# Patient Record
Sex: Female | Born: 1935 | Race: White | Hispanic: No | Smoking: Never smoker
Health system: Southern US, Community
[De-identification: ages and names within clinical notes are randomized; demographics above are authoritative.]

## PROBLEM LIST (undated history)

## (undated) DIAGNOSIS — M810 Age-related osteoporosis without current pathological fracture: Secondary | ICD-10-CM

## (undated) DIAGNOSIS — E119 Type 2 diabetes mellitus without complications: Secondary | ICD-10-CM

## (undated) DIAGNOSIS — I7100 Dissection of unspecified site of aorta: Secondary | ICD-10-CM

## (undated) DIAGNOSIS — J449 Chronic obstructive pulmonary disease, unspecified: Secondary | ICD-10-CM

## (undated) DIAGNOSIS — I10 Essential (primary) hypertension: Secondary | ICD-10-CM

## (undated) DIAGNOSIS — E785 Hyperlipidemia, unspecified: Secondary | ICD-10-CM

## (undated) DIAGNOSIS — N3020 Other chronic cystitis without hematuria: Secondary | ICD-10-CM

## (undated) DIAGNOSIS — I700 Atherosclerosis of aorta: Secondary | ICD-10-CM

## (undated) HISTORY — DX: Essential (primary) hypertension: I10

## (undated) HISTORY — DX: Type 2 diabetes mellitus without complications: E11.9

## (undated) HISTORY — DX: Dissection of unspecified site of aorta: I71.00

## (undated) HISTORY — DX: Chronic obstructive pulmonary disease, unspecified: J44.9

## (undated) HISTORY — PX: OTHER SURGICAL HISTORY: SHX169

## (undated) HISTORY — DX: Hyperlipidemia, unspecified: E78.5

## (undated) HISTORY — DX: Atherosclerosis of aorta: I70.0

## (undated) HISTORY — DX: Age-related osteoporosis without current pathological fracture: M81.0

## (undated) HISTORY — DX: Other chronic cystitis without hematuria: N30.20

---

## 1998-03-16 ENCOUNTER — Other Ambulatory Visit: Admission: RE | Admit: 1998-03-16 | Discharge: 1998-03-16 | Payer: Self-pay | Admitting: Obstetrics and Gynecology

## 2000-09-15 ENCOUNTER — Ambulatory Visit (HOSPITAL_COMMUNITY): Admission: RE | Admit: 2000-09-15 | Discharge: 2000-09-15 | Payer: Self-pay | Admitting: Family Medicine

## 2001-05-21 ENCOUNTER — Other Ambulatory Visit: Admission: RE | Admit: 2001-05-21 | Discharge: 2001-05-21 | Payer: Self-pay | Admitting: Obstetrics and Gynecology

## 2001-10-11 ENCOUNTER — Ambulatory Visit (HOSPITAL_COMMUNITY): Admission: RE | Admit: 2001-10-11 | Discharge: 2001-10-11 | Payer: Self-pay | Admitting: Gastroenterology

## 2003-08-25 ENCOUNTER — Encounter: Admission: RE | Admit: 2003-08-25 | Discharge: 2003-08-25 | Payer: Self-pay | Admitting: Family Medicine

## 2003-10-28 ENCOUNTER — Encounter: Admission: RE | Admit: 2003-10-28 | Discharge: 2003-10-28 | Payer: Self-pay | Admitting: Family Medicine

## 2004-06-15 ENCOUNTER — Ambulatory Visit (HOSPITAL_COMMUNITY): Admission: RE | Admit: 2004-06-15 | Discharge: 2004-06-15 | Payer: Self-pay | Admitting: Chiropractic Medicine

## 2005-10-04 ENCOUNTER — Encounter: Admission: RE | Admit: 2005-10-04 | Discharge: 2005-10-04 | Payer: Self-pay | Admitting: Gastroenterology

## 2006-07-28 ENCOUNTER — Encounter: Admission: RE | Admit: 2006-07-28 | Discharge: 2006-07-28 | Payer: Self-pay | Admitting: Family Medicine

## 2006-08-08 ENCOUNTER — Encounter: Admission: RE | Admit: 2006-08-08 | Discharge: 2006-08-08 | Payer: Self-pay | Admitting: Family Medicine

## 2006-08-15 ENCOUNTER — Encounter: Admission: RE | Admit: 2006-08-15 | Discharge: 2006-08-15 | Payer: Self-pay | Admitting: Family Medicine

## 2006-09-01 ENCOUNTER — Ambulatory Visit: Payer: Self-pay | Admitting: Cardiothoracic Surgery

## 2006-09-11 ENCOUNTER — Ambulatory Visit (HOSPITAL_COMMUNITY): Admission: RE | Admit: 2006-09-11 | Discharge: 2006-09-11 | Payer: Self-pay | Admitting: Cardiothoracic Surgery

## 2006-09-18 ENCOUNTER — Ambulatory Visit: Payer: Self-pay | Admitting: Cardiothoracic Surgery

## 2006-10-05 ENCOUNTER — Encounter: Admission: RE | Admit: 2006-10-05 | Discharge: 2006-10-05 | Payer: Self-pay | Admitting: Cardiovascular Disease

## 2006-10-06 ENCOUNTER — Inpatient Hospital Stay (HOSPITAL_COMMUNITY): Admission: EM | Admit: 2006-10-06 | Discharge: 2006-10-16 | Payer: Self-pay | Admitting: Emergency Medicine

## 2006-10-06 ENCOUNTER — Ambulatory Visit: Payer: Self-pay | Admitting: Cardiothoracic Surgery

## 2006-10-09 ENCOUNTER — Encounter (INDEPENDENT_AMBULATORY_CARE_PROVIDER_SITE_OTHER): Payer: Self-pay | Admitting: Cardiovascular Disease

## 2006-10-10 ENCOUNTER — Encounter (INDEPENDENT_AMBULATORY_CARE_PROVIDER_SITE_OTHER): Payer: Self-pay | Admitting: Specialist

## 2006-10-20 ENCOUNTER — Ambulatory Visit: Payer: Self-pay | Admitting: Cardiothoracic Surgery

## 2006-11-03 ENCOUNTER — Ambulatory Visit: Payer: Self-pay | Admitting: Vascular Surgery

## 2006-11-21 ENCOUNTER — Encounter: Admission: RE | Admit: 2006-11-21 | Discharge: 2006-11-21 | Payer: Self-pay | Admitting: Cardiothoracic Surgery

## 2006-11-24 ENCOUNTER — Ambulatory Visit: Payer: Self-pay | Admitting: Cardiothoracic Surgery

## 2006-12-07 ENCOUNTER — Inpatient Hospital Stay (HOSPITAL_COMMUNITY): Admission: EM | Admit: 2006-12-07 | Discharge: 2006-12-08 | Payer: Self-pay | Admitting: Emergency Medicine

## 2006-12-12 ENCOUNTER — Emergency Department (HOSPITAL_COMMUNITY): Admission: EM | Admit: 2006-12-12 | Discharge: 2006-12-13 | Payer: Self-pay | Admitting: *Deleted

## 2007-04-06 ENCOUNTER — Ambulatory Visit: Payer: Self-pay | Admitting: Cardiothoracic Surgery

## 2007-04-06 ENCOUNTER — Encounter: Admission: RE | Admit: 2007-04-06 | Discharge: 2007-04-06 | Payer: Self-pay | Admitting: Cardiothoracic Surgery

## 2007-07-18 ENCOUNTER — Ambulatory Visit: Payer: Self-pay | Admitting: Vascular Surgery

## 2007-07-25 ENCOUNTER — Encounter: Admission: RE | Admit: 2007-07-25 | Discharge: 2007-07-25 | Payer: Self-pay | Admitting: Vascular Surgery

## 2007-07-25 ENCOUNTER — Ambulatory Visit: Payer: Self-pay | Admitting: Vascular Surgery

## 2007-10-12 ENCOUNTER — Encounter: Admission: RE | Admit: 2007-10-12 | Discharge: 2007-10-12 | Payer: Self-pay | Admitting: Cardiothoracic Surgery

## 2007-10-19 ENCOUNTER — Ambulatory Visit: Payer: Self-pay | Admitting: Cardiothoracic Surgery

## 2008-03-27 ENCOUNTER — Encounter (INDEPENDENT_AMBULATORY_CARE_PROVIDER_SITE_OTHER): Payer: Self-pay | Admitting: Orthopedic Surgery

## 2008-03-27 ENCOUNTER — Ambulatory Visit (HOSPITAL_BASED_OUTPATIENT_CLINIC_OR_DEPARTMENT_OTHER): Admission: RE | Admit: 2008-03-27 | Discharge: 2008-03-27 | Payer: Self-pay | Admitting: Orthopedic Surgery

## 2008-04-14 ENCOUNTER — Ambulatory Visit: Payer: Self-pay | Admitting: *Deleted

## 2008-04-14 ENCOUNTER — Ambulatory Visit: Admission: RE | Admit: 2008-04-14 | Discharge: 2008-04-14 | Payer: Self-pay | Admitting: Orthopedic Surgery

## 2008-04-14 ENCOUNTER — Encounter (INDEPENDENT_AMBULATORY_CARE_PROVIDER_SITE_OTHER): Payer: Self-pay | Admitting: Orthopedic Surgery

## 2008-11-07 ENCOUNTER — Encounter: Admission: RE | Admit: 2008-11-07 | Discharge: 2008-11-07 | Payer: Self-pay | Admitting: Cardiothoracic Surgery

## 2008-11-10 ENCOUNTER — Ambulatory Visit: Payer: Self-pay | Admitting: Cardiothoracic Surgery

## 2009-04-18 ENCOUNTER — Inpatient Hospital Stay (HOSPITAL_COMMUNITY): Admission: EM | Admit: 2009-04-18 | Discharge: 2009-04-20 | Payer: Self-pay | Admitting: Cardiovascular Disease

## 2009-04-18 ENCOUNTER — Ambulatory Visit: Payer: Self-pay | Admitting: Thoracic Surgery (Cardiothoracic Vascular Surgery)

## 2009-04-18 ENCOUNTER — Encounter: Payer: Self-pay | Admitting: Emergency Medicine

## 2009-05-15 ENCOUNTER — Ambulatory Visit: Payer: Self-pay | Admitting: Cardiothoracic Surgery

## 2009-05-15 ENCOUNTER — Encounter: Admission: RE | Admit: 2009-05-15 | Discharge: 2009-05-15 | Payer: Self-pay | Admitting: Cardiovascular Disease

## 2009-10-28 ENCOUNTER — Encounter
Admission: RE | Admit: 2009-10-28 | Discharge: 2009-10-28 | Payer: Self-pay | Source: Home / Self Care | Admitting: Cardiothoracic Surgery

## 2009-10-29 ENCOUNTER — Ambulatory Visit: Payer: Self-pay | Admitting: Cardiothoracic Surgery

## 2010-06-17 ENCOUNTER — Emergency Department (HOSPITAL_COMMUNITY)
Admission: EM | Admit: 2010-06-17 | Discharge: 2010-06-17 | Payer: Self-pay | Source: Home / Self Care | Admitting: Emergency Medicine

## 2010-07-17 ENCOUNTER — Encounter: Payer: Self-pay | Admitting: Chiropractic Medicine

## 2010-07-18 ENCOUNTER — Encounter: Payer: Self-pay | Admitting: Cardiothoracic Surgery

## 2010-09-30 LAB — CBC
HCT: 38.1 % (ref 36.0–46.0)
HCT: 38.8 % (ref 36.0–46.0)
Hemoglobin: 13.2 g/dL (ref 12.0–15.0)
Hemoglobin: 13.6 g/dL (ref 12.0–15.0)
Hemoglobin: 14.9 g/dL (ref 12.0–15.0)
MCHC: 34 g/dL (ref 30.0–36.0)
MCHC: 34.7 g/dL (ref 30.0–36.0)
Platelets: 158 10*3/uL (ref 150–400)
Platelets: 196 10*3/uL (ref 150–400)
RDW: 13.1 % (ref 11.5–15.5)
RDW: 13.4 % (ref 11.5–15.5)
WBC: 8.5 10*3/uL (ref 4.0–10.5)

## 2010-09-30 LAB — HEMOGLOBIN A1C
Hgb A1c MFr Bld: 6.1 % (ref 4.6–6.1)
Mean Plasma Glucose: 128 mg/dL

## 2010-09-30 LAB — DIFFERENTIAL
Basophils Absolute: 0 10*3/uL (ref 0.0–0.1)
Eosinophils Absolute: 0.4 10*3/uL (ref 0.0–0.7)
Eosinophils Relative: 3 % (ref 0–5)
Lymphocytes Relative: 29 % (ref 12–46)
Monocytes Absolute: 1.1 10*3/uL — ABNORMAL HIGH (ref 0.1–1.0)

## 2010-09-30 LAB — BASIC METABOLIC PANEL
BUN: 19 mg/dL (ref 6–23)
CO2: 25 mEq/L (ref 19–32)
Calcium: 9.2 mg/dL (ref 8.4–10.5)
Creatinine, Ser: 0.71 mg/dL (ref 0.4–1.2)
GFR calc non Af Amer: >60 mL/min (ref >60)
Glucose, Bld: 119 mg/dL — ABNORMAL HIGH (ref 70–99)
Sodium: 133 mEq/L — ABNORMAL LOW (ref 135–145)

## 2010-09-30 LAB — HEPARIN LEVEL (UNFRACTIONATED): Heparin Unfractionated: 0.5 IU/mL (ref 0.30–0.70)

## 2010-09-30 LAB — CK TOTAL AND CKMB (NOT AT ARMC)
CK, MB: 1.7 ng/mL (ref 0.3–4.0)
Total CK: 69 U/L (ref 7–177)

## 2010-09-30 LAB — COMPREHENSIVE METABOLIC PANEL
ALT: 18 U/L (ref 0–35)
Albumin: 3.2 g/dL — ABNORMAL LOW (ref 3.5–5.2)
Alkaline Phosphatase: 44 U/L (ref 39–117)
BUN: 14 mg/dL (ref 6–23)
Chloride: 101 mEq/L (ref 96–112)
Glucose, Bld: 121 mg/dL — ABNORMAL HIGH (ref 70–99)
Potassium: 4 mEq/L (ref 3.5–5.1)
Sodium: 137 mEq/L (ref 135–145)
Total Bilirubin: 0.8 mg/dL (ref 0.3–1.2)
Total Protein: 6.6 g/dL (ref 6.0–8.3)

## 2010-09-30 LAB — BRAIN NATRIURETIC PEPTIDE: Pro B Natriuretic peptide (BNP): 104 pg/mL — ABNORMAL HIGH (ref 0.0–100.0)

## 2010-09-30 LAB — URINALYSIS, ROUTINE W REFLEX MICROSCOPIC
Nitrite: NEGATIVE
Specific Gravity, Urine: 1.012 (ref 1.005–1.030)
Urobilinogen, UA: 0.2 mg/dL (ref 0.0–1.0)
pH: 7 (ref 5.0–8.0)

## 2010-09-30 LAB — URINE MICROSCOPIC-ADD ON

## 2010-09-30 LAB — CARDIAC PANEL(CRET KIN+CKTOT+MB+TROPI)
CK, MB: 1 ng/mL (ref 0.3–4.0)
CK, MB: 1.4 ng/mL (ref 0.3–4.0)
Relative Index: INVALID (ref 0.0–2.5)
Total CK: 45 U/L (ref 7–177)
Troponin I: 0.01 ng/mL (ref 0.00–0.06)
Troponin I: 0.02 ng/mL (ref 0.00–0.06)

## 2010-09-30 LAB — LIPID PANEL
HDL: 51 mg/dL (ref >39)
Total CHOL/HDL Ratio: 3.3 RATIO
VLDL: 31 mg/dL (ref 0–40)

## 2010-09-30 LAB — SEDIMENTATION RATE: Sed Rate: 26 mm/hr — ABNORMAL HIGH (ref 0–22)

## 2010-09-30 LAB — PROTIME-INR: Prothrombin Time: 12 seconds (ref 11.6–15.2)

## 2010-09-30 LAB — C-REACTIVE PROTEIN: CRP: 3.3 mg/dL — ABNORMAL HIGH (ref <0.6)

## 2010-09-30 LAB — TROPONIN I: Troponin I: 0.01 ng/mL (ref 0.00–0.06)

## 2010-10-01 ENCOUNTER — Other Ambulatory Visit: Payer: Self-pay | Admitting: Cardiothoracic Surgery

## 2010-10-01 DIAGNOSIS — I712 Thoracic aortic aneurysm, without rupture: Secondary | ICD-10-CM

## 2010-11-03 ENCOUNTER — Ambulatory Visit
Admission: RE | Admit: 2010-11-03 | Discharge: 2010-11-03 | Disposition: A | Discharge status: Home / Self Care | Payer: Medicare Other | Source: Ambulatory Visit | Attending: Cardiothoracic Surgery | Admitting: Cardiothoracic Surgery

## 2010-11-03 ENCOUNTER — Ambulatory Visit (INDEPENDENT_AMBULATORY_CARE_PROVIDER_SITE_OTHER): Payer: Medicare Other | Admitting: Cardiothoracic Surgery

## 2010-11-03 DIAGNOSIS — I712 Thoracic aortic aneurysm, without rupture: Secondary | ICD-10-CM

## 2010-11-03 MED ORDER — IOHEXOL 300 MG/ML  SOLN
100.0000 mL | Freq: Once | INTRAMUSCULAR | Status: AC | PRN
  Administered 2010-11-03: 100 mL via INTRAVENOUS

## 2010-11-04 NOTE — Assessment & Plan Note (Signed)
OFFICE VISIT  [name], [name] DOB:  1923-11-22                                        Nov 03, 2010 CHART #:  [id number]  CURRENT PROBLEMS: 1. Status post resection of a 6-cm ascending thoracic aneurysm with     Hemashield graft and hemiarch reconstruction with preservation of     aortic valve, April 2008. 2. 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 Kellie Simmering. 3. Type [name] intramural hematoma of the descending thoracic aorta,     October 2010, now resolved. 4. Hypertension.  PRESENT ILLNESS:  The patient is a 77 year old very nice lady who returns for follow up of CT angiogram of her thoracic aorta after undergoing the above thoracic aortic repair for a large ascending aneurysm.  She is doing fairly well at home, but has arthritis and recently injured her left shoulder rotator cuff up.  Blood pressure has been under fairly good control.  She is stopped the prednisone under the direction of Dr. Kellie Simmering for her giant cell arteritis and has lost of significant weight.  She has some issues with ankle swelling and otherwise takes Lipitor, 81 mg of aspirin, fish oil, Lasix 20 mg, metoprolol 50 mg a day, and WelChol.  PHYSICAL EXAMINATION:  Vital Signs:  Her blood pressure is 140/70, pulse 72 and regular, respirations 18 and regular, saturation 97%.  Lungs: Breath sounds are clear and equal.  Cardiac:  Rhythm is regular without murmur.  The sternal incision is well healed and she has no significant pedal edema.  The aortic valve sounds to be competent without murmur.  A CT angiogram shows her thoracic aortic appeared to be intact.  No evidence of pseudoaneurysm or new aneurysmal disease of the ascending arch or descending thoracic aorta.  PLAN:  The patient is now 4 years after surgery, doing well.  Further surveillance CT scans I feel are necessary, would only entail more radiation.  Blood pressure control and p.r.n. followup here  are recommended.  Kerin Perna, M.D. Electronically Signed  PV/MEDQ  D:  11/03/2010  T:  11/04/2010  Job:  [id number]  cc:   Aundra Dubin, M.D. Sandi Carne, MD

## 2010-11-09 NOTE — Discharge Summary (Signed)
[name], [name]                ACCOUNT NO.:  [id number]   MEDICAL RECORD NO.:  [id number]          PATIENT TYPE:  INP   LOCATION:  1426                         FACILITY:  [facility]   PHYSICIAN:  [name], MDDATE OF BIRTH:  [birth year]   DATE OF ADMISSION:  12/06/2006  DATE OF DISCHARGE:  12/08/2006                               DISCHARGE SUMMARY   DISCHARGE DIAGNOSES:  1. Urinary tract infection.  2. Dehydration.  3. Hypokalemia.  4. Hyponatremia.  5. Hypertension.  6. Mild sinus bradycardia, resolved.  7. History of atrial fibrillation.  8. Giant cell aortitis with history of resection of an ascending      aortic aneurysm with graft and hemiarch reconstruction.  9. Recently diagnosed diabetes, not on medication.  10.Hyperlipidemia.   DISCHARGE MEDICATIONS:  1. Levaquin 500 mg orally daily for five more days.  2. The patient may continue her outpatient medications; please see      admitting history and physical for complete details.   DISCHARGE CONDITION:  Condition is stable.   CONSULTATIONS:  None.   PROCEDURE:  None.   DIET:  Low-cholesterol with adequate oral intake.   FOLLOW UP:  Follow up is with Dr. Donia Guiles in twp weeks for  routine hospital follow up.   LABORATORY DATA:  Pertinent labs:  CBC on admission was unremarkable,  but the following day her white blood cell count was 12,000.  Sodium  130, potassium 3.3, chloride 95, bicarbonate 20, glucose 168, BUN 22,  and creatinine 0.87.  Albumin 2.7.  Otherwise unremarkable comprehensive  metabolic panel.  UA showed cloudy urine, specific gravity 1.015 with 40  ketones, small blood, 30 protein, nitrite positive, trace leukocyte  esterase, 7-10 white cells, 3-6 red cells, and many bacteria.  Hemoccult  of the stool was negative.  Blood cultures negative to date.  Urine  culture negative to date.   HISTORY AND HOSPITAL COURSE:  The patient was a pleasant 77 year old  white female who presented to  the emergency room with generalized  malaise, subjective fevers,  and rigors.  She had not been eating well.  She had been nauseated, but not vomiting.  She lives alone.  She denied  any dysuria or frequency.  She was on prednisone for her giant cell  aortitis.   Initial temperature was 102.7.  Heart rate on admission was 57 and her  blood pressure initially was 170/72, but dropped into the 90s systolic  the day after admission and her metoprolol was held.  She was to have a  urinary tract infection and was started on Rocephin.  Cultures to date  have been negative.  She, at the time of discharge, was feeling much  better.  Her blood pressure and heart rate normalized; and, she  requested to go home.  She is tolerating a regular diet.      Mikayla L. Lendell [name], MD  Electronically Signed     CLS/MEDQ  D:  12/08/2006  T:  12/09/2006  Job:  [id number]

## 2010-11-09 NOTE — Assessment & Plan Note (Signed)
OFFICE VISIT   Mikayla [name], Mikayla [name]  DOB:  July 26, 1923                                        Oct 29, 2009  CHART #:  [id number]   CURRENT PROBLEMS:  1. Status post resection of a large ascending thoracic aortic aneurysm      with a Hemashield graft hemiarch reconstruction and preservation of      the aortic valve, April 2008.  2. Type [name] intramural hematoma of the descending thoracic aorta,      October 2010, now resolved.  3. Giant cell aortitis of the thoracic aorta by pathology of resected      specimen, on intermittent prednisone followed by Dr. Kellie Simmering.  4. Hypertension.   PRESENT ILLNESS:  The patient is an 77 year old Caucasian hypertensive  female, returns for 67-month followup.  She was hospitalized last October  for a intramural hematoma of the descending thoracic aorta, which was  probably the result of a penetrating ulcer in a short segment  dissection.  Fortunately, this has resolved on serial CT scans, the last  of which was performed yesterday.  Her ascending aortic graft is intact  and the size of her ascending aorta arch and descending thoracic aorta  are normal.  She is followed carefully by Dr. Kellie Simmering for her giant cell  arteritis and recently (3 weeks ago) discontinued low-dose prednisone.  She has been able to lose some weight since that time.  She is having  difficulty with the left knee arthritis and received a steroid injection  by Dr. Renae Fickle and is able to walk short distances.  Her blood pressure  remains under fairly good control, although since her last visit, her  metoprolol dose has been increased from 25 to 50 mg p.o. [name].i.d. and her  Lipitor has been increased from 10 to 20 mg [name].i.d.  She remains on  Lasix, aspirin 81 mg, fish oil, and WelChol.   PHYSICAL EXAMINATION:  Blood pressure 145/80, pulse 67, respirations 18,  and saturation 95% on room air.  She is a very strong-appearing elderly  female, who has slight cushingoid  features.  Her neck is without JVD and  palpable carotid pulses are present.  Her sternal incision is well  healed and she has a normal-sounding aortic valve closure without  murmur.  Peripheral pulses are 2+ in her upper and lower extremities.  She has no pulsatile mass in her abdomen.   LABORATORY DATA:  Her CT scan of the chest shows resolution of the  previously described descending thoracic aortic intramural hematoma.  The ascending aortic graft is intact.  She has some atheromatous changes  of the abdominal aorta, but no aneurysm.  The patient has small  gallstones also noted on the abdominal portion in her CT scan.   IMPRESSION AND PLAN:  The patient's thoracic aortic disease is stable on  her beta-blocker, statin, and aspirin.  I have encouraged her to enter  into a low-impact exercise program with a reclining bicycle or water,  exercise to help with her weight loss, and maintain her conditioning.  I  will plan on seeing her back in 1 year with a CT scan of the thoracic  aorta.  Hopefully, she will be able to maintain her vascular health  without prednisone and the associated weight gain, which has been  problematic.   Mikayla Nations  Maudie [name], M.D.  Electronically Signed   PV/MEDQ  D:  10/29/2009  T:  10/30/2009  Job:  [id number]   cc:   Mikayla Dubin, MD  Mikayla Carne, MD

## 2010-11-09 NOTE — Assessment & Plan Note (Signed)
OFFICE VISIT   [name], [name]  DOB:  [birth year]                                        May 15, 2009  CHART #:  [id number]   CURRENT PROBLEMS:  1. Status post resection of a large ascending arch aneurysm with a      Hemashield graft in April 2008.  2. 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 Kellie Simmering.  3. Type [name] intramural hematoma of the descending thoracic aorta,      October 2010.  4. Hypertension.   PRESENT ILLNESS:  The patient returns for followup of her thoracic  aortic disease.  Her ascending aneurysm was replaced with a Hemashield  graft using circulatory arrest in 2010.  She has been on prednisone and  followed carefully by Dr. Kellie Simmering.  She has been on beta-blocker  chronically.  In October of this year, she presented with acute  epigastric pain and severe hypertension.  Her sed rate was 24.  She had  a new intramural hematoma of the descending thoracic aorta with an  apparent penetrating ulcer.  Fortunately, her symptoms resolved and she  had no complications from the penetrating ulcer and this did not develop  into a full dissection.  She has been home now for a few weeks without  severe hypertension and she has been limiting her activity levels.  She  was placed back on prednisone by Dr. Kellie Simmering.  She still has some  epigastric discomfort after meals but this has improved especially with  a eating more frequent smaller meal.  She denies any exertional chest  pain or epigastric pain during activities such as walking or going  upstairs.  Her coronaries were without significant disease when she was  cathed by Dr. Allyson Sabal in 2008.  She is maintaining sinus rhythm, and she  continues on her medications including aspirin 81 mg, fish oil, WelChol,  metoprolol 50 mg daily, and Lasix 40 mg daily.   PHYSICAL EXAMINATION:  Blood pressure 146/70, pulse 65, respirations 18,  saturation 96% on room air.   She is alert and comfortable.  Breath  sounds are clear.  Cardiac rhythm is regular without murmur.  Previous  echoes have shown no evidence of significant aortic valvular disease.  She has good distal pulses and no neurologic deficit.  She has nontender  abdomen without palpable pulsatile mass.   LABORATORY DATA:  CT angiogram is repeated today.  The study has not  been officially interpreted, but the penetrating ulcer seems to have  resolved and the periaortic hematoma is also slightly improved.  There  is no evidence of true dissection or false lumen.  There is no  significant pleural effusion.   IMPRESSION AND PLAN:  The patient has a descending thoracic aortic  hematoma from a small penetrating ulcer.  This has responded well to  medical therapy.  I told her she could start doing some increased  activity but not lifting anything more than 20 pounds.  She is a very  active woman.  She will remain on her current medications and her  prednisone will be directed by Dr. Kellie Simmering.  I plan on seeing her back  in approximately 6 months with a CT scan to assess her thoracic aortic  disease.   [facility] [employer] [facility],  M.D.  Electronically Signed   PV/MEDQ  D:  05/15/2009  T:  05/16/2009  Job:  [id number]   cc:   Aundra Dubin, M.D.  Donia Guiles, M.D.

## 2010-11-09 NOTE — H&P (Signed)
[name], [name]                ACCOUNT NO.:  [id number]   MEDICAL RECORD NO.:  [id number]          PATIENT TYPE:  INP   LOCATION:  1426                         FACILITY:  [facility]   PHYSICIAN:  Kela Millin, M.D.DATE OF BIRTH:  02-Sep-1923   DATE OF ADMISSION:  12/06/2006  DATE OF DISCHARGE:  12/07/2006                              HISTORY & PHYSICAL   PRIMARY CARE PHYSICIAN:  Donia Guiles, M.D.   CHIEF COMPLAINT:  Fevers and generalized malaise.   HISTORY OF PRESENT ILLNESS:  The patient is an 77 year old white female  with past medical history significant for giant cell aortitis with an  ascending aortic aneurysm who is status post resection with graft and  hemiarch reconstruction per Kerin Perna, M.D. in April,  postoperative atrial fibrillation (to normal sinus rhythm on  amiodarone), left lower lobe pulmonary nodule with mild activity on PET  scan, hypertension, hyperlipidemia, and recently diagnosed diabetes  mellitus per family who presents with the above complaints.  It is noted  that the patient lives independently and had been in her usual state of  health doing well after the above mentioned surgery until about 2-3 days  ago when she developed generalized malaise and nausea.  On the p.m. of  presentation she was having chills/fevers when family saw her and so EMS  was called.  The patient denies abdominal pain, vomiting, diarrhea,  dysuria, melena, and no hematochezia.  She also denies chest pain,  shortness of breath, and no leg swelling.   She was seen in the ER and her temperature initially was 102.7 and her  urinalysis was consistent with a urinary tract infection.  She is  admitted to the [facility] service for further evaluation and  management.   PAST MEDICAL HISTORY:  As stated above.   MEDICATIONS:  1. Metoprolol 50 mg p.o. daily.  2. Lipitor 10 mg p.o. daily.  3. Welchol.  4. Acyclovir four times a day.  5. Prednisone 10 mg daily.  6. Amiodarone 200 mg daily.  7. Multivitamin.  8. Fish Oil.  9. Calcium.  10.Metformin.   The patient/family do not have the medication dosages at the time.  Those indicated above are from the [place] records.   ALLERGIES:   INTOLERANCES:  ACTONEL.   SOCIAL HISTORY:  Denies tobacco, also denies alcohol.   FAMILY HISTORY:  Noncontributory.   REVIEW OF SYSTEMS:  As per HPI.  Other review of systems negative.   PHYSICAL EXAMINATION:  GENERAL:  The patient is an elderly white female.  She is sleepy, arouses to voice briefly.  In no apparent distress.  VITAL SIGNS:  Temperature 96.8, initially 102.7, blood pressure 105/65,  initially 170/72, and pulse 57, initially 89, O2 saturation 97%.  HEENT:  PERRL, EOMI, dry mucous membranes, no oral exudates.  NECK:  Supple, no adenopathy, and no thyromegaly, and no JVD.  LUNGS:  Clear to auscultation bilaterally.  No crackles or wheezes.  CARDIOVASCULAR:  Regular rate and rhythm, normal S1 and S2.  ABDOMEN:  Soft.  Bowel sounds present, nontender, and nondistended.  No  organomegaly and no  masses palpable.  No CVA tenderness.  EXTREMITIES:  No cyanosis and no edema.   LABORATORY DATA:  Urinalysis is cloudy in appearance.  Urine nitrite is  positive, leukocyte esterase is trace.  Urine WBCs 7 to 10.  Sodium 130,  potassium 3.3, chloride 95, CO2 23, glucose 168, BUN 22, creatinine  0.87, total protein 6.2, albumin 2.7, AST 25.  White blood cell count  6.3 with a hemoglobin of 13.5, hematocrit 40, platelet count 158,  neutrophil count 91%.  Occult blood is negative.   ASSESSMENT:  1. Urinary tract infection - as discussed above, we will obtain blood      and urine cultures, start empiric antibiotics and follow.  2. Diabetes mellitus - per the patient's/family report diagnosed      recently.  We will monitor Accu-Cheks, cover with sliding scale      insulin, follow, and resume Metformin when tolerating p.o. well.  3. Volume  depletion/hyponatremia.  Hydrate and recheck.  4. Hypokalemia.  Replete potassium.  5. Hypertension.  Follow and resume outpatient medications.  6. History of postoperative atrial fibrillation.  In sinus rhythm, on      amiodarone.  We will continue.  7. Giant cell aortitis with ascending aortic aneurysm, status post      surgery.  We will continue prednisone.  The patient is followed by      rheumatologist.  We will also continue acyclovir.  8. Hyperlipidemia.  Continue outpatient medications.      Kela Millin, M.D.  Electronically Signed     ACV/MEDQ  D:  12/07/2006  T:  12/08/2006  Job:  [id number]   cc:   Donia Guiles, M.D.  Fax: [phone]   Kerin Perna, M.D.  [address]  [place]  [place] [address]

## 2010-11-09 NOTE — Assessment & Plan Note (Signed)
OFFICE VISIT   Mikayla, JASPERS [name]  DOB:  07-09-1923                                        Nov 24, 2006  CHART #:  [id number]   CURRENT PROBLEMS:  1. Status post resection of a 6 cm ascending aortic aneurysm with a 28      mm Hemashield graft with hemi-arch reconstruction on October 10, 2006.  2. Giant cell aortitis of the fusiform aneurysm on pathology, now on      prednisone.  3. Perioperative atrial fibrillation, now converted to sinus rhythm.  4. Left lower lobe pulmonary nodule, 1.8 cm, with mild activity on PET      scan (1.7 SUV).  5. A 5.5 cm fusiform abdominal aortic aneurysm.   PRESENT ILLNESS:  [name] returns for her first postoperative office  visit after undergoing resection and grafting of her ascending aorta and  hemi-arch reconstruction under circulatory arrest in mid April.  Because  of the pathology showing a giant cell aortitis, she has been placed on  prednisone and is currently on 25 mg daily.  She has developed some  cushingoid problems and some glucose intolerance and some fluid  retention.  Her aortic valve by echo preoperatively showed no AI or AS.  She has done well postoperatively and is anxious to increase her  activity level to include yard work, regular housework, and driving.   She remains on her post hospital discharge medications, including  aspirin 81 mg, Toprol-XL 25 mg, Lipitor, Welchol, prednisone, Amiodarone  (one 200 mg dose daily), and pain medication.  She denies any difficulty  with sternal incision or groin incision.   PHYSICAL EXAMINATION:  Vital signs:  Blood pressure 150/70, pulse 60,  respirations 18, saturation is 95%.  General:  She is alert and pleasant  and looks great.  Lungs:  Breath sounds are clear and equal.  Cardiac:  Rhythm is regular.  There is no cardiac murmur.  The sternum is stable  and well healed.  Extremities:  Peripheral pulses are present in all  extremities.  The left groin  incision is healed.  She has minimal ankle  edema.   LABORATORY DATA:  A PA and lateral chest x-ray shows a stable  mediastinum with well-aligned sternal wires, no pleural effusion.   IMPRESSION AND PLAN:  The patient has done well following resection and  grafting of the ascending aortic aneurysm.  She does have a small, left  lower lobe pulmonary nodule, which is probably benign but will be  followed carefully.  I told her that she could resume driving and normal  type activities but to avoid heavy lifting for another two months.  She  will remain on her current medications, and her prednisone will be  adjusted by Dr. Arvilla Market.  I will plan on seeing her back in six months  with a computerized tomography scan to evaluate the pulmonary nodule and  to evaluate her aortic repair.   Kerin Perna, M.D.  Electronically Signed   PV/MEDQ  D:  11/24/2006  T:  11/24/2006  Job:  [id number]   cc:   Nanetta Batty, M.D.  Donia Guiles, M.D.

## 2010-11-09 NOTE — Procedures (Signed)
VASCULAR LAB EXAM   INDICATION:  Rule out popliteal aneurysm.   HISTORY:  Diabetes:  No.  Cardiac:  Thoracic aneurysm repair on 10/10/06 by Dr. Donata Clay.  Hypertension:  Yes.   EXAM:  Duplex, both popliteal arteries.   IMPRESSION:  1. Duplex imaging of both popliteal arteries reveal diameters within      normal limits bilaterally.  Both popliteal and posterior tibial      artery signals are within normal limits.  2. Right popliteal artery measured 0.82 cm anteroposterior X 0.96 cm      transverse.  3. Left popliteal artery measured approximately 0.55 cm      anteroposterior X 0.69 cm transverse.   ___________________________________________  Janetta Hora. Darrick Penna, MD   DP/MEDQ  D:  07/26/2007  T:  07/26/2007  Job:  [id number]

## 2010-11-09 NOTE — Op Note (Signed)
[name], [name]                ACCOUNT NO.:  [id number]   MEDICAL RECORD NO.:  [id number]          PATIENT TYPE:  AMB   LOCATION:  [facility]                         FACILITY:  [facility]   PHYSICIAN:  Deidre Ala, M.D.    DATE OF BIRTH:  05-Sep-1923   DATE OF PROCEDURE:  03/27/2008  DATE OF DISCHARGE:                               OPERATIVE REPORT   PREOPERATIVE DIAGNOSES:  1. Degenerative joint disease left knee with medial and lateral      meniscus tears.  2. Large Baker's cyst posterior medial.   POSTOPERATIVE DIAGNOSES:  1. Significant degenerative medial and lateral meniscus tears.  2. Tricompartmental OA grade III-IV.  3. Medial and lateral plicas.  4. Large Baker's cyst posterior medial.   PROCEDURE:  1. Left knee operative arthroscopy with partial medial and lateral      meniscectomies.  2. Abrasion ablation chondroplasties tricompartmental.  3. Lateral retinacular release.  4. Plica excision.  5. Separate incision, reprep, redrape excision large Baker cyst left      posterior medial knee.   SURGEON:  1. Charlesetta Shanks, M.D.   ASSISTANT:  Phineas Semen, P.A.   ANESTHESIA:  General endotracheal.   CULTURES:  None.   DRAINS:  None.   SPECIMEN:  Baker's cyst.   ESTIMATED BLOOD LOSS:  50 mL.  Replacement without.   TOURNIQUET TIME:  1 hour 10 minutes.   PATHOLOGIC FINDINGS AND HISTORY:  [name] is an 77 year old patient  of mine who underwent right knee arthroscopy and knee replacement  ultimately in the past.  The left knee is causing her discomfort but  most of the pain is posteriorly.  MRI scan showed a significant  posterior Baker's cyst, large size and was very tense and painful.  She  had tricompartmental DJD but not completely bone on bone.  I felt it  contraindicated to do the Baker's cyst and a total knee at the same time  so it was felt that she should be scoped from the front, debrided and  then turned over and the Baker's cyst excised.  At surgery  we found  tricompartmental DJD grade III-IV.  We found degenerative medial and  lateral meniscus tears of significance and we found large plicas and a  tight lateral retinaculum.  All of these were addressed then we flipped  the patient over, reprepped and draped and took out a tense Baker's cyst  measuring about of 7 x 4 x4 cm.  It decompressed.  We sent it for  pathologic evaluation but it was a classic Baker's cyst on the posterior  medial knee to the joint line.   PROCEDURE IN DETAIL:  With adequate anesthesia obtained using  endotracheal technique, 1 gram Ancef given IV prophylaxis and another  one at the second portion of the procedure, the patient was placed in  the supine position.  The left lower extremity was prepped from the  malleoli to the leg holder in the standard fashion.  After standard  prepping and draping Esmarch examination was used.  The tourniquet was  let up to 350 mmHg.  Superior  and lateral inflow portal were made.  The  knee was insufflated with normal saline with the arthroscopic pump.  Medial and lateral scope portals were then made and the joint was  thoroughly inspected.  We then shaved the plica back to the sidewall and  lysed the medial band.  I then debrided the medial meniscus with basket  and shaver and smoothed with the ablator on 1.  Shaving and ablation was  used on the medial femoral condyle and medial compartment to smooth the  joint line.  I then smoothed the trochlea and posterior patella.  Portals reversed and similar shavings carried out on a stellate complex  lateral meniscus tear to the rim and abrasion ablation chondroplasty  carried out on the lateral joint surface.  I then shaved out the lateral  plica, observed tilt and track and did an arthroscopic lateral  retinacular release from the vastus lateralis to the joint line.  I then  smoothed the posterior patella.  I then irrigated the knee through the  scope.  Marcaine 0.5% with morphine  was injected about the joint.  The  portals were closed with 4-0 nylon.   The patient then had a dressing placed temporarily, was turned prone  back to the stretcher then prone on chest rolls, reprepped and draped  from the ankle up into the tourniquet.  The tourniquet was left up.  An  incision was then made in the posterior popliteal space to the joint  line with a slight distal extension.  Incision was deepened sharply in  line and hemostasis obtained using the Bovie electrocoagulator.  Dissection was carried down to the Baker's cyst bluntly.  It was  dissected out of the medial hamstrings and excised at its base.  Irrigation was carried out.  The tourniquet was let down.  There were no  significant bleeding points.  The wound was then closed in layers with 2-  0 and 3-0 Vicryl on the subcu and skin staples.  Bulky sterile  compressive dressing was applied with 0.5% Marcaine in and about the  wound.  The patient having tolerated the procedure well was awakened,  taken to the recovery room in satisfactory condition to be discharged  per outpatient routine, crutches weightbearing as tolerated.  Told to  call the office for recheck on Saturday.  Given Percocet for pain.  Laboratory data within normal limits.           ______________________________  V. Charlesetta Shanks, M.D.     VEP/MEDQ  D:  03/27/2008  T:  03/28/2008  Job:  [id number]   cc:   Nanetta Batty, M.D.  Fax: [phone]   Donia Guiles, M.D.  Fax: [phone]

## 2010-11-09 NOTE — Assessment & Plan Note (Signed)
OFFICE VISIT   DERRICK, ORRIS  DOB:  09/12/1923                                       07/18/2007  ZOXWR#:[id number]   The patient is an 77 year old female referred by Dr. Arvilla Market for  evaluation of abdominal aortic aneurysm.  She previously had an  ascending aortic aneurysm fixed by Dr. Donata Clay in April 2008.  She has  a history of giant cell arteritis.  She recently had some symptoms of  abdominal pain, which she felt was a stretching or explosive sensation  in her abdomen.  There was mention of an abdominal aortic aneurysm on a  previous PET scan done for a lung nodule.  She is referred for further  evaluation of abdominal aortic aneurysm.  She has no family history of  aneurysm.  She has a history of hypertension and elevated cholesterol.  She current denies any abdominal or back pain.   PAST SURGICAL HISTORY:  As mentioned above.  She has also had a right  knee replacement and a hysterectomy.   PAST MEDICAL HISTORY:  Otherwise unremarkable.   MEDICATIONS:  Prednisone 5 mg 1 per day.  Furosemide 20 mg 1 per day.  Metoprolol 50 mg once a day.  Lipitor 10 mg once a day.  Welchol 625 mg  twice a day.  Fish oil once a day.  Vitamin once a day.  Calcium 500  b.i.d.  Aspirin 81 mg on Monday, Wednesday, Friday.  Protonix 40 mg once  a day.   She has no known drug allergies.   FAMILY HISTORY:  Remarkable for her mother who had diabetes and renal  failure.  Her father had coronary artery disease at a young age.  Her  sisters and brothers all have heart disease.   SOCIAL HISTORY:  She is widowed and has 3 children.  She is a nonsmoker.  Non-consumer of alcohol.  She is retired from [employer] where she worked  for 27 years.   REVIEW OF SYSTEMS:  She is 5 feet 4 inches, 169 pounds.  CARDIAC:  She denies history of chest pain or shortness of breath.  PULMONARY:  No history of asthma, wheezing, or COPD.  GASTROINTESTINAL:  No history of GI bleeding.  GENITOURINARY:  No renal dysfunction.  CARDIOVASCULAR:  No TIA or stroke.  NEURO:  No syncopal episodes.  ORTHO:  Mild joint and arthritis pain.  PSYCHIATRIC:  She is alert and oriented.  No depression or anxiety.  HENT:  Denies recent changes in hearing or eyesight.  HEMATOLOGICAL:  No history of bleeding or clotting disorders.   PHYSICAL EXAM:  Blood pressure is 155/71 in the left arm, heart rate is  73 and regular.  HEENT is unremarkable.  She has 2+ carotid pulses  without bruit.  Chest is clear to auscultation.  Cardiac exam is regular  rate and rhythm without murmur.  She has a well-healed median sternotomy  scar.  Abdomen:  Slightly obese, soft, and nontender, nondistended.  No  masses.  Normal bowel sounds.  She has 1+ femoral pulses bilaterally.  She has 2 to 3+ popliteal pulses bilaterally and these are quite full.  She has 2+ dorsalis pedis and posterior tibial pulses bilaterally.  She  has 2+ radial pulses bilaterally.  Neurologic, she has symmetric upper  extremity and lower extremity motor strength.   I reviewed her  radiologic studies in the [facility] system over the last 2  years.  I was able to find several studies of the thoracic aorta,  including several studies of her ascending aortic aneurysm.  I was able  to find a CT scan of the abdomen done 2 years ago, which showed no  evidence of abdominal aortic aneurysm.  She had a PET scan approximately  1 year ago, which mentions an abdominal aortic aneurysm.  However, I am  unable to see any aneurysm.  There are no images available of the  abdomen on this PET scan.   In light of her previous history of ascending aortic aneurysm and  mention of abdominal aortic aneurysm on a previous radiologic report  with no images available for review, I believe her best option is to  obtain a CT angiogram of the abdomen and pelvis to make sure that she  has no aneurysms within the abdomen.  Additionally, when she returns for  her followup  visit next week, we will do popliteal ultrasounds on her to  make sure that she has no evidence of popliteal aneurysms, since both  popliteal pulses were quite full on exam today.   Janetta Hora. Fields, MD  Electronically Signed   CEF/MEDQ  D:  07/19/2007  T:  07/19/2007  Job:  [id number]   cc:   Donia Guiles, M.D.

## 2010-11-09 NOTE — Assessment & Plan Note (Signed)
OFFICE VISIT   [name], PANT B  DOB:  10-29-1923                                        October 19, 2007  CHART #:  [id number]   CURRENT PROBLEMS:  1. Status post resection of an ascending/aortic arch aneurysm with a      Dacron graft, April 2008.  2. 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  3. Left lower lobe pulmonary nodule on prior CT scans, now resolved.  4. A 5.5-cm fusiform abdominal aortic aneurysm, followed by Dr.      Darrick Penna.   PRESENT ILLNESS:  The patient returns for a 1 year post-op followup with  a CT angiogram of thoracic aorta, after undergoing resection and  grafting of a 6-cm ascending aneurysm involving the aortic arch.  She  had a hemiarch reconstruction with a single Hemashield graft.  The  aortic valve was competent and she had no significant coronary disease.  She has been doing well, except for some weight gain associated with her  chronic prednisone therapy for her aortitis.  She remains on Lipitor,  metoprolol, aspirin, and Lasix.  She is still quite active in her garden  and traveling.   She denies any chest pain, shortness of breath, or incisional problems.   PHYSICAL EXAMINATION:  Vital signs:  Blood pressure 102/63, pulse 74,  respirations 18, saturation 96% on room air.  General:  She is alert and  oriented.  Lungs:  Breath sounds are clear.  Chest:  The sternum is well  healed.  She has no cardiac murmur or gallop.  Extremities:  Peripheral  pulses are intact and there is no edema.   LABORATORY DATA:  Her CT angiogram is reviewed.  The ascending aorta and  hemiarch reconstruction are normal.  There is a small side branch of the  graft which was used for a cannulation access that has been tied off  well above the sinotubular junction and this is part of the graft and is  a normal finding.  There is no pulmonary nodule present.  The thoracic  aorta otherwise shows some calcification but  is not aneurysmal.   PLAN:  The patient will be followed with an annual CT angiogram of the  thoracic aorta.  She is following her abdominal aneurysm apparently with  Dr. Fabienne Bruns.  Her aortitis and prednisone therapy are followed by  Dr. Shon Hough and Dr. Arvilla Market.   Kerin Perna, M.D.  Electronically Signed   PV/MEDQ  D:  10/19/2007  T:  10/19/2007  Job:  [id number]   cc:   Janetta Hora. Darrick Penna, MD  Donia Guiles, M.D.  Yaakov Guthrie. Shon Hough, M.D.

## 2010-11-09 NOTE — Op Note (Signed)
[name], [name]                ACCOUNT NO.:  [id number]   MEDICAL RECORD NO.:  [id number]          PATIENT TYPE:  EMS   LOCATION:  ED                           FACILITY:  [facility]   PHYSICIAN:  Dionne Ano. Gramig III, M.D.DATE OF BIRTH:  1924-03-08   DATE OF PROCEDURE:  12/12/2006  DATE OF DISCHARGE:  12/13/2006                               OPERATIVE REPORT   I had the pleasure to see [name] in the [facility] Emergency Room following the kind referral from Dr. Donnetta Hutching  in regards to upper extremity predicament.  This patient is an 77-year-  old female who recently underwent aortic surgery by Dr. Donata Clay.  She  was out hedge clipping today and sustained a open fracture with  displacement and nail bed disarray and soft tissue disruption (near  amputation) left ring finger.  She was given a tetanus and Ancef in the  emergency room and I was asked to see her.  I reviewed her past medical  and surgical history.  Medicines are reviewed.  Allergies are reviewed.  She has had recent aortic surgery.  She is here today with her daughter.   PHYSICAL EXAMINATION:  GENERAL APPEARANCE:  She is alert and oriented in  no acute distress.  VITAL SIGNS:  Stable.  EXTREMITIES:  She has normal on the shoulder, elbow and wrist exam.  The  left ring finger is notable for an open fracture dislocation about the  distal phalanx with significant disarray of the soft tissues, nail plate  and nail bed injury.   The PIP and MCP joints are normal.  There is no lymphangitis vascular  dysfunction.   I have reviewed her x-rays which show a comminuted complex distal  phalanx fracture.   IMPRESSION:  Comminuted complex open distal phalanx fracture left ring  finger.   PLAN:  I have consented her for I&D and repair structures as necessary.   PREOPERATIVE DIAGNOSIS:  Open comminuted complex distal phalanx fracture  with soft tissue disruption, nailbed and nail plate  injury.   POSTOPERATIVE DIAGNOSIS:  Open comminuted complex distal phalanx  fracture with soft tissue disruption, nailbed and nail plate injury.   PROCEDURES:  1. Nail plate removal left ring finger.  2. Nail bed repair, complex in nature, left ring finger.  3. I&D/excisional debridement open fracture left ring finger.  4. ORIF left ring finger distal phalanx.  5. Complex soft tissue repair, left ring finger secondary to near      amputation.   SURGEON:  Dionne Ano. Amanda Pea, M.D.   ASSISTANT:  None.   COMPLICATIONS:  None.   ANESTHESIA:  Peripheral nerve block.   TOURNIQUET TIME:  Less than an hour.   INDICATIONS FOR PROCEDURE:  This patient is a pleasant 77 year old  female who presents with the above-mentioned diagnosis after a hedge  clipping accident.  She understands the risks and benefits of surgery  and desires to proceed.   OPERATION:  The patient seen by myself and was given a intermetacarpal  block.  She was prepped and draped in the  usual  sterile fashion with  Betadine scrub and paint x2 followed by 3 liters of irrigant.  Following  this, sterile field was secured, tourniquet was insufflated and I&D of  skin, subcutaneous tissue, bone, nailbed and nail plate tissue was  accomplished.  This was an excisional debridement with removal of  nonviable tissue.  Following this, copious amounts of saline was placed  in the wound.   Once this done, nail plate removal was accomplished without difficulty.   Following this, I then reassembled the distal phalanx.  This was an open  reduction of the distal phalanx with approximation technique.  This  reduced nicely.   Following this, I then repaired the nailbed with 7-0 chromic suture  without difficulty.  This was a stellate laceration was repaired under 4  Loupe magnification.  It involved the sterile matrix.   Following this, I then performed a complex medial and lateral fold  repair with 4-0 chromic and 7-0 chromic  suture.  The patient tolerated  this well.   I then placed Adaptic under the eponychial fold.  Once this was  performed, I then performed sterile dressing application.  The patient  tolerated this well and there were no complicating features.  Once this  was performed, we then placed a finger splint, sterile dressing and  discussed her post procedure care.   I have recommend Augmentin 875 mg one p.o. b.i.d. x14 days as well as  Vicodin one to two q.4-6h. p.r.n. pain p.o. dispense #40 with refill as  necessary.  I have discussed her Peri-Colace use to prevent constipation  and vitamin C for wound healing.  I have asked her to notify us if any  problems.  Otherwise, I will see her in the office in seven days for  wound check.  We will make a DIP immobilization splint for the daytime  and a finger splint to include the hand at night.  We want to keep her  still for 4-6 weeks about the distal phalanx and then begin more  aggressive range of motion.  I have discussed the do's and don't's, etc.  and all questions have been encouraged and answered.  We will keep a  very close eye on her wound of course.           ______________________________  Dionne Ano. Everlene Other, M.D.     Nash Mantis  D:  12/13/2006  T:  12/13/2006  Job:  [id number]

## 2010-11-09 NOTE — Assessment & Plan Note (Signed)
OFFICE VISIT   [name], [name]  DOB:  [birth year]                                       07/25/2007  JYNWG#:[id number]   The patient returns for followup today after her CT of the abdomen and  pelvis.  I am pleased to report this showed no abdominal aortic  aneurysm. I spoke with Dr. Allyson Sabal from radiology.  Apparently, the  abdominal aortic aneurysm listed on her PET scan was a typographical  error.  She has had no further abdominal or back pain.  She had  bilateral popliteal ultrasound in our office today, which also showed no  evidence of popliteal aneurysm.   PHYSICAL EXAM:  Abdomen is soft, nontender, nondistended.  Blood  pressure is 145/72, pulse 67 and regular.  I am pleased to report that  this patient has no evidence of abdominal aortic or popliteal aneurysm.  She does not need further followup from our standpoint.  She will follow  up with Dr. Arvilla Market regarding her gastrointestinal symptoms, as to  whether or not her recent episodes may have been reflux-related.  A  carbon copy of the CT scan report also to Dr. Arvilla Market.   Janetta Hora. Fields, MD  Electronically Signed   CEF/MEDQ  D:  07/25/2007  T:  07/26/2007  Job:  [id number]   cc:   Donia Guiles, M.D.

## 2010-11-09 NOTE — Assessment & Plan Note (Signed)
OFFICE VISIT   [name], Mikayla B  DOB:  27-Jul-1923                                        Nov 10, 2008  CHART #:  [id number]   CURRENT PROBLEMS:  1. Status post resection of an ascending - arch aneurysm with a      Hemashield Dacron graft in April 2008.  2. 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  3. Left lower lobe pulmonary nodule on prior CT scan, resolved.  4. Hypertension.   The patient is now an 77 year old Caucasian female, who returns for her  2-year annual CT angiogram of the thoracic aorta.  She has had no  cardiac symptoms and is mainly bothered by arthritis in her right knee  and feels she will need a total knee replacement.  Prior to her  ascending aortic and arch aneurysm reconstruction, she had normal  coronaries and no significant valve disease.  She was followed by Dr.  Kellie Simmering in Rheumatology with prednisone, but now this has been  discontinued and her sed rates have been unremarkable.  The path on the  aortic wall did show aortitis.   Her current medications include Lipitor, aspirin, fish oil, metoprolol  50 mg, and Lasix.   PHYSICAL EXAMINATION:  VITAL SIGNS:  Blood pressure 145/80, pulse 70,  respirations 18, and saturation 96%.  GENERAL:  She is a delightful 77 year old female in no distress.  LUNGS:  Breath sounds are clear.  CHEST:  The sternum is stable and well healed.  CARDIAC:  She has no cardiac murmur, rub, or gallop.  EXTREMITIES:  No significant edema and pulses are intact.   LABORATORY DATA:  CT angiogram of thoracic aorta shows intact aortic  repair without pseudoaneurysm.  A small prominence of the ascending  aorta represents a side branch of the graft which was used for  cannulation and it is not a pseudoaneurysm.   Of note, there has been mentioned in her x-ray records of abdominal  aortic aneurysm; however, this was specifically ruled out with a CT scan  of  the abdomen and pelvis in 2008.   RECOMMENDATIONS:  The patient returns to the care of a cardiologist Dr.  Allyson Sabal and her primary care physician, Dr. Arvilla Market.  I doubt she will  need any further routine CT angiograms, now 2 years postop and doing  well.  I will see her back in 6 months with a chest x-ray to review her  blood pressure and symptoms.   Kerin Perna, M.D.  Electronically Signed   PV/MEDQ  D:  11/10/2008  T:  11/11/2008  Job:  [id number]   cc:   Nanetta Batty, M.D.  [facility]

## 2010-11-12 NOTE — H&P (Signed)
NAMEFLORIDA, NOLTON                ACCOUNT NO.:  [id number]   MEDICAL RECORD NO.:  [id number]          PATIENT TYPE:  INP   LOCATION:  1823                         FACILITY:  [facility]   PHYSICIAN:  Nanetta Batty, M.D.   DATE OF BIRTH:  1923-07-22   DATE OF ADMISSION:  10/06/2006  DATE OF DISCHARGE:                              HISTORY & PHYSICAL   CHIEF COMPLAINT:  Chest pain.   HISTORY OF PRESENT ILLNESS:  Ms. [name] is a pleasant 77 year old female  who was referred to Dr. Allyson Sabal by Dr. Arvilla Market.  She had apparently been  having some chest pain.  She had a CT scan as an outpatient on August 15, 2006.  This showed a mill centimeter ascending aortic aneurysm.  She  also had a 14 mm left lower lobe nodule.  She was referred to Dr. Donata Clay for further evaluation.  Dr. Donata Clay referred her to Dr. Allyson Sabal  for catheterization prior to aneurysm repair.  The patient was seen by  Dr. Allyson Sabal in the office yesterday, October 05, 2006.  She was set up for  an outpatient catheterization early next week.  Today she called the  office with complaints of chest pain.  She was sent to the emergency  room for further evaluation.  The patient describes a localized sharp  chest pain associated with some shortness of breath.  Onset was this  morning while at rest.  She also admits to a lot of epigastric  discomfort that sounds like gastroesophageal reflux.  She denies any  diaphoresis or jaw pain or arm pain.  She seen in the emergency room now  for further evaluation.   PAST MEDICAL HISTORY:  Remarkable for dyslipidemia, which is treated.  SHE HAS AN INTOLERANCE TO HIGH-DOSE STATINS.  She has had a previous  hysterectomy and right total knee replacement.  She has no other  significant medical problems.   CURRENT MEDICATIONS:  1. Lipitor 10 mg a day.  2. Welchol 625 three tablets b.i.d.  3. Metoprolol 50 mg a day.  4. Fish oil 2 capsules b.i.d.   ALLERGIES:  SHE HAS NO KNOWN DRUG ALLERGIES, BUT  DOES HAVE A INTOLERANCE  TO LIPITOR, SHE IS ALSO UNABLE TO TAKE ACTONEL.   SOCIAL HISTORY:  She is widowed with two children, six grandchildren,  six great-grandchildren.  She never smoked.   FAMILY HISTORY:  Unremarkable.   REVIEW OF SYSTEMS:  Essentially unremarkable except for noted above.  She does have a history of remote kidney stones, but these have not  bothered her in some time.  She has also had a history of renal cyst.  She has had some epigastric discomfort that is relieved with sitting up  and Tums, this has been going on for about a month..  She denies any  melena or history of GI bleeding.   PHYSICAL EXAMINATION:  Blood pressure 139/78, pulse 68, temperature  97.8, respirations 12.  GENERAL:  She is a well-developed, well-nourished female in no acute  distress.  HEENT:  Normocephalic.  Extraocular movements are intact.  Sclerae are  nonicteric.  Conjunctivae are within normal limits.  NECK:  Without bruit, without JVD.  CHEST:  Clear to auscultation and percussion.  CARDIAC EXAM:  Reveals regular rate and rhythm without obvious murmur,  rub or gallop.  There is no AI on exam.  ABDOMEN:  Nontender.  No hepatosplenomegaly.  She has a midline surgical  scar below the umbilicus.  EXTREMITIES:  With trace lower extremity edema with intact pulses and no  femoral bruits.  Upper extremity pulses are equal bilaterally.  NEURO:  Exam is grossly intact.  She is awake, alert and oriented,  cooperative.  Moves all extremities without obvious deficit.  SKIN:  Warm and dry.   EKG in the emergency room reveals sinus rhythm without acute changes.   IMPRESSION:  1. Chest pain, rule out coronary disease, rule out expansion of her      known ascending aortic aneurysm.  2. Dyslipidemia.  3. History of left lower lobe nodule, Dr. Morton Peters I believe was      going to remove this at the time of her aneurysm repair.   PLAN:  The patient be admitted to telemetry.  She will have a CT  scan in  the emergency room.  If this shows the ascending aneurysm to be  unchanged she will go ahead have a diagnostic catheterization.      Abelino Derrick, P.A.      Nanetta Batty, M.D.  Electronically Signed    LKK/MEDQ  D:  10/06/2006  T:  10/06/2006  Job:  [id number]

## 2010-11-12 NOTE — H&P (Signed)
[name], [name]                ACCOUNT NO.:  [id number]   MEDICAL RECORD NO.:  [id number]          PATIENT TYPE:  INP   LOCATION:  2306                         FACILITY:  [facility]   PHYSICIAN:  Judie Petit, M.D. DATE OF BIRTH:  1923/10/31   DATE OF ADMISSION:  10/06/2006  DATE OF DISCHARGE:                              HISTORY & PHYSICAL   TRANSESOPHAGEAL ECHOCARDIOGRAM REPORT:   HISTORY OF PRESENT ILLNESS:  Mrs. Spengler is an 77 year old female who  presents today for repair of ascending aortic aneurysm.  Transesophageal  echocardiogram probe will be utilized for assessment of aneurysm as well  as left ventricular function.   PROCEDURE:  On the morning of surgery, the patient was brought to the  post anesthesia care unit, where pulmonary artery and radial artery  lines were placed without difficulty under local anesthesia.  The  patient was subsequently taken to the operating room where general  anesthesia was carried out.  The transesophageal echocardiogram probe  was heavily lubricated and placed in a sleeve and placed on the  oropharynx without difficulty.  Prebypass report:  Left ventricle.   Left ventricular function was within normal limits.  Papillary muscles  were well outlined. There were no masses noted within the left  ventricular chamber.   Aorta:  The aorta was trileaflet in nature. There was no significant  aortic insufficiency on Doppler examination.  There was significant  ascending aortic aneurysm measuring 5.9 cm.   Mitral valve:  Mitral valve appeared to function appropriately.  There  was minimal mitral regurgitation on following Doppler examination.   Left atrium:  Left atrium appeared to be normal size and the atrial  septum was intact.  Left atrial appendage appeared normal.  There were  no masses noted within the appendage.   Right ventricle:  Overall right ventricular chamber within normal  limits.  Tricuspid valve appeared to function  appropriately.  There was  no significant tricuspid regurgitation on Doppler examination.   Post bypass examination:  After repair of the aortic aneurysm, the valve  still continues to function appropriately.  There does not appear to be  any significant aortic regurgitant flow.  Left ventricle appeared to  function appropriately.   Left ventricle:  Left ventricle appeared to function appropriately.  Left ventricle papillary muscles were well outlined.  Again, there were  no masses noted.  Initially there was decreased volume noted.  However  with replacement of volume, the contractile pattern improved as well.   There did not appear to be any other significant changes from prebypass  examination.   At the end of the procedure the transesophageal echocardiogram probe was  removed without difficulty.  The sleeve was intact.  The patient was  subsequently taken to the surgical intensive care unit in stable  condition.      Judie Petit, M.D.     CE/MEDQ  D:  10/10/2006  T:  10/11/2006  Job:  [id number]

## 2010-11-12 NOTE — Consult Note (Signed)
NAMEJEREMIAH, TARPLEY                ACCOUNT NO.:  [id number]   MEDICAL RECORD NO.:  [id number]          PATIENT TYPE:  INP   LOCATION:  4736                         FACILITY:  [facility]   PHYSICIAN:  Kerin Perna, M.D.  DATE OF BIRTH:  Apr 14, 1924   DATE OF CONSULTATION:  10/09/2006  DATE OF DISCHARGE:                                 CONSULTATION   REASON FOR CONSULTATION:  Fusiform abdominal aortic aneurysm, 5.5 cm.   CHIEF COMPLAINT:  Chest pain.   HISTORY OF PRESENT ILLNESS:  Mrs. Lucci is an 77 year old female who  presented to the office in March for evaluation of a newly discovered  ascending thoracic aortic aneurysm measuring 5.5 cm in diameter.  She  was scheduled to undergo preoperative cardiac catheterization by Dr.  Allyson Sabal to rule out coronary artery disease as she had calcifications of  the coronaries on her CT scan.  Before the procedure was performed, she  presented to the emergency department with chest pain.  A CT scan showed  no change in the ascending fusiform aneurysm and no dissection.  Cardiac  enzymes are negative.  She underwent urgent cardiac catheterization  which showed no significant coronary artery disease.  Her chest pain  improved with blood pressure control and bed rest.  A 2-D echo showed  fairly well preserved left ventricular function with EF of 55% and no  significant abnormality of the aortic valve.  There is no pericardial  effusion.  The patient is now hospitalized and prepared to proceed with  resection and grafting of her ascending fusiform aneurysm and possible  wedge resection of a left lower lobe pulmonary nodule seen on the CT  scan which measures 1.8 cm and has a low activity level by PET scan of  1.7 SUV.   PAST MEDICAL HISTORY:  1. Hypertension.  2. Hyperlipidemia.  3. Status post right total knee replacement.  4. 5.5 cm fusiform abdominal aortic aneurysm.  5. 1.8 cm left lower lobe nodule with positive activity on PET scan.  6.  History of herpes zoster after major surgical procedures over the      sacrum and buttock area.   HOME MEDICATIONS:  1. Lipitor 10 mg daily.  2. Welchol 625 mg b.i.d.  3. Metoprolol 50 mg a day.  4. Fish oil 2 capsules b.i.d.   SOCIAL HISTORY:  She is widowed with two children and six grandchildren.  She does not smoke or use alcohol.   FAMILY HISTORY:  Negative for thoracic or abdominal aneurysms.   REVIEW OF SYSTEMS:  Positive for kidney stones and renal cysts. Negative  for DVT, claudication, or TIA.  Previous head scan and MRA showed no  significant carotid artery stenoses.  She remains quite active and  handles her own affairs without difficulty.  She has no history of  diabetes, no history of deep venous thrombosis.  No bleeding disorder or  prior blood transfusion.   PHYSICAL EXAMINATION:  VITAL SIGNS:  She is 5 feet 2 inches, weighs 157  pounds.  Blood pressure 120/60, pulse 78, saturation 95% on room air.  LUNGS:  Breath sounds are clear and equal.  CHEST:  Without deformity.  CARDIAC:  Regular rhythm.  Without S3 gallop or murmur.  Dentition is  good.  Peripheral pulses are 2+ in all extremities.  NEUROLOGIC:  Alert and intact.   LABORATORY DATA:  We reviewed her CT scan, cardiac catheterization, 2-D  echo, and her lab work.   She has an atraumatic ascending aneurysm and will be scheduled for  elective repair on the morning of April 15.  I discussed the procedure  in detail including the location of the incision and the possible use of  hypothermic circulatory arrest if the arch is involved.  She understands  the alternatives to surgery and the associated risks of stroke, MI,  bleeding, blood transfusion requirement, multi organ failure, and death.      Kerin Perna, M.D.  Electronically Signed     PV/MEDQ  D:  10/09/2006  T:  10/09/2006  Job:  [id number]

## 2010-11-12 NOTE — Assessment & Plan Note (Signed)
OFFICE VISIT   [name], [name]  DOB:  04/17/1924                                        April 07, 2007  CHART #:  [id number]   PROBLEM:  1. Status post resection of a 6 cm ascending aortic aneurysm with a      Hemashield Dacron graft and hemi-arch reconstruction April 2008.  2. 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  3. Perioperative atrial fibrillation, resolved.  4. Left lower lobe pulmonary nodule on preoperative CT scan, now      resolved, probably inflammatory origin.  5. A 5.5 cm fusiform abdominal aortic aneurysm.   HISTORY OF PRESENT ILLNESS:  The patient returns for a 19-month followup.  She had a CT scan to assess the thoracic aorta, as well as the left  lower lobe pulmonary nodule, which was seen preoperatively and was felt  to probably be inflammatory on the basis of her nonsmoking history and a  low activity level of the nodule on PET scan.  She is feeling well now  that the prednisone is down to 5 mg and is active in her garden and her  farm.  She denies chest pain or shortness of breath.  She remains on  Lipitor, metoprolol, aspirin, and Lasix.   PHYSICAL EXAM:  Blood pressure 120/70, pulse 68 and regular,  respirations 18, saturation 95%.  She is alert and pleasant, looks great.  Breath sounds are clear and  equal.  Cardiac exam reveals regular rhythm without murmur or gallop.  The sternal incision is well healed.  Peripheral pulses are intact in  all extremities and she has no significant edema.  ABDOMEN:  Benign.   LABORATORY DATA:  A CT scan today shows the thoracic aorta to be without  aneurysm and the graft in the ascending aorta is without abnormality.  The nodule in the left lower lobe is now resolved.   Her recent sed rate was 8, hematocrit 45, white count 7000, BUN 21,  creatinine 1.0.   IMPRESSION AND PLAN:  The patient is doing extremely  well.  I plan on  seeing her back in 6 months with a CT angiogram to assess the thoracic  aorta.  She does have an abdominal aortic aneurysm by prior CT scan and  having this followed by a vascular surgeon would be in order.  Otherwise, she will continue her current medications.   Kerin Perna, M.D.  Electronically Signed   PV/MEDQ  D:  04/07/2007  T:  04/07/2007  Job:  [id number]   cc:   Donia Guiles, M.D.  Yaakov Guthrie. Shon Hough, M.D.

## 2010-11-12 NOTE — Cardiovascular Report (Signed)
NAMEARLEIGH, DICOLA NO.:  [id number]   MEDICAL RECORD NO.:  [id number]          PATIENT TYPE:  INP   LOCATION:  4731                         FACILITY:  [facility]   PHYSICIAN:  Nanetta Batty, M.D.   DATE OF BIRTH:  Jan 04, 1924   DATE OF PROCEDURE:  10/06/2006  DATE OF DISCHARGE:                            CARDIAC CATHETERIZATION   INDICATION:  Ms. Seeney and 77 year old widowed white female, mother of  2, grandmother of 6 grandchildren, who is referred by Dr. Kathlee Nations  Trigt for cardiac catheterization and prior to surgical treatment of a  recently documented 5.8-cm ascending thoracic aortic aneurysm.  She had  normal LV function by 2-D echocardiography.  I catheterized her in 1994  and she was found to have normal arteries at that time.  Other problems  include hypertension and hyperlipidemia.  She presented to the ER this  morning with atypical chest pain/chest heaviness.  A chest CT revealed  stable thoracic aortic aneurysm and her labs were unremarkable.  She  presents now for diagnostic coronary angiography to define her coronary  anatomy.   DESCRIPTION OF PROCEDURE:  The patient was brought to the second floor  [facility] in the postabsorptive state.  She was  premedicated with p.o. Valium.  Her right groin was prepped and shaved  in the usual sterile fashion.  1% Xylocaine was used for local  anesthesia.  A 6-French sheath was inserted into the right femoral  artery using standard Seldinger technique.  The 6-French right and left  Judkins diagnostic catheters along with a 6-French pigtail catheter, a 6-  [place] no-torque catheter and a 6-French JL-5 diagnostic catheter were  used for selective coronary angiography and left ventriculography,  respectively.  Visipaque dye was used for the entirety of the case.  Retrograde aortic, left ventricular and pullback pressures were  recorded.   HEMODYNAMIC RESULTS:  1. Aortic systolic pressure  130, diastolic pressure of 56.  2. Left ventricular systolic pressure 128 and diastolic pressure 11.   SELECTIVE CORONARY ANGIOGRAPHY:  1. Left main normal.  2. LAD normal.  3. Left circumflex normal.  4. Right coronary is dominant with minor irregularities, at most 30%      distal stenosis between the genu and the crux.   LEFT VENTRICULOGRAPHY:  RAO left ventriculogram was performed using 25  mL of Visipaque dye at 12 mL per second.  The overall LVEF was estimated  at greater than 60% without focal wall motion abnormalities.  A large  ascending thoracic aorta aneurysm was easily documented.   IMPRESSION:  Ms. Wittke has noncritical coronary artery disease with  normal left ventricular function.  She will undergo thoracic ascending  aortic aneurysm resection and grafting by Dr. Kathlee Nations Trigt, who has  been notified of the patient's admission and catheterization findings.  Sheath was removed and pressure was held on the groin to achieve  hemostasis.  The patient left the lab in stable condition.      Nanetta Batty, M.D.  Electronically Signed     JB/MEDQ  D:  10/06/2006  T:  10/07/2006  Job:  [id number]   cc:   [facility] 2nd Floor Cardiac Cath Laboratory  [address], [place], [place] [address] [facility]  Donia Guiles, M.D.  Kerin Perna, M.D.

## 2010-11-12 NOTE — Op Note (Signed)
NAMEMARIELA, [name]                ACCOUNT NO.:  [id number]   MEDICAL RECORD NO.:  [id number]          PATIENT TYPE:  INP   LOCATION:  2306                         FACILITY:  [facility]   PHYSICIAN:  Kerin Perna, M.D.  DATE OF BIRTH:  05-27-1924   DATE OF PROCEDURE:  10/10/2006  DATE OF DISCHARGE:                               OPERATIVE REPORT   OPERATION:  Resection and grafting of ascending aortic fusiform  aneurysm, 5.8 cm, with replacement of the ascending aorta and hemiarch  replacement using a 28 mm Hemashield Dacron graft (serial number  [id number]).   PREOPERATIVE DIAGNOSIS:  5.8 cm fusiform ascending aortic aneurysm  extending into the aortic arch.   POSTOPERATIVE DIAGNOSIS:  5.8 cm fusiform ascending aortic aneurysm  extending into the aortic arch.   SURGEON:  Kerin Perna, M.D.   ASSISTANT:  Coral Ceo, P.A.-C.   ANESTHESIA:  General.   INDICATIONS:  The patient is an 77 year old female who has been having  chest pain and was found to have an abnormal x-ray.  A CT scan to rule  out pulmonary emboli demonstrated an ascending aneurysm fusiform of the  ascending aorta measuring 5.8 cm in diameter.  There is no evidence of  dissection.  She subsequently underwent a cardiac cath which showed no  significant coronary disease and a 2D echo showing normal function of  the aortic valve.  She presented to the hospital recently with chest  pain and was scheduled for surgery for replacement of the ascending  aorta and hemiarch replacement.  Her preoperative CT scan also  demonstrated a 1.8 cm nodule on the left lower lung posteriorly which  by, PET scan, had a small metabolic activity of 1.7 SUV.   Prior to the operation, I reviewed the patient's CT scan, cath, and  echo, and discussed the operative procedure to repair her aneurysm.  I  discussed the incision, the use of general anesthesia and  cardiopulmonary bypass, the possibility of needing hypothermic  circulatory  arrest if the aneurysm extended into the arch.  I told her  we would try to resect the pulmonary nodule if possible; however,  through a sternal incision a very posterior nodule may not be accessible  and she understood that fact.  After reviewing the issues as well as the  associated risks of MI, stroke, bleeding, blood transfusion requirement,  infection, and death, she agreed to proceed under what I felt was an  informed consent.   OPERATIVE FINDINGS:  The TEE showed normal functioning aortic valve and  good LV function.  The pulmonary nodule was too far posteriorly to  access, palpate, or visualize.  The ascending aorta and hemiarch were  replaced with a 28 mm Hemashield Dacron graft under hypothermic  circulatory arrest and retrograde cerebral perfusion.  The patient  received a unit of platelets at the end of the operation for a platelet  count on pump of 80,000.  She received 1 unit of packed cells during the  operation.  The right femoral artery was cannulated to provide arterial  inflow from cardiopulmonary bypass.   PROCEDURE:  The patient was brought to operating room and placed supine  on the operating table where general anesthesia was induced.  The chest,  abdomen and legs were prepped with Betadine and draped as a sterile  field.  A sternal incision was made and the pericardium was opened.  There was bloody pericardial effusion.  The ascending aorta was very  dilated and extended into the arch.  For that reason, the right femoral  artery was exposed, encircled with vessel loops, and used for  cannulation.  Heparin was administered and a 20-French femoral cannula  was placed and secured to the right common femoral artery and connected  to the arterial inflow line from the bypass pump.  A pursestring was  placed in the right atrium and the patient was cannulated and placed on  bypass.  A LV vent was placed via a pursestring in the right superior  pulmonary vein.   Cardioplegia catheters were placed into the coronary  sinus through a pursestring in the right atrium and a retrograde  cerebral catheter was placed in the SVC with a Prolene pursestring and a  tourniquet and a vessel loop was placed proximal to the retrograde  cerebral catheter in the SVC.  The patient was then cooled to 18 degrees  and when she reached 18 degrees, she was given a dose of Pentothal and  steroids from the anesthesiologist.  Circulatory arrest was then  accomplished and the blood volume drained back to the bypass circuit.   The aorta was transected at the proximal arch down to the sinotubular  junction.  It measured almost 6 cm in diameter.  There was no evidence  of dissection but it was quite thin up by the innominate artery at the  arch.  It was elected to use a 28 mm Dacron graft with a sidearm to  reconstruct the ascending aorta and under surface of the arch  (hemiarch).  The distal anastomosis was then performed while the patient  was given retrograde cerebral perfusion.  First, a running 4-0 Prolene  was used after the graft was beveled to the appropriate size and contour  for the hemiarch replacement.  After the bottom aspect of the  anastomosis was constructed with a running Prolene, interrupted  pledgeted 4-0 Prolene sutures were placed on the under surface of the  arch to reinforce the posterior running suture line.  Next, the running  suture line was continued anteriorly and completed.  Next, interrupted 4-  0 pledgeted Prolenes were then placed around the anterior surface of the  distal anastomosis.  When the distal anastomosis was completed,  perfusion was re-established with the patient in the deep Trendelenburg,  head down position, and as air and blood came up out of the graft, it  was clamped after an appropriate length had been determined for the  proximal anastomosis.  Full cardiopulmonary bypass was then reinitiated gradually rewarming the  patient  back to normothermia while the proximal anastomosis was being  performed.  The Hemashield graft was then divided at an appropriate  length and beveled angle for the proximal anastomosis.  The aortic valve  had been inspected and was competent and without anatomic abnormality.  Cardioplegia was delivered into the coronary ostia through a handheld  cardioplegia delivery system.  The proximal anastomosis was again sewn  with an initial running 4-0 Prolene on the posterior aspect.  This was  reinforced with interrupted pledgeted 4-0 Prolene on each side of the  aorta on the posterior  suture line.  Then, the running suture line was  carried over anteriorly and this was again reinforced with several  interrupted 4-0 pledgeted sutures after the running suture line was  completed.  The sidearm was then used to vent air after a dose of  retrograde warm blood cardioplegia and the usual filling maneuvers on  bypass were performed to evacuate air from the coronaries and the left  side of the heart.  The crossclamp on the graft beneath the distal  suture line was then removed and coronary blood flow was re-established  and air was vented from the sidearm of the graft which was then clamped.   The heart resumed a spontaneous rhythm.  The suture lines were inspected  and found be hemostatic.  The patient, at this point, was 26-27 degrees.  The cardioplegia catheter was were removed.  A vent was placed in the  aortic arch distal to the distal suture line to remove any air or  particulate matter and the LV vent was removed.  Temporary pacing wires  were applied and the patient continued to warm to 37 degrees.  Hemostasis appeared adequate.  When the patient reached 37 degrees, the  lungs were re-expanded and the ventilator was resumed.  The patient was  then weaned from bypass on low dose dopamine in a sinus rhythm.  The  cannula was removed and the femoral artery was repaired with a running 5-  0  Prolene.  Protamine was administered and there was no adverse  reaction.  The patient was given platelets to assist with coagulation.  The mediastinum was irrigated with warm antibiotic irrigation.  The  superior pericardial fat was closed over the aortic graft.  Two  mediastinal and a right pleural chest tube were placed and brought out  through separate incisions.  The sternum was closed with interrupted  steel wire.  The pectoralis fascia and the subcutaneous layer were  closed using running Vicryl.  The skin was closed with a subcuticular.  The right groin incision was closed in layers using Vicryl and sterile  dressings were applied.  Total bypass time was 176  minutes with circulatory arrest time of 50 minutes.  The patient  returned to the ICU in stable condition.  The transesophageal echo at  the end of the operation showed the aortic valve to maintain good  function and LV function was also normal.     Kerin Perna, M.D.  Electronically Signed     PV/MEDQ  D:  10/10/2006  T:  10/10/2006  Job:  [id number]   cc:   Nanetta Batty, M.D.

## 2010-11-12 NOTE — Discharge Summary (Signed)
[name], [name]                ACCOUNT NO.:  [id number]   MEDICAL RECORD NO.:  [id number]          PATIENT TYPE:  INP   LOCATION:  2037                         FACILITY:  [facility]   PHYSICIAN:  Kerin Perna, M.D.  DATE OF BIRTH:  [birth year]   DATE OF ADMISSION:  10/06/2006  DATE OF DISCHARGE:  10/16/2006                               DISCHARGE SUMMARY   PRIMARY DIAGNOSIS:  1. 5.8 cm fusiform ascending aortic aneurysm extending into the aortic      arch.   DISCHARGE DIAGNOSES:  1. Postoperative atrial fibrillation.  2. Postoperative volume overload.  3. Postoperative acute blood loss anemia.  4. Giant cell aortitis.   SECONDARY DIAGNOSES:  1. Hypertension.  2. Hyperlipidemia.  3. Status post right total knee replacement.  4. 1.8 cm left lower lobe nodule positive activity on PET scan.  5. History of herpes zoster after major surgical procedures over the      cecum and buttocks area.   IN HOSPITAL OPERATIONS AND PROCEDURES:  1. Transesophageal echocardiogram.  2. Resection and grafting the ascending aortic fusiform aneurysm 5.8      cm with replacement of the ascending aorta and hemiarthroplasty      using a 20-mm Hemashield Dacron graft.   HISTORY OF PRESENT ILLNESS:  The patient is 77 year old female who has  been having chest pain and was found to have abnormal x-ray.  CT scan  was done to rule out pulmonary emboli which demonstrated ascending  aneurysm fusiform of ascending aorta measuring 5.8 cm in diameter.  There is no evidence of dissection.  The patient subsequently underwent  cardiac catheterization which showed no significant coronary artery  disease.  CT echocardiogram done showed normal function of aortic  valves.  Dr. Donata Clay was consulted.  Dr. Donata Clay discussed with the  patient undergoing surgery.  He discussed risks and benefits with the  patient.  The patient acknowledged understanding and agreed to proceed.  Surgery was scheduled for October 10, 2006.  For details of the patient's  past medical history and physical exam please see dictated H&P.   HOSPITAL COURSE:  The patient was taken to the operating room October 10, 2006 where she underwent resection and grafting of the ascending aortic  fusiform aneurysm measuring 5.8 cm with replacement of ascending aorta  and hemi-arch replacement using a 28-mm Hemashield Dacron graft.  The  patient tolerated this procedure was transferred to the intensive care  unit in stable condition.  Postoperatively the patient was  hemodynamically stable.  She was extubated evening of surgery.  Following extubation the patient was noted to be alert and oriented x4.  Postop day #1 the patient noted to be stable.  Hematocrit 28%.  Pulmonary status was stable.  Chest x-ray remained stable.  Vital signs  followed closely.  The patient was able to be weaned off dopamine.  The  patient remained in the intensive care unit until postop day #3 when she  was transferred up to 2000.  Prior to transfer the patient did have bout  of atrial fibrillation.  The patient was  started on p.o. amiodarone at  that time.  The patient did convert back to normal sinus rhythm on her  own.  She did remain in normal sinus rhythm during the remainder of her  postoperative course.  She was continued on amiodarone and beta blocker.  Postoperatively the patient did develop acute blood loss anemia.  Hemoglobin, hematocrit dropping to 8.2 and 23.9.  She was started on  p.o. iron.  This was followed closely and started to improve by postop  day #5 measuring 8.5 and 24.6%.  The patient's fusiform aneurysm was  sent for pathology.  It came back positive for giant cell aortitis.  The  patient was started on prednisone at that time.  The patient's vital  signs were followed closely.  They remained stable.  The patient was  able to be weaned off oxygen saturating greater than 90%.  The patient  was noted to be afebrile.  Blood sugars were  followed postoperatively.  The patient did have slightly elevated blood sugars.  Hemoglobin A1c was  6.1.  The patient was started on low-dose Lasix.  We will continue to  monitor the patient's blood sugars and the patient may need to be  discharged on p.o. diabetic medications.  The patient did have volume  overload postoperatively and was started on low dose diuretics.  She was  below her baseline weight prior to discharge home.  The patient's  pulmonary status remained stable.  She remained in normal sinus rhythm  prior to discharge home.  Incisions were clean, dry and intact and  healing well.  Postoperatively the patient was started on acyclovir due  to history of herpes zoster after major surgical procedures over the  cecum and buttocks area.  The patient remained stable from this  standpoint.  The patient was out of bed ambulating well with assistance.  She was tolerating diet well.  No nausea, vomiting noted.   LABORATORY DATA:  Postop day #5 showed a white count 10.4, hemoglobin of  8.5, hematocrit 24.6, platelet count 149.  Sodium 137, potassium 4.0,  chloride 102, bicarb of 26, BUN 19, creatinine 0.65, glucose of 113.   The patient is tentatively ready for discharge home in the a.m. postop  day #6, October 16, 2006.   FOLLOW-UP APPOINTMENTS:  Follow-up appointment will be arranged with Dr.  Donata Clay for 3 weeks.  Our office will contact the patient with this  information.  The patient will need to obtain PA and lateral chest x-ray  1 hour prior to this appointment.  The patient will need to follow up  with Dr. Allyson Sabal in 2 weeks.  She will need contact Dr. Hazle Coca office to  arrange this appointment.  Also noted will need to follow up the  patient's left lower lobe nodule measuring 1.5 cm.  Dr. Donata Clay aware.   ACTIVITY:  Patient instructed no driving until released to do so, no lifting over 10 pounds.  The patient is told to ambulate three to four  times per day, progress  as tolerated and continue breathing exercises.   DIET:  The patient educated on diet to be low-fat, low-salt.   INCISIONAL CARE:  The patient is told to shower washing her incisions  using soap and water.  She is contact the office if she develops any  drainage or opening from any of her incision sites.   DIET:  The patient educated on diet to be low fat, low salt, as well as  carbohydrate modified  medium calorie diet.   DISCHARGE MEDICATIONS:  1. Aspirin 81 mg daily.  2. Toprol XL 25 mg daily.  3. Lipitor 10 mg at night.  4. Welchol 625 mg b.i.d.  5. Acyclovir 200 mg q.i.d. x4 days.  6. Prednisone 20 mg daily.  7. Amiodarone 200 mg b.i.d.  8. Niferex 150 mg daily.  9. Preparation H applied to affected area b.i.d.  10.Vicodin 5/325 one to two tablets q. 4-6 hours p.r.n. pain.      Theda Belfast, [place]      Kerin Perna, M.D.  Electronically Signed    KMD/MEDQ  D:  10/15/2006  T:  10/15/2006  Job:  [id number]   cc:   Nanetta Batty, M.D.

## 2011-03-28 LAB — POCT I-STAT 4, (NA,K, GLUC, HGB,HCT)
Hemoglobin: 15.6 — ABNORMAL HIGH
Potassium: 3.7

## 2011-04-13 LAB — DIFFERENTIAL
Basophils Relative: 0
Eosinophils Absolute: 0
Eosinophils Relative: 0
Lymphs Abs: 1.9
Monocytes Absolute: 0.5
Monocytes Relative: 5
Neutrophils Relative %: 77

## 2011-04-13 LAB — BASIC METABOLIC PANEL
CO2: 29
Chloride: 101
GFR calc Af Amer: 60 — ABNORMAL LOW
Potassium: 4
Sodium: 137

## 2011-04-13 LAB — PROTIME-INR: INR: 1

## 2011-04-13 LAB — CBC
HCT: 41.3
Hemoglobin: 13.8
MCHC: 33.5
MCV: 98.1
RBC: 4.21
WBC: 10.7 — ABNORMAL HIGH

## 2011-04-14 LAB — DIFFERENTIAL
Basophils Absolute: 0
Basophils Absolute: 0
Basophils Relative: 0
Eosinophils Absolute: 0.1
Eosinophils Absolute: 0.1
Eosinophils Relative: 1
Lymphocytes Relative: 5 — ABNORMAL LOW
Monocytes Absolute: 0.2
Neutro Abs: 10.1 — ABNORMAL HIGH
Neutrophils Relative %: 84 — ABNORMAL HIGH

## 2011-04-14 LAB — COMPREHENSIVE METABOLIC PANEL
Albumin: 2.7 — ABNORMAL LOW
BUN: 22
CO2: 23
Calcium: 8.2 — ABNORMAL LOW
Chloride: 95 — ABNORMAL LOW
Creatinine, Ser: 0.87
GFR calc non Af Amer: >60
Total Bilirubin: 1.7 — ABNORMAL HIGH

## 2011-04-14 LAB — CULTURE, BLOOD (ROUTINE X 2): Culture: NO GROWTH

## 2011-04-14 LAB — URINALYSIS, ROUTINE W REFLEX MICROSCOPIC
Nitrite: POSITIVE — AB
Specific Gravity, Urine: 1.015
Urobilinogen, UA: 0.2
pH: 6

## 2011-04-14 LAB — CBC
HCT: 40
Hemoglobin: 11.3 — ABNORMAL LOW
Hemoglobin: 13.5
MCHC: 33.6
MCV: 97.3
MCV: 98
Platelets: 158
Platelets: 160
RBC: 3.44 — ABNORMAL LOW
RDW: 15.2 — ABNORMAL HIGH
RDW: 15.5 — ABNORMAL HIGH
WBC: 10

## 2011-04-14 LAB — BASIC METABOLIC PANEL
BUN: 20
CO2: 25
Calcium: 7.8 — ABNORMAL LOW
Calcium: 8.1 — ABNORMAL LOW
Chloride: 104
Creatinine, Ser: 0.92
GFR calc Af Amer: >60
GFR calc non Af Amer: >60
Sodium: 138

## 2011-04-14 LAB — URINE MICROSCOPIC-ADD ON

## 2011-04-14 LAB — URINE CULTURE

## 2011-07-27 DIAGNOSIS — E782 Mixed hyperlipidemia: Secondary | ICD-10-CM | POA: Diagnosis not present

## 2011-07-27 DIAGNOSIS — E119 Type 2 diabetes mellitus without complications: Secondary | ICD-10-CM | POA: Diagnosis not present

## 2011-07-27 DIAGNOSIS — I10 Essential (primary) hypertension: Secondary | ICD-10-CM | POA: Diagnosis not present

## 2011-07-27 DIAGNOSIS — J449 Chronic obstructive pulmonary disease, unspecified: Secondary | ICD-10-CM | POA: Diagnosis not present

## 2011-10-04 DIAGNOSIS — N6489 Other specified disorders of breast: Secondary | ICD-10-CM | POA: Diagnosis not present

## 2011-10-06 DIAGNOSIS — IMO0002 Reserved for concepts with insufficient information to code with codable children: Secondary | ICD-10-CM | POA: Diagnosis not present

## 2011-10-06 DIAGNOSIS — M999 Biomechanical lesion, unspecified: Secondary | ICD-10-CM | POA: Diagnosis not present

## 2011-11-28 DIAGNOSIS — R300 Dysuria: Secondary | ICD-10-CM | POA: Diagnosis not present

## 2011-11-28 DIAGNOSIS — N3090 Cystitis, unspecified without hematuria: Secondary | ICD-10-CM | POA: Diagnosis not present

## 2011-12-09 DIAGNOSIS — L259 Unspecified contact dermatitis, unspecified cause: Secondary | ICD-10-CM | POA: Diagnosis not present

## 2012-02-08 DIAGNOSIS — E782 Mixed hyperlipidemia: Secondary | ICD-10-CM | POA: Diagnosis not present

## 2012-02-08 DIAGNOSIS — M316 Other giant cell arteritis: Secondary | ICD-10-CM | POA: Diagnosis not present

## 2012-02-08 DIAGNOSIS — M899 Disorder of bone, unspecified: Secondary | ICD-10-CM | POA: Diagnosis not present

## 2012-02-08 DIAGNOSIS — E119 Type 2 diabetes mellitus without complications: Secondary | ICD-10-CM | POA: Diagnosis not present

## 2012-02-08 DIAGNOSIS — I10 Essential (primary) hypertension: Secondary | ICD-10-CM | POA: Diagnosis not present

## 2012-02-08 DIAGNOSIS — Z0000 Encounter for general adult medical examination without abnormal findings: Secondary | ICD-10-CM | POA: Diagnosis not present

## 2012-02-22 DIAGNOSIS — R350 Frequency of micturition: Secondary | ICD-10-CM | POA: Diagnosis not present

## 2012-02-24 DIAGNOSIS — R350 Frequency of micturition: Secondary | ICD-10-CM | POA: Diagnosis not present

## 2012-02-24 DIAGNOSIS — N3020 Other chronic cystitis without hematuria: Secondary | ICD-10-CM | POA: Diagnosis not present

## 2012-03-13 DIAGNOSIS — N3020 Other chronic cystitis without hematuria: Secondary | ICD-10-CM | POA: Diagnosis not present

## 2012-03-13 DIAGNOSIS — N393 Stress incontinence (female) (male): Secondary | ICD-10-CM | POA: Diagnosis not present

## 2012-05-14 DIAGNOSIS — R350 Frequency of micturition: Secondary | ICD-10-CM | POA: Diagnosis not present

## 2012-05-14 DIAGNOSIS — N3020 Other chronic cystitis without hematuria: Secondary | ICD-10-CM | POA: Diagnosis not present

## 2012-08-03 DIAGNOSIS — E782 Mixed hyperlipidemia: Secondary | ICD-10-CM | POA: Diagnosis not present

## 2012-08-03 DIAGNOSIS — I10 Essential (primary) hypertension: Secondary | ICD-10-CM | POA: Diagnosis not present

## 2012-08-03 DIAGNOSIS — E119 Type 2 diabetes mellitus without complications: Secondary | ICD-10-CM | POA: Diagnosis not present

## 2012-10-04 DIAGNOSIS — N6489 Other specified disorders of breast: Secondary | ICD-10-CM | POA: Diagnosis not present

## 2012-11-29 DIAGNOSIS — M5137 Other intervertebral disc degeneration, lumbosacral region: Secondary | ICD-10-CM | POA: Diagnosis not present

## 2012-11-29 DIAGNOSIS — M999 Biomechanical lesion, unspecified: Secondary | ICD-10-CM | POA: Diagnosis not present

## 2013-01-15 DIAGNOSIS — Z23 Encounter for immunization: Secondary | ICD-10-CM | POA: Diagnosis not present

## 2013-01-31 DIAGNOSIS — J449 Chronic obstructive pulmonary disease, unspecified: Secondary | ICD-10-CM | POA: Diagnosis not present

## 2013-01-31 DIAGNOSIS — I10 Essential (primary) hypertension: Secondary | ICD-10-CM | POA: Diagnosis not present

## 2013-01-31 DIAGNOSIS — I739 Peripheral vascular disease, unspecified: Secondary | ICD-10-CM | POA: Diagnosis not present

## 2013-01-31 DIAGNOSIS — E1159 Type 2 diabetes mellitus with other circulatory complications: Secondary | ICD-10-CM | POA: Diagnosis not present

## 2013-01-31 DIAGNOSIS — M899 Disorder of bone, unspecified: Secondary | ICD-10-CM | POA: Diagnosis not present

## 2013-01-31 DIAGNOSIS — E782 Mixed hyperlipidemia: Secondary | ICD-10-CM | POA: Diagnosis not present

## 2013-01-31 DIAGNOSIS — M949 Disorder of cartilage, unspecified: Secondary | ICD-10-CM | POA: Diagnosis not present

## 2013-03-13 DIAGNOSIS — M899 Disorder of bone, unspecified: Secondary | ICD-10-CM | POA: Diagnosis not present

## 2013-05-08 DIAGNOSIS — R141 Gas pain: Secondary | ICD-10-CM | POA: Diagnosis not present

## 2013-05-08 DIAGNOSIS — R1013 Epigastric pain: Secondary | ICD-10-CM | POA: Diagnosis not present

## 2013-05-09 ENCOUNTER — Other Ambulatory Visit: Payer: Self-pay | Admitting: Family Medicine

## 2013-05-09 DIAGNOSIS — R1013 Epigastric pain: Secondary | ICD-10-CM

## 2013-05-09 DIAGNOSIS — R110 Nausea: Secondary | ICD-10-CM

## 2013-05-09 DIAGNOSIS — R141 Gas pain: Secondary | ICD-10-CM

## 2013-05-13 ENCOUNTER — Ambulatory Visit
Admission: RE | Admit: 2013-05-13 | Discharge: 2013-05-13 | Disposition: A | Discharge status: Home / Self Care | Payer: Medicare Other | Source: Ambulatory Visit | Attending: Family Medicine | Admitting: Family Medicine

## 2013-05-13 DIAGNOSIS — K8020 Calculus of gallbladder without cholecystitis without obstruction: Secondary | ICD-10-CM | POA: Diagnosis not present

## 2013-05-13 DIAGNOSIS — N281 Cyst of kidney, acquired: Secondary | ICD-10-CM | POA: Diagnosis not present

## 2013-05-13 DIAGNOSIS — R1013 Epigastric pain: Secondary | ICD-10-CM

## 2013-05-13 DIAGNOSIS — R141 Gas pain: Secondary | ICD-10-CM

## 2013-05-13 DIAGNOSIS — R110 Nausea: Secondary | ICD-10-CM

## 2013-05-15 DIAGNOSIS — N3020 Other chronic cystitis without hematuria: Secondary | ICD-10-CM | POA: Diagnosis not present

## 2013-05-15 DIAGNOSIS — R350 Frequency of micturition: Secondary | ICD-10-CM | POA: Diagnosis not present

## 2013-06-12 DIAGNOSIS — K8020 Calculus of gallbladder without cholecystitis without obstruction: Secondary | ICD-10-CM | POA: Diagnosis not present

## 2013-06-19 DIAGNOSIS — R112 Nausea with vomiting, unspecified: Secondary | ICD-10-CM | POA: Diagnosis not present

## 2013-06-19 DIAGNOSIS — R109 Unspecified abdominal pain: Secondary | ICD-10-CM | POA: Diagnosis not present

## 2013-06-24 DIAGNOSIS — M5137 Other intervertebral disc degeneration, lumbosacral region: Secondary | ICD-10-CM | POA: Diagnosis not present

## 2013-06-24 DIAGNOSIS — M999 Biomechanical lesion, unspecified: Secondary | ICD-10-CM | POA: Diagnosis not present

## 2013-06-25 DIAGNOSIS — M5137 Other intervertebral disc degeneration, lumbosacral region: Secondary | ICD-10-CM | POA: Diagnosis not present

## 2013-06-25 DIAGNOSIS — M999 Biomechanical lesion, unspecified: Secondary | ICD-10-CM | POA: Diagnosis not present

## 2013-06-26 DIAGNOSIS — M5137 Other intervertebral disc degeneration, lumbosacral region: Secondary | ICD-10-CM | POA: Diagnosis not present

## 2013-06-26 DIAGNOSIS — M999 Biomechanical lesion, unspecified: Secondary | ICD-10-CM | POA: Diagnosis not present

## 2013-07-02 DIAGNOSIS — M999 Biomechanical lesion, unspecified: Secondary | ICD-10-CM | POA: Diagnosis not present

## 2013-07-02 DIAGNOSIS — M5137 Other intervertebral disc degeneration, lumbosacral region: Secondary | ICD-10-CM | POA: Diagnosis not present

## 2013-07-08 DIAGNOSIS — M5137 Other intervertebral disc degeneration, lumbosacral region: Secondary | ICD-10-CM | POA: Diagnosis not present

## 2013-07-08 DIAGNOSIS — M999 Biomechanical lesion, unspecified: Secondary | ICD-10-CM | POA: Diagnosis not present

## 2013-07-16 ENCOUNTER — Other Ambulatory Visit: Payer: Self-pay | Admitting: Family Medicine

## 2013-07-16 ENCOUNTER — Ambulatory Visit
Admission: RE | Admit: 2013-07-16 | Discharge: 2013-07-16 | Disposition: A | Discharge status: Home / Self Care | Payer: Medicare Other | Source: Ambulatory Visit | Attending: Family Medicine | Admitting: Family Medicine

## 2013-07-16 DIAGNOSIS — S61409A Unspecified open wound of unspecified hand, initial encounter: Secondary | ICD-10-CM | POA: Diagnosis not present

## 2013-07-16 DIAGNOSIS — M19049 Primary osteoarthritis, unspecified hand: Secondary | ICD-10-CM | POA: Diagnosis not present

## 2013-07-16 DIAGNOSIS — W19XXXA Unspecified fall, initial encounter: Secondary | ICD-10-CM

## 2013-07-16 DIAGNOSIS — S59909A Unspecified injury of unspecified elbow, initial encounter: Secondary | ICD-10-CM | POA: Diagnosis not present

## 2013-07-16 DIAGNOSIS — S59919A Unspecified injury of unspecified forearm, initial encounter: Secondary | ICD-10-CM | POA: Diagnosis not present

## 2013-07-16 DIAGNOSIS — Z23 Encounter for immunization: Secondary | ICD-10-CM | POA: Diagnosis not present

## 2013-07-16 DIAGNOSIS — S6990XA Unspecified injury of unspecified wrist, hand and finger(s), initial encounter: Secondary | ICD-10-CM | POA: Diagnosis not present

## 2013-07-16 DIAGNOSIS — M25539 Pain in unspecified wrist: Secondary | ICD-10-CM | POA: Diagnosis not present

## 2013-07-31 DIAGNOSIS — I517 Cardiomegaly: Secondary | ICD-10-CM | POA: Diagnosis not present

## 2013-07-31 DIAGNOSIS — R110 Nausea: Secondary | ICD-10-CM | POA: Diagnosis not present

## 2013-07-31 DIAGNOSIS — Q619 Cystic kidney disease, unspecified: Secondary | ICD-10-CM | POA: Diagnosis not present

## 2013-07-31 DIAGNOSIS — IMO0002 Reserved for concepts with insufficient information to code with codable children: Secondary | ICD-10-CM | POA: Diagnosis not present

## 2013-07-31 DIAGNOSIS — R112 Nausea with vomiting, unspecified: Secondary | ICD-10-CM | POA: Diagnosis not present

## 2013-07-31 DIAGNOSIS — I059 Rheumatic mitral valve disease, unspecified: Secondary | ICD-10-CM | POA: Diagnosis not present

## 2013-07-31 DIAGNOSIS — K8020 Calculus of gallbladder without cholecystitis without obstruction: Secondary | ICD-10-CM | POA: Diagnosis not present

## 2013-07-31 DIAGNOSIS — I2510 Atherosclerotic heart disease of native coronary artery without angina pectoris: Secondary | ICD-10-CM | POA: Diagnosis not present

## 2013-07-31 DIAGNOSIS — I700 Atherosclerosis of aorta: Secondary | ICD-10-CM | POA: Diagnosis not present

## 2013-07-31 DIAGNOSIS — R109 Unspecified abdominal pain: Secondary | ICD-10-CM | POA: Diagnosis not present

## 2013-07-31 DIAGNOSIS — K5730 Diverticulosis of large intestine without perforation or abscess without bleeding: Secondary | ICD-10-CM | POA: Diagnosis not present

## 2013-07-31 DIAGNOSIS — Z90710 Acquired absence of both cervix and uterus: Secondary | ICD-10-CM | POA: Diagnosis not present

## 2013-07-31 DIAGNOSIS — K449 Diaphragmatic hernia without obstruction or gangrene: Secondary | ICD-10-CM | POA: Diagnosis not present

## 2013-08-01 ENCOUNTER — Encounter: Payer: Self-pay | Admitting: Cardiology

## 2013-08-01 DIAGNOSIS — R002 Palpitations: Secondary | ICD-10-CM | POA: Insufficient documentation

## 2013-08-05 DIAGNOSIS — E782 Mixed hyperlipidemia: Secondary | ICD-10-CM | POA: Diagnosis not present

## 2013-08-05 DIAGNOSIS — M899 Disorder of bone, unspecified: Secondary | ICD-10-CM | POA: Diagnosis not present

## 2013-08-05 DIAGNOSIS — I10 Essential (primary) hypertension: Secondary | ICD-10-CM | POA: Diagnosis not present

## 2013-08-05 DIAGNOSIS — Z23 Encounter for immunization: Secondary | ICD-10-CM | POA: Diagnosis not present

## 2013-08-05 DIAGNOSIS — M949 Disorder of cartilage, unspecified: Secondary | ICD-10-CM | POA: Diagnosis not present

## 2013-08-05 DIAGNOSIS — E1159 Type 2 diabetes mellitus with other circulatory complications: Secondary | ICD-10-CM | POA: Diagnosis not present

## 2013-08-05 DIAGNOSIS — I700 Atherosclerosis of aorta: Secondary | ICD-10-CM | POA: Diagnosis not present

## 2013-08-05 DIAGNOSIS — I739 Peripheral vascular disease, unspecified: Secondary | ICD-10-CM | POA: Diagnosis not present

## 2013-08-05 DIAGNOSIS — J449 Chronic obstructive pulmonary disease, unspecified: Secondary | ICD-10-CM | POA: Diagnosis not present

## 2013-08-28 ENCOUNTER — Encounter (HOSPITAL_COMMUNITY): Payer: Self-pay | Admitting: Emergency Medicine

## 2013-08-28 ENCOUNTER — Emergency Department (HOSPITAL_COMMUNITY): Payer: Medicare Other

## 2013-08-28 ENCOUNTER — Emergency Department (HOSPITAL_COMMUNITY)
Admission: EM | Admit: 2013-08-28 | Discharge: 2013-08-28 | Disposition: A | Discharge status: Home / Self Care | Payer: Medicare Other | Attending: Emergency Medicine | Admitting: Emergency Medicine

## 2013-08-28 DIAGNOSIS — Y929 Unspecified place or not applicable: Secondary | ICD-10-CM | POA: Insufficient documentation

## 2013-08-28 DIAGNOSIS — Z79899 Other long term (current) drug therapy: Secondary | ICD-10-CM | POA: Diagnosis not present

## 2013-08-28 DIAGNOSIS — M810 Age-related osteoporosis without current pathological fracture: Secondary | ICD-10-CM | POA: Diagnosis not present

## 2013-08-28 DIAGNOSIS — I10 Essential (primary) hypertension: Secondary | ICD-10-CM | POA: Diagnosis not present

## 2013-08-28 DIAGNOSIS — Z87448 Personal history of other diseases of urinary system: Secondary | ICD-10-CM | POA: Diagnosis not present

## 2013-08-28 DIAGNOSIS — J449 Chronic obstructive pulmonary disease, unspecified: Secondary | ICD-10-CM | POA: Diagnosis not present

## 2013-08-28 DIAGNOSIS — S59909A Unspecified injury of unspecified elbow, initial encounter: Secondary | ICD-10-CM | POA: Diagnosis not present

## 2013-08-28 DIAGNOSIS — S43016A Anterior dislocation of unspecified humerus, initial encounter: Secondary | ICD-10-CM | POA: Insufficient documentation

## 2013-08-28 DIAGNOSIS — E119 Type 2 diabetes mellitus without complications: Secondary | ICD-10-CM | POA: Insufficient documentation

## 2013-08-28 DIAGNOSIS — Y9301 Activity, walking, marching and hiking: Secondary | ICD-10-CM | POA: Insufficient documentation

## 2013-08-28 DIAGNOSIS — Z7982 Long term (current) use of aspirin: Secondary | ICD-10-CM | POA: Diagnosis not present

## 2013-08-28 DIAGNOSIS — Z4789 Encounter for other orthopedic aftercare: Secondary | ICD-10-CM | POA: Diagnosis not present

## 2013-08-28 DIAGNOSIS — M25519 Pain in unspecified shoulder: Secondary | ICD-10-CM | POA: Diagnosis not present

## 2013-08-28 DIAGNOSIS — T148XXA Other injury of unspecified body region, initial encounter: Secondary | ICD-10-CM | POA: Diagnosis not present

## 2013-08-28 DIAGNOSIS — M25529 Pain in unspecified elbow: Secondary | ICD-10-CM | POA: Diagnosis not present

## 2013-08-28 DIAGNOSIS — W010XXA Fall on same level from slipping, tripping and stumbling without subsequent striking against object, initial encounter: Secondary | ICD-10-CM | POA: Insufficient documentation

## 2013-08-28 DIAGNOSIS — J4489 Other specified chronic obstructive pulmonary disease: Secondary | ICD-10-CM | POA: Insufficient documentation

## 2013-08-28 DIAGNOSIS — S43006A Unspecified dislocation of unspecified shoulder joint, initial encounter: Secondary | ICD-10-CM

## 2013-08-28 MED ORDER — OXYCODONE-ACETAMINOPHEN 5-325 MG PO TABS
1.0000 | ORAL_TABLET | Freq: Once | ORAL | Status: AC
  Administered 2013-08-28: 1 via ORAL
  Filled 2013-08-28: qty 1

## 2013-08-28 MED ORDER — HYDROCODONE-ACETAMINOPHEN 5-325 MG PO TABS: 1.0000 | ORAL_TABLET | Freq: Four times a day (QID) | ORAL | Status: DC | PRN

## 2013-08-28 MED ORDER — PROPOFOL 10 MG/ML IV BOLUS
0.5000 mg/kg | Freq: Once | INTRAVENOUS | Status: AC
  Administered 2013-08-28: 20 mg via INTRAVENOUS
  Filled 2013-08-28: qty 1

## 2013-08-28 MED ORDER — SODIUM CHLORIDE 0.9 % IV BOLUS (SEPSIS)
500.0000 mL | Freq: Once | INTRAVENOUS | Status: AC
  Administered 2013-08-28: 500 mL via INTRAVENOUS

## 2013-08-28 MED ORDER — MORPHINE SULFATE 4 MG/ML IJ SOLN
4.0000 mg | Freq: Once | INTRAMUSCULAR | Status: AC
  Administered 2013-08-28: 4 mg via INTRAVENOUS
  Filled 2013-08-28: qty 1

## 2013-08-28 MED ORDER — PROPOFOL 10 MG/ML IV BOLUS
INTRAVENOUS | Status: AC | PRN
  Administered 2013-08-28: 10 mg via INTRAVENOUS

## 2013-08-28 NOTE — ED Notes (Signed)
Patient did not want the bed change stated she is about to go.

## 2013-08-28 NOTE — Sedation Documentation (Signed)
 20mg  more of Propofol given.

## 2013-08-28 NOTE — ED Provider Notes (Addendum)
CSN: [id number]     Arrival date & time 08/28/13  1027 History   First MD Initiated Contact with Patient 08/28/13 1044     Chief Complaint  Patient presents with  . Fall  . Shoulder Pain     (Consider location/radiation/quality/duration/timing/severity/associated sxs/prior Treatment) Patient is a 80 y.o. female presenting with fall and shoulder pain. The history is provided by the patient.  Fall This is a new (walking on the sidewalk and tripped and landed on the left arm) problem. The current episode started less than 1 hour ago. The problem occurs constantly. The problem has not changed since onset.Associated symptoms comments: Severe pain in the left upper arm.  No neck pain.  No head injury or LoC.  No pain in the lower ext.. The symptoms are aggravated by bending. Nothing relieves the symptoms. She has tried rest for the symptoms. The treatment provided no relief.  Shoulder Pain    Past Medical History  Diagnosis Date  . Hypertension   . Hyperlipidemia   . Osteoporosis     giant cell arteritis, Dr. Kellie Simmering  . Diabetes mellitus without complication   . Aortic dissection     Dr. Morton Peters  . COPD (chronic obstructive pulmonary disease)   . Chronic cystitis     Dr. Sherron Monday  . Aortic atherosclerosis     on [place] 05/13/13   History reviewed. No pertinent past surgical history. No family history on file. History  Substance Use Topics  . Smoking status: Never Smoker   . Smokeless tobacco: Not on file  . Alcohol Use: No   OB History   Grav Para Term Preterm Abortions TAB SAB Ect Mult Living                 Review of Systems  Musculoskeletal: Negative for neck pain.  Neurological: Negative for syncope.  All other systems reviewed and are negative.      Allergies  Actonel; Codeine; Lipitor; Macrobid; and Septra  Home Medications   Current Outpatient Rx  Name  Route  Sig  Dispense  Refill  . aspirin 81 MG tablet   Oral   Take 81 mg by mouth every other day.          . calcium carbonate 200 MG capsule   Oral   Take 2,000 mg by mouth daily.         . Cholecalciferol (VITAMIN D) 2000 UNITS tablet   Oral   Take 2,000 Units by mouth daily.         . furosemide (LASIX) 20 MG tablet   Oral   Take 20 mg by mouth daily.         Marland Kitchen HYDROcodone-acetaminophen (NORCO/VICODIN) 5-325 MG per tablet   Oral   Take 1 tablet by mouth every 6 (six) hours as needed for moderate pain.         . metoprolol succinate (TOPROL-XL) 50 MG 24 hr tablet   Oral   Take 50 mg by mouth daily. Take with or immediately following a meal.         . Multiple Vitamins-Minerals (MULTIVITAMIN PO)   Oral   Take by mouth.         . Omega-3 Fatty Acids (FISH OIL) 1000 MG CAPS   Oral   Take 1 capsule by mouth daily.         Marland Kitchen trimethoprim (TRIMPEX) 100 MG tablet   Oral   Take 100 mg by mouth daily.  BP 176/81  Pulse 77  Temp(Src) 98.2 F (36.8 C) (Oral)  Resp 18  SpO2 95% Physical Exam  Nursing note and vitals reviewed. Constitutional: She is oriented to person, place, and time. She appears well-developed and well-nourished. No distress.  HENT:  Head: Normocephalic and atraumatic.  Mouth/Throat: Oropharynx is clear and moist.  Eyes: Conjunctivae and EOM are normal. Pupils are equal, round, and reactive to light.  Neck: Normal range of motion. Neck supple.  Cardiovascular: Normal rate, regular rhythm and intact distal pulses.   No murmur heard. Pulmonary/Chest: Effort normal and breath sounds normal. No respiratory distress. She has no wheezes. She has no rales.  Abdominal: Soft. She exhibits no distension. There is no tenderness. There is no rebound and no guarding.  Musculoskeletal: Normal range of motion. She exhibits no edema.       Left elbow: Normal.       Left wrist: Normal.       Right hip: Normal.       Left hip: Normal.       Cervical back: Normal.       Thoracic back: Normal.       Lumbar back: Normal.       Left upper arm:  She exhibits tenderness, bony tenderness, swelling and deformity.  2+ radial pulse.  Normal ROM of the wrist and no palpable tenderness of elbow.  No clavicle pain  Neurological: She is alert and oriented to person, place, and time.  Skin: Skin is warm and dry. No rash noted. No erythema.  Psychiatric: She has a normal mood and affect. Her behavior is normal.    ED Course  Reduction of dislocation Date/Time: 08/28/2013 2:40 PM Performed by: Gwyneth Sprout Authorized by: Gwyneth Sprout Consent: Verbal consent obtained. Risks and benefits: risks, benefits and alternatives were discussed Consent given by: patient Patient understanding: patient states understanding of the procedure being performed Patient consent: the patient's understanding of the procedure matches consent given Procedure consent: procedure consent matches procedure scheduled Relevant documents: relevant documents present and verified Site marked: the operative site was marked Imaging studies: imaging studies available Patient identity confirmed: verbally with patient and arm band Preparation: Patient was prepped and draped in the usual sterile fashion. Local anesthesia used: no Patient sedated: yes Sedatives: propofol Analgesia: morphine Patient tolerance: Patient tolerated the procedure well with no immediate complications. Comments: With abduction and countertraction shoulder was reduced with a loud pop and return of anatomic structure   (including critical care time) Labs Review Labs Reviewed - No data to display Imaging Review Dg Elbow Complete Left  08/28/2013   CLINICAL DATA:  Fall.  Humerus and elbow pain.  EXAM: LEFT ELBOW - COMPLETE 3+ VIEW  COMPARISON:  None.  FINDINGS: Positioning is suboptimal, but allowing for this, there is no fracture or dislocation. No arthropathic changes. No joint effusion is evident.  IMPRESSION: Negative.   Electronically Signed   By: Amie Portland M.D.   On: 08/28/2013 12:40    Dg Shoulder Left Port  08/28/2013   CLINICAL DATA:  Shoulder dislocation status post reduction.  EXAM: PORTABLE LEFT SHOULDER - 2+ VIEW  COMPARISON:  08/28/2013 at 12:13 p.m.  FINDINGS: Previously noted anterior subcoracoid shoulder dislocation has been reduced. No definite fracture identified. Enthesopathic change noted along the superior aspect of the humerus, presumably chronic related to underlying rotator cuff pathology.  IMPRESSION: 1. Status post reduction of previously noted anterior subcoracoid shoulder dislocation.   Electronically Signed   By: Trudie Reed  M.D.   On: 08/28/2013 15:31   Dg Humerus Left  08/28/2013   CLINICAL DATA:  Pain post trauma  EXAM: LEFT HUMERUS - 2+ VIEW  COMPARISON:  None.  FINDINGS: Frontal and transthoracic images were obtained. There is evidence of a subcoracoid anterior dislocation. No fracture apparent. No abnormal periosteal reaction.  IMPRESSION: No fracture appreciable. Subcoracoid anterior dislocation at the shoulder.   Electronically Signed   By: Bretta BangWilliam  Woodruff M.D.   On: 08/28/2013 12:39     EKG Interpretation None      MDM   Final diagnoses:  Shoulder dislocation   Patient presenting after a mechanical fall when walking along the sidewalk. She landed on her left upper arm with severe pain in the proximal humerus with mild deformity. She has no tenderness of her elbow or wrist and no other signs of injury. Patient does not take anti-coagulation.  She is neurovascularly intact but is extremely uncomfortable. Patient given oral pain medication and will image the humerus and elbow.  12:58 PM Plain films consistent with shoulder dislocation.  Will give pt propofol for reduction.  Risk and benefits given.    2:41 PM Shoulder reduced.  Pt remains HD intact.  Reduction films show reduced shoulder.  Procedural sedation Performed by: Gwyneth SproutPLUNKETT,Kenji Mapel Consent: Verbal consent obtained. Risks and benefits: risks, benefits and alternatives were  discussed Required items: required blood products, implants, devices, and special equipment available Patient identity confirmed: arm band and provided demographic data Time out: Immediately prior to procedure a "time out" was called to verify the correct patient, procedure, equipment, support staff and site/side marked as required.  Sedation type: moderate (conscious) sedation NPO time confirmed and considedered  Sedatives: PROPOFOL  Physician Time at Bedside: 30min  Vitals: Vital signs were monitored during sedation. Cardiac Monitor, pulse oximeter Patient tolerance: Patient tolerated the procedure well with no immediate complications. Comments: Pt with uneventful recovered. Returned to pre-procedural sedation baseline     Gwyneth SproutWhitney Derico Mitton, MD 08/28/13 1531  Gwyneth SproutWhitney Traeh Milroy, MD 08/28/13 1534  Gwyneth SproutWhitney Tredarius Cobern, MD 08/28/13 1534

## 2013-08-28 NOTE — ED Notes (Signed)
Propofol administered by Dr. Anitra LauthPlunkett.

## 2013-08-28 NOTE — ED Notes (Signed)
Aram Beechamynthia RN EDP Plunkett at the bs

## 2013-08-28 NOTE — ED Notes (Signed)
Bed: WA10 Expected date:  Expected time:  Means of arrival:  Comments: EMS Fall 

## 2013-08-28 NOTE — Discharge Instructions (Signed)

## 2013-08-28 NOTE — ED Notes (Signed)
Per [facility]- Pt resides at home FULL CODE. Larey SeatFell tripped on side walk NO LOC Denies neck and back pain cleared [facility] on scene. Pt only c/o of left shoulder pain. No deformity. Good CMS.

## 2013-08-28 NOTE — ED Notes (Signed)
Patient requested that this tech cut off her shirts. Patient was in too much pain to move arm.

## 2013-08-28 NOTE — ED Notes (Signed)
Pt changed into blue paper scrubs. Pt assisted to standing position. Immobilizer sling in place.

## 2013-08-29 DIAGNOSIS — M999 Biomechanical lesion, unspecified: Secondary | ICD-10-CM | POA: Diagnosis not present

## 2013-08-29 DIAGNOSIS — M5137 Other intervertebral disc degeneration, lumbosacral region: Secondary | ICD-10-CM | POA: Diagnosis not present

## 2013-09-02 DIAGNOSIS — M999 Biomechanical lesion, unspecified: Secondary | ICD-10-CM | POA: Diagnosis not present

## 2013-09-02 DIAGNOSIS — M5137 Other intervertebral disc degeneration, lumbosacral region: Secondary | ICD-10-CM | POA: Diagnosis not present

## 2013-09-05 DIAGNOSIS — M5137 Other intervertebral disc degeneration, lumbosacral region: Secondary | ICD-10-CM | POA: Diagnosis not present

## 2013-09-05 DIAGNOSIS — M999 Biomechanical lesion, unspecified: Secondary | ICD-10-CM | POA: Diagnosis not present

## 2013-09-06 DIAGNOSIS — M999 Biomechanical lesion, unspecified: Secondary | ICD-10-CM | POA: Diagnosis not present

## 2013-09-06 DIAGNOSIS — M5137 Other intervertebral disc degeneration, lumbosacral region: Secondary | ICD-10-CM | POA: Diagnosis not present

## 2013-09-09 DIAGNOSIS — M5137 Other intervertebral disc degeneration, lumbosacral region: Secondary | ICD-10-CM | POA: Diagnosis not present

## 2013-09-09 DIAGNOSIS — M999 Biomechanical lesion, unspecified: Secondary | ICD-10-CM | POA: Diagnosis not present

## 2013-09-10 DIAGNOSIS — M999 Biomechanical lesion, unspecified: Secondary | ICD-10-CM | POA: Diagnosis not present

## 2013-09-10 DIAGNOSIS — M5137 Other intervertebral disc degeneration, lumbosacral region: Secondary | ICD-10-CM | POA: Diagnosis not present

## 2013-09-11 DIAGNOSIS — M5137 Other intervertebral disc degeneration, lumbosacral region: Secondary | ICD-10-CM | POA: Diagnosis not present

## 2013-09-11 DIAGNOSIS — M999 Biomechanical lesion, unspecified: Secondary | ICD-10-CM | POA: Diagnosis not present

## 2013-09-13 DIAGNOSIS — M999 Biomechanical lesion, unspecified: Secondary | ICD-10-CM | POA: Diagnosis not present

## 2013-09-13 DIAGNOSIS — M5137 Other intervertebral disc degeneration, lumbosacral region: Secondary | ICD-10-CM | POA: Diagnosis not present

## 2013-09-16 DIAGNOSIS — M5137 Other intervertebral disc degeneration, lumbosacral region: Secondary | ICD-10-CM | POA: Diagnosis not present

## 2013-09-16 DIAGNOSIS — M999 Biomechanical lesion, unspecified: Secondary | ICD-10-CM | POA: Diagnosis not present

## 2013-09-17 DIAGNOSIS — M5137 Other intervertebral disc degeneration, lumbosacral region: Secondary | ICD-10-CM | POA: Diagnosis not present

## 2013-09-17 DIAGNOSIS — M999 Biomechanical lesion, unspecified: Secondary | ICD-10-CM | POA: Diagnosis not present

## 2013-09-18 DIAGNOSIS — M5137 Other intervertebral disc degeneration, lumbosacral region: Secondary | ICD-10-CM | POA: Diagnosis not present

## 2013-09-18 DIAGNOSIS — M999 Biomechanical lesion, unspecified: Secondary | ICD-10-CM | POA: Diagnosis not present

## 2013-09-19 DIAGNOSIS — M999 Biomechanical lesion, unspecified: Secondary | ICD-10-CM | POA: Diagnosis not present

## 2013-09-19 DIAGNOSIS — M5137 Other intervertebral disc degeneration, lumbosacral region: Secondary | ICD-10-CM | POA: Diagnosis not present

## 2013-09-24 DIAGNOSIS — M999 Biomechanical lesion, unspecified: Secondary | ICD-10-CM | POA: Diagnosis not present

## 2013-09-24 DIAGNOSIS — M5137 Other intervertebral disc degeneration, lumbosacral region: Secondary | ICD-10-CM | POA: Diagnosis not present

## 2013-09-26 DIAGNOSIS — M5137 Other intervertebral disc degeneration, lumbosacral region: Secondary | ICD-10-CM | POA: Diagnosis not present

## 2013-09-26 DIAGNOSIS — M999 Biomechanical lesion, unspecified: Secondary | ICD-10-CM | POA: Diagnosis not present

## 2013-10-01 DIAGNOSIS — M5137 Other intervertebral disc degeneration, lumbosacral region: Secondary | ICD-10-CM | POA: Diagnosis not present

## 2013-10-01 DIAGNOSIS — M999 Biomechanical lesion, unspecified: Secondary | ICD-10-CM | POA: Diagnosis not present

## 2013-10-07 DIAGNOSIS — Z1231 Encounter for screening mammogram for malignant neoplasm of breast: Secondary | ICD-10-CM | POA: Diagnosis not present

## 2013-10-10 DIAGNOSIS — M999 Biomechanical lesion, unspecified: Secondary | ICD-10-CM | POA: Diagnosis not present

## 2013-10-10 DIAGNOSIS — M5137 Other intervertebral disc degeneration, lumbosacral region: Secondary | ICD-10-CM | POA: Diagnosis not present

## 2013-10-17 DIAGNOSIS — M999 Biomechanical lesion, unspecified: Secondary | ICD-10-CM | POA: Diagnosis not present

## 2013-10-17 DIAGNOSIS — M5137 Other intervertebral disc degeneration, lumbosacral region: Secondary | ICD-10-CM | POA: Diagnosis not present

## 2013-10-24 DIAGNOSIS — M999 Biomechanical lesion, unspecified: Secondary | ICD-10-CM | POA: Diagnosis not present

## 2013-10-24 DIAGNOSIS — M5137 Other intervertebral disc degeneration, lumbosacral region: Secondary | ICD-10-CM | POA: Diagnosis not present

## 2013-11-04 DIAGNOSIS — E782 Mixed hyperlipidemia: Secondary | ICD-10-CM | POA: Diagnosis not present

## 2013-11-14 DIAGNOSIS — M999 Biomechanical lesion, unspecified: Secondary | ICD-10-CM | POA: Diagnosis not present

## 2013-11-14 DIAGNOSIS — M5137 Other intervertebral disc degeneration, lumbosacral region: Secondary | ICD-10-CM | POA: Diagnosis not present

## 2013-11-28 DIAGNOSIS — M999 Biomechanical lesion, unspecified: Secondary | ICD-10-CM | POA: Diagnosis not present

## 2013-11-28 DIAGNOSIS — M5137 Other intervertebral disc degeneration, lumbosacral region: Secondary | ICD-10-CM | POA: Diagnosis not present

## 2014-02-18 DIAGNOSIS — Z23 Encounter for immunization: Secondary | ICD-10-CM | POA: Diagnosis not present

## 2014-02-20 DIAGNOSIS — M316 Other giant cell arteritis: Secondary | ICD-10-CM | POA: Diagnosis not present

## 2014-02-20 DIAGNOSIS — I700 Atherosclerosis of aorta: Secondary | ICD-10-CM | POA: Diagnosis not present

## 2014-02-20 DIAGNOSIS — J449 Chronic obstructive pulmonary disease, unspecified: Secondary | ICD-10-CM | POA: Diagnosis not present

## 2014-02-20 DIAGNOSIS — E1159 Type 2 diabetes mellitus with other circulatory complications: Secondary | ICD-10-CM | POA: Diagnosis not present

## 2014-02-20 DIAGNOSIS — E782 Mixed hyperlipidemia: Secondary | ICD-10-CM | POA: Diagnosis not present

## 2014-02-20 DIAGNOSIS — I10 Essential (primary) hypertension: Secondary | ICD-10-CM | POA: Diagnosis not present

## 2014-02-20 DIAGNOSIS — I739 Peripheral vascular disease, unspecified: Secondary | ICD-10-CM | POA: Diagnosis not present

## 2014-03-31 DIAGNOSIS — H3531 Nonexudative age-related macular degeneration: Secondary | ICD-10-CM | POA: Diagnosis not present

## 2014-03-31 DIAGNOSIS — E119 Type 2 diabetes mellitus without complications: Secondary | ICD-10-CM | POA: Diagnosis not present

## 2014-03-31 DIAGNOSIS — Z961 Presence of intraocular lens: Secondary | ICD-10-CM | POA: Diagnosis not present

## 2014-03-31 DIAGNOSIS — H52203 Unspecified astigmatism, bilateral: Secondary | ICD-10-CM | POA: Diagnosis not present

## 2014-06-11 DIAGNOSIS — R350 Frequency of micturition: Secondary | ICD-10-CM | POA: Diagnosis not present

## 2014-06-11 DIAGNOSIS — N3020 Other chronic cystitis without hematuria: Secondary | ICD-10-CM | POA: Diagnosis not present

## 2014-08-04 DIAGNOSIS — E1151 Type 2 diabetes mellitus with diabetic peripheral angiopathy without gangrene: Secondary | ICD-10-CM | POA: Diagnosis not present

## 2014-08-04 DIAGNOSIS — B351 Tinea unguium: Secondary | ICD-10-CM | POA: Diagnosis not present

## 2014-08-04 DIAGNOSIS — E782 Mixed hyperlipidemia: Secondary | ICD-10-CM | POA: Diagnosis not present

## 2014-08-04 DIAGNOSIS — I739 Peripheral vascular disease, unspecified: Secondary | ICD-10-CM | POA: Diagnosis not present

## 2014-08-04 DIAGNOSIS — M810 Age-related osteoporosis without current pathological fracture: Secondary | ICD-10-CM | POA: Diagnosis not present

## 2014-08-04 DIAGNOSIS — I700 Atherosclerosis of aorta: Secondary | ICD-10-CM | POA: Diagnosis not present

## 2014-08-04 DIAGNOSIS — J449 Chronic obstructive pulmonary disease, unspecified: Secondary | ICD-10-CM | POA: Diagnosis not present

## 2014-08-04 DIAGNOSIS — I10 Essential (primary) hypertension: Secondary | ICD-10-CM | POA: Diagnosis not present

## 2014-08-04 DIAGNOSIS — M316 Other giant cell arteritis: Secondary | ICD-10-CM | POA: Diagnosis not present

## 2014-09-05 DIAGNOSIS — L259 Unspecified contact dermatitis, unspecified cause: Secondary | ICD-10-CM | POA: Diagnosis not present

## 2014-09-05 DIAGNOSIS — X32XXXD Exposure to sunlight, subsequent encounter: Secondary | ICD-10-CM | POA: Diagnosis not present

## 2014-09-05 DIAGNOSIS — L570 Actinic keratosis: Secondary | ICD-10-CM | POA: Diagnosis not present

## 2014-10-13 DIAGNOSIS — Z1231 Encounter for screening mammogram for malignant neoplasm of breast: Secondary | ICD-10-CM | POA: Diagnosis not present

## 2014-11-04 DIAGNOSIS — E782 Mixed hyperlipidemia: Secondary | ICD-10-CM | POA: Diagnosis not present

## 2014-11-25 DIAGNOSIS — I10 Essential (primary) hypertension: Secondary | ICD-10-CM | POA: Diagnosis not present

## 2014-11-25 DIAGNOSIS — L03019 Cellulitis of unspecified finger: Secondary | ICD-10-CM | POA: Diagnosis not present

## 2015-02-12 DIAGNOSIS — J449 Chronic obstructive pulmonary disease, unspecified: Secondary | ICD-10-CM | POA: Diagnosis not present

## 2015-02-12 DIAGNOSIS — L309 Dermatitis, unspecified: Secondary | ICD-10-CM | POA: Diagnosis not present

## 2015-02-12 DIAGNOSIS — E782 Mixed hyperlipidemia: Secondary | ICD-10-CM | POA: Diagnosis not present

## 2015-02-12 DIAGNOSIS — I10 Essential (primary) hypertension: Secondary | ICD-10-CM | POA: Diagnosis not present

## 2015-02-12 DIAGNOSIS — I700 Atherosclerosis of aorta: Secondary | ICD-10-CM | POA: Diagnosis not present

## 2015-02-12 DIAGNOSIS — E1151 Type 2 diabetes mellitus with diabetic peripheral angiopathy without gangrene: Secondary | ICD-10-CM | POA: Diagnosis not present

## 2015-02-23 DIAGNOSIS — Z23 Encounter for immunization: Secondary | ICD-10-CM | POA: Diagnosis not present

## 2015-03-24 DIAGNOSIS — D040 Carcinoma in situ of skin of lip: Secondary | ICD-10-CM | POA: Diagnosis not present

## 2015-03-24 DIAGNOSIS — X32XXXD Exposure to sunlight, subsequent encounter: Secondary | ICD-10-CM | POA: Diagnosis not present

## 2015-03-24 DIAGNOSIS — L570 Actinic keratosis: Secondary | ICD-10-CM | POA: Diagnosis not present

## 2015-04-01 DIAGNOSIS — E113291 Type 2 diabetes mellitus with mild nonproliferative diabetic retinopathy without macular edema, right eye: Secondary | ICD-10-CM | POA: Diagnosis not present

## 2015-04-01 DIAGNOSIS — H04123 Dry eye syndrome of bilateral lacrimal glands: Secondary | ICD-10-CM | POA: Diagnosis not present

## 2015-04-01 DIAGNOSIS — H353111 Nonexudative age-related macular degeneration, right eye, early dry stage: Secondary | ICD-10-CM | POA: Diagnosis not present

## 2015-04-01 DIAGNOSIS — H52203 Unspecified astigmatism, bilateral: Secondary | ICD-10-CM | POA: Diagnosis not present

## 2015-04-10 DIAGNOSIS — M9911 Subluxation complex (vertebral) of cervical region: Secondary | ICD-10-CM | POA: Diagnosis not present

## 2015-04-10 DIAGNOSIS — M9912 Subluxation complex (vertebral) of thoracic region: Secondary | ICD-10-CM | POA: Diagnosis not present

## 2015-04-10 DIAGNOSIS — M9913 Subluxation complex (vertebral) of lumbar region: Secondary | ICD-10-CM | POA: Diagnosis not present

## 2015-05-05 DIAGNOSIS — Z85828 Personal history of other malignant neoplasm of skin: Secondary | ICD-10-CM | POA: Diagnosis not present

## 2015-05-05 DIAGNOSIS — L570 Actinic keratosis: Secondary | ICD-10-CM | POA: Diagnosis not present

## 2015-05-05 DIAGNOSIS — X32XXXD Exposure to sunlight, subsequent encounter: Secondary | ICD-10-CM | POA: Diagnosis not present

## 2015-05-05 DIAGNOSIS — Z08 Encounter for follow-up examination after completed treatment for malignant neoplasm: Secondary | ICD-10-CM | POA: Diagnosis not present

## 2015-06-15 DIAGNOSIS — N393 Stress incontinence (female) (male): Secondary | ICD-10-CM | POA: Diagnosis not present

## 2015-06-15 DIAGNOSIS — N3020 Other chronic cystitis without hematuria: Secondary | ICD-10-CM | POA: Diagnosis not present

## 2015-07-21 DIAGNOSIS — J45909 Unspecified asthma, uncomplicated: Secondary | ICD-10-CM | POA: Diagnosis not present

## 2015-07-21 DIAGNOSIS — H6120 Impacted cerumen, unspecified ear: Secondary | ICD-10-CM | POA: Diagnosis not present

## 2015-08-10 DIAGNOSIS — J449 Chronic obstructive pulmonary disease, unspecified: Secondary | ICD-10-CM | POA: Diagnosis not present

## 2015-08-10 DIAGNOSIS — I10 Essential (primary) hypertension: Secondary | ICD-10-CM | POA: Diagnosis not present

## 2015-08-10 DIAGNOSIS — I739 Peripheral vascular disease, unspecified: Secondary | ICD-10-CM | POA: Diagnosis not present

## 2015-08-10 DIAGNOSIS — E1151 Type 2 diabetes mellitus with diabetic peripheral angiopathy without gangrene: Secondary | ICD-10-CM | POA: Diagnosis not present

## 2015-08-10 DIAGNOSIS — I700 Atherosclerosis of aorta: Secondary | ICD-10-CM | POA: Diagnosis not present

## 2015-08-10 DIAGNOSIS — E782 Mixed hyperlipidemia: Secondary | ICD-10-CM | POA: Diagnosis not present

## 2015-08-10 DIAGNOSIS — E11319 Type 2 diabetes mellitus with unspecified diabetic retinopathy without macular edema: Secondary | ICD-10-CM | POA: Diagnosis not present

## 2015-08-10 DIAGNOSIS — M810 Age-related osteoporosis without current pathological fracture: Secondary | ICD-10-CM | POA: Diagnosis not present

## 2015-08-12 DIAGNOSIS — M859 Disorder of bone density and structure, unspecified: Secondary | ICD-10-CM | POA: Diagnosis not present

## 2015-08-12 DIAGNOSIS — M8589 Other specified disorders of bone density and structure, multiple sites: Secondary | ICD-10-CM | POA: Diagnosis not present

## 2015-08-25 DIAGNOSIS — M5136 Other intervertebral disc degeneration, lumbar region: Secondary | ICD-10-CM | POA: Diagnosis not present

## 2015-08-25 DIAGNOSIS — M9903 Segmental and somatic dysfunction of lumbar region: Secondary | ICD-10-CM | POA: Diagnosis not present

## 2015-08-25 DIAGNOSIS — M9912 Subluxation complex (vertebral) of thoracic region: Secondary | ICD-10-CM | POA: Diagnosis not present

## 2015-08-25 DIAGNOSIS — M9911 Subluxation complex (vertebral) of cervical region: Secondary | ICD-10-CM | POA: Diagnosis not present

## 2015-08-25 DIAGNOSIS — M9913 Subluxation complex (vertebral) of lumbar region: Secondary | ICD-10-CM | POA: Diagnosis not present

## 2015-09-17 DIAGNOSIS — R202 Paresthesia of skin: Secondary | ICD-10-CM | POA: Diagnosis not present

## 2015-09-17 DIAGNOSIS — I10 Essential (primary) hypertension: Secondary | ICD-10-CM | POA: Diagnosis not present

## 2015-09-17 DIAGNOSIS — E1151 Type 2 diabetes mellitus with diabetic peripheral angiopathy without gangrene: Secondary | ICD-10-CM | POA: Diagnosis not present

## 2015-09-29 DIAGNOSIS — M9913 Subluxation complex (vertebral) of lumbar region: Secondary | ICD-10-CM | POA: Diagnosis not present

## 2015-09-29 DIAGNOSIS — M5136 Other intervertebral disc degeneration, lumbar region: Secondary | ICD-10-CM | POA: Diagnosis not present

## 2015-09-29 DIAGNOSIS — M9911 Subluxation complex (vertebral) of cervical region: Secondary | ICD-10-CM | POA: Diagnosis not present

## 2015-09-29 DIAGNOSIS — M9912 Subluxation complex (vertebral) of thoracic region: Secondary | ICD-10-CM | POA: Diagnosis not present

## 2015-09-29 DIAGNOSIS — M9903 Segmental and somatic dysfunction of lumbar region: Secondary | ICD-10-CM | POA: Diagnosis not present

## 2015-10-14 DIAGNOSIS — Z1231 Encounter for screening mammogram for malignant neoplasm of breast: Secondary | ICD-10-CM | POA: Diagnosis not present

## 2015-10-20 DIAGNOSIS — M5136 Other intervertebral disc degeneration, lumbar region: Secondary | ICD-10-CM | POA: Diagnosis not present

## 2015-10-20 DIAGNOSIS — M9903 Segmental and somatic dysfunction of lumbar region: Secondary | ICD-10-CM | POA: Diagnosis not present

## 2015-11-02 IMAGING — CR DG ELBOW COMPLETE 3+V*L*
4 series · 4 of 4 positions shown · non-contrast
Comparison: None.

CLINICAL DATA: Fall.  Humerus and elbow pain.

EXAM:
LEFT ELBOW - COMPLETE 3+ VIEW

[x elbow ap left]
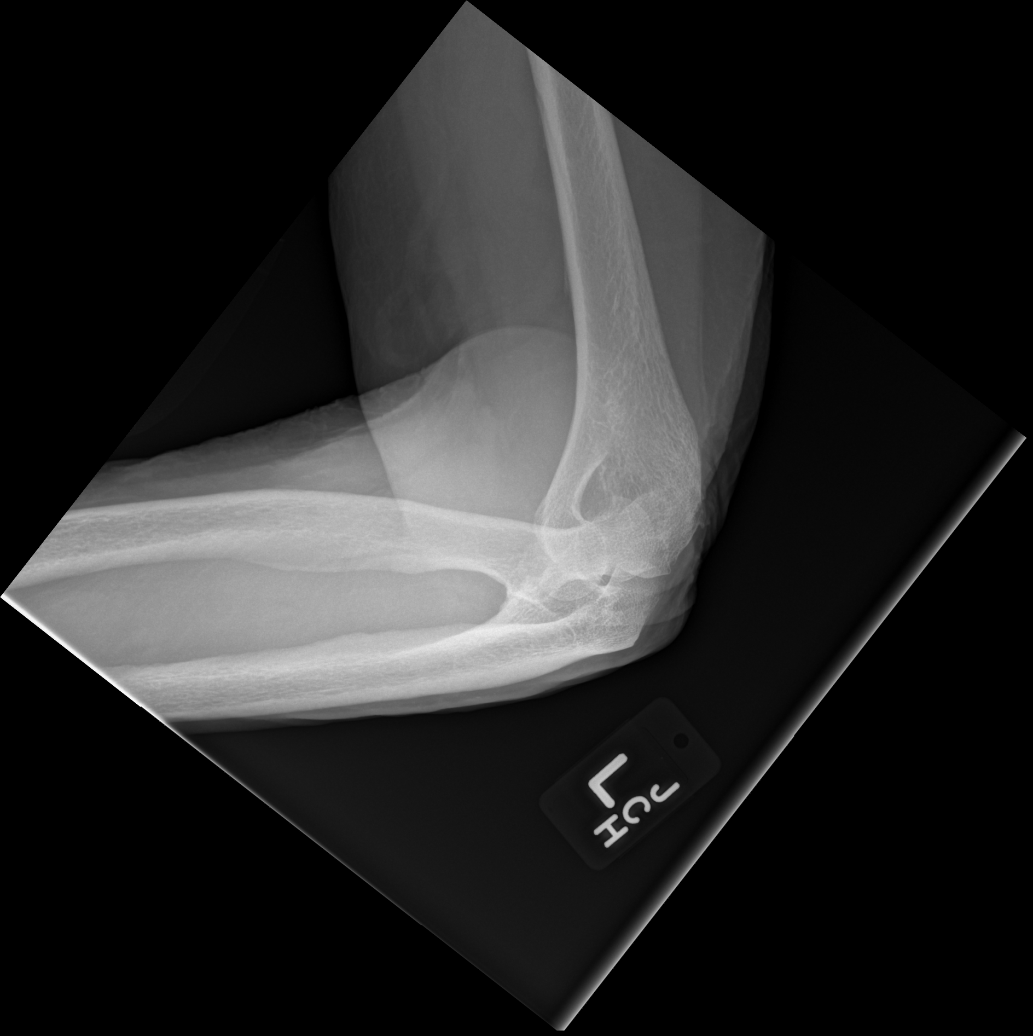

[x elbow obl left]
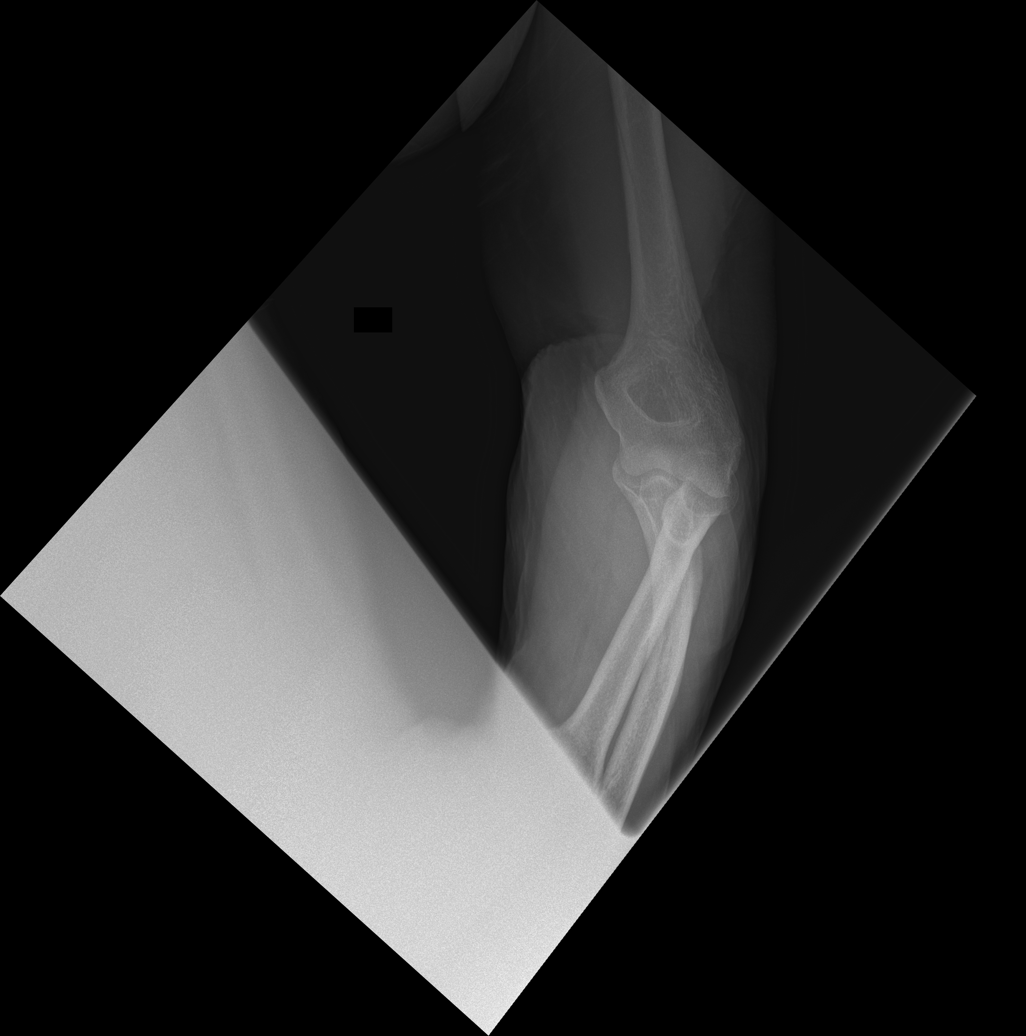

[x elbow lat left (1 of 2)]
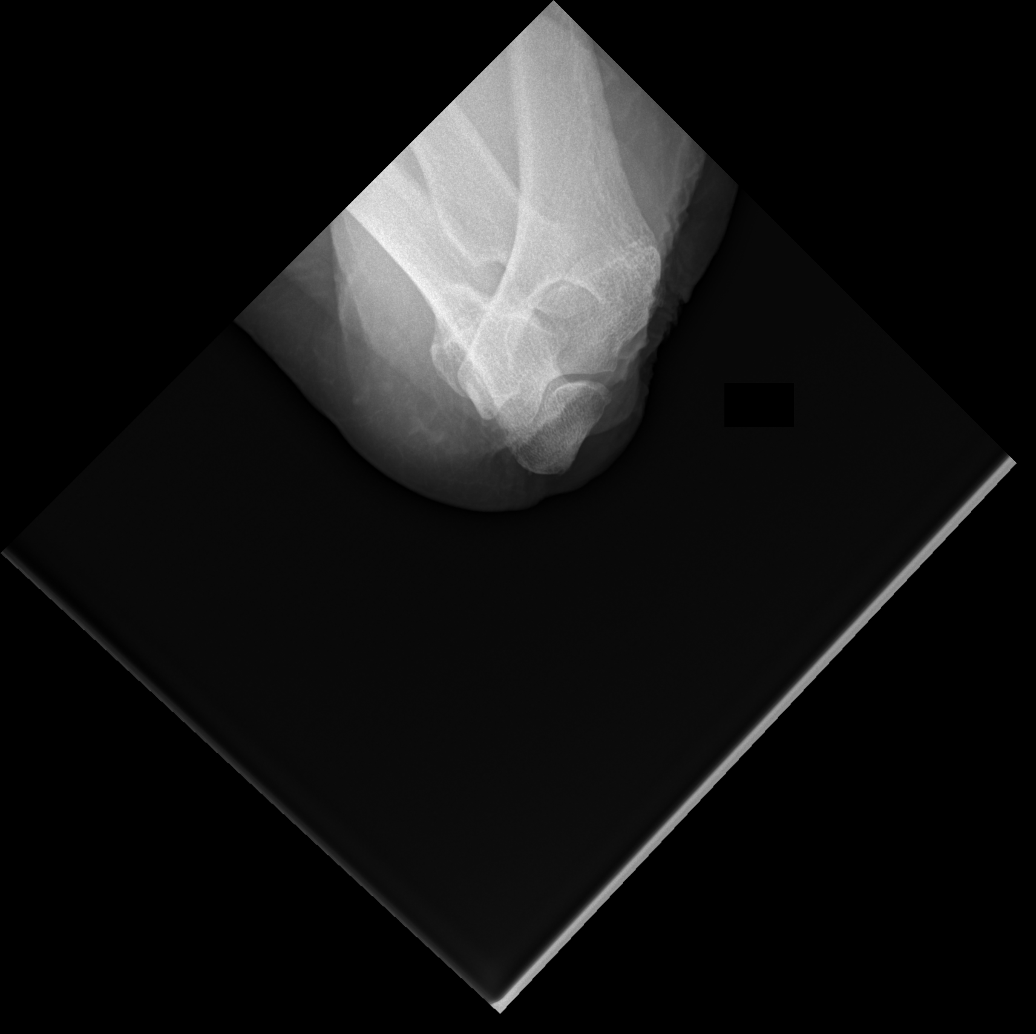

[x elbow lat left (2 of 2)]
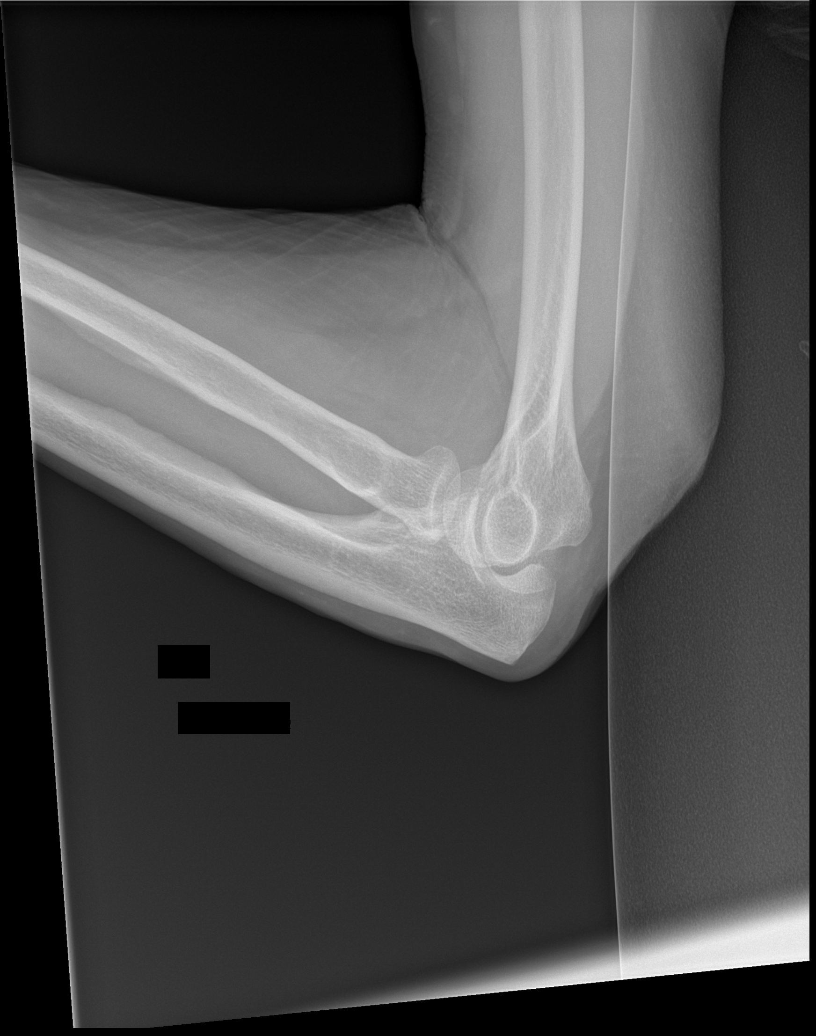

[4 of 4 positions shown; findings below may reference images not displayed]

FINDINGS: Positioning is suboptimal, but allowing for this, there is no
fracture or dislocation. No arthropathic changes. No joint effusion
is evident.
IMPRESSION: Negative.

## 2015-11-18 DIAGNOSIS — L570 Actinic keratosis: Secondary | ICD-10-CM | POA: Diagnosis not present

## 2015-11-18 DIAGNOSIS — X32XXXD Exposure to sunlight, subsequent encounter: Secondary | ICD-10-CM | POA: Diagnosis not present

## 2015-12-28 DIAGNOSIS — M9903 Segmental and somatic dysfunction of lumbar region: Secondary | ICD-10-CM | POA: Diagnosis not present

## 2015-12-28 DIAGNOSIS — M5136 Other intervertebral disc degeneration, lumbar region: Secondary | ICD-10-CM | POA: Diagnosis not present

## 2015-12-31 DIAGNOSIS — M9903 Segmental and somatic dysfunction of lumbar region: Secondary | ICD-10-CM | POA: Diagnosis not present

## 2015-12-31 DIAGNOSIS — M5136 Other intervertebral disc degeneration, lumbar region: Secondary | ICD-10-CM | POA: Diagnosis not present

## 2016-02-09 DIAGNOSIS — Z23 Encounter for immunization: Secondary | ICD-10-CM | POA: Diagnosis not present

## 2016-03-14 DIAGNOSIS — I700 Atherosclerosis of aorta: Secondary | ICD-10-CM | POA: Diagnosis not present

## 2016-03-14 DIAGNOSIS — E11319 Type 2 diabetes mellitus with unspecified diabetic retinopathy without macular edema: Secondary | ICD-10-CM | POA: Diagnosis not present

## 2016-03-14 DIAGNOSIS — J449 Chronic obstructive pulmonary disease, unspecified: Secondary | ICD-10-CM | POA: Diagnosis not present

## 2016-03-14 DIAGNOSIS — E782 Mixed hyperlipidemia: Secondary | ICD-10-CM | POA: Diagnosis not present

## 2016-03-14 DIAGNOSIS — I10 Essential (primary) hypertension: Secondary | ICD-10-CM | POA: Diagnosis not present

## 2016-03-14 DIAGNOSIS — R8290 Unspecified abnormal findings in urine: Secondary | ICD-10-CM | POA: Diagnosis not present

## 2016-03-14 DIAGNOSIS — I739 Peripheral vascular disease, unspecified: Secondary | ICD-10-CM | POA: Diagnosis not present

## 2016-03-14 DIAGNOSIS — E1151 Type 2 diabetes mellitus with diabetic peripheral angiopathy without gangrene: Secondary | ICD-10-CM | POA: Diagnosis not present

## 2016-04-01 DIAGNOSIS — H353111 Nonexudative age-related macular degeneration, right eye, early dry stage: Secondary | ICD-10-CM | POA: Diagnosis not present

## 2016-04-01 DIAGNOSIS — E113291 Type 2 diabetes mellitus with mild nonproliferative diabetic retinopathy without macular edema, right eye: Secondary | ICD-10-CM | POA: Diagnosis not present

## 2016-04-01 DIAGNOSIS — H26492 Other secondary cataract, left eye: Secondary | ICD-10-CM | POA: Diagnosis not present

## 2016-04-01 DIAGNOSIS — H52203 Unspecified astigmatism, bilateral: Secondary | ICD-10-CM | POA: Diagnosis not present

## 2016-09-19 ENCOUNTER — Emergency Department (HOSPITAL_COMMUNITY): Payer: Medicare Other

## 2016-09-19 ENCOUNTER — Inpatient Hospital Stay (HOSPITAL_COMMUNITY)
Admission: EM | Admit: 2016-09-19 | Discharge: 2016-09-23 | DRG: 871 | Disposition: A | Discharge status: Home Health | Payer: Medicare Other | Attending: Internal Medicine | Admitting: Internal Medicine

## 2016-09-19 ENCOUNTER — Encounter (HOSPITAL_COMMUNITY): Payer: Self-pay | Admitting: Emergency Medicine

## 2016-09-19 DIAGNOSIS — I491 Atrial premature depolarization: Secondary | ICD-10-CM | POA: Diagnosis present

## 2016-09-19 DIAGNOSIS — A4151 Sepsis due to Escherichia coli [E. coli]: Principal | ICD-10-CM | POA: Diagnosis present

## 2016-09-19 DIAGNOSIS — R55 Syncope and collapse: Secondary | ICD-10-CM | POA: Diagnosis present

## 2016-09-19 DIAGNOSIS — I358 Other nonrheumatic aortic valve disorders: Secondary | ICD-10-CM | POA: Diagnosis present

## 2016-09-19 DIAGNOSIS — M810 Age-related osteoporosis without current pathological fracture: Secondary | ICD-10-CM | POA: Diagnosis present

## 2016-09-19 DIAGNOSIS — I5033 Acute on chronic diastolic (congestive) heart failure: Secondary | ICD-10-CM | POA: Diagnosis present

## 2016-09-19 DIAGNOSIS — R7989 Other specified abnormal findings of blood chemistry: Secondary | ICD-10-CM | POA: Diagnosis present

## 2016-09-19 DIAGNOSIS — I5032 Chronic diastolic (congestive) heart failure: Secondary | ICD-10-CM | POA: Diagnosis present

## 2016-09-19 DIAGNOSIS — Z7982 Long term (current) use of aspirin: Secondary | ICD-10-CM

## 2016-09-19 DIAGNOSIS — Z79899 Other long term (current) drug therapy: Secondary | ICD-10-CM

## 2016-09-19 DIAGNOSIS — Z1611 Resistance to penicillins: Secondary | ICD-10-CM | POA: Diagnosis present

## 2016-09-19 DIAGNOSIS — Z66 Do not resuscitate: Secondary | ICD-10-CM | POA: Diagnosis not present

## 2016-09-19 DIAGNOSIS — I517 Cardiomegaly: Secondary | ICD-10-CM | POA: Diagnosis not present

## 2016-09-19 DIAGNOSIS — I509 Heart failure, unspecified: Secondary | ICD-10-CM

## 2016-09-19 DIAGNOSIS — Z808 Family history of malignant neoplasm of other organs or systems: Secondary | ICD-10-CM

## 2016-09-19 DIAGNOSIS — E785 Hyperlipidemia, unspecified: Secondary | ICD-10-CM | POA: Diagnosis present

## 2016-09-19 DIAGNOSIS — E861 Hypovolemia: Secondary | ICD-10-CM | POA: Diagnosis present

## 2016-09-19 DIAGNOSIS — N390 Urinary tract infection, site not specified: Secondary | ICD-10-CM | POA: Diagnosis present

## 2016-09-19 DIAGNOSIS — Z8249 Family history of ischemic heart disease and other diseases of the circulatory system: Secondary | ICD-10-CM

## 2016-09-19 DIAGNOSIS — W1811XA Fall from or off toilet without subsequent striking against object, initial encounter: Secondary | ICD-10-CM | POA: Diagnosis present

## 2016-09-19 DIAGNOSIS — J449 Chronic obstructive pulmonary disease, unspecified: Secondary | ICD-10-CM | POA: Diagnosis present

## 2016-09-19 DIAGNOSIS — Z7984 Long term (current) use of oral hypoglycemic drugs: Secondary | ICD-10-CM

## 2016-09-19 DIAGNOSIS — I951 Orthostatic hypotension: Secondary | ICD-10-CM | POA: Diagnosis present

## 2016-09-19 DIAGNOSIS — Y92012 Bathroom of single-family (private) house as the place of occurrence of the external cause: Secondary | ICD-10-CM

## 2016-09-19 DIAGNOSIS — Z881 Allergy status to other antibiotic agents status: Secondary | ICD-10-CM

## 2016-09-19 DIAGNOSIS — E1065 Type 1 diabetes mellitus with hyperglycemia: Secondary | ICD-10-CM | POA: Diagnosis not present

## 2016-09-19 DIAGNOSIS — R140 Abdominal distension (gaseous): Secondary | ICD-10-CM

## 2016-09-19 DIAGNOSIS — E860 Dehydration: Secondary | ICD-10-CM | POA: Diagnosis present

## 2016-09-19 DIAGNOSIS — E871 Hypo-osmolality and hyponatremia: Secondary | ICD-10-CM | POA: Diagnosis present

## 2016-09-19 DIAGNOSIS — I110 Hypertensive heart disease with heart failure: Secondary | ICD-10-CM | POA: Diagnosis present

## 2016-09-19 DIAGNOSIS — R000 Tachycardia, unspecified: Secondary | ICD-10-CM | POA: Diagnosis present

## 2016-09-19 DIAGNOSIS — A419 Sepsis, unspecified organism: Secondary | ICD-10-CM | POA: Diagnosis present

## 2016-09-19 DIAGNOSIS — Z888 Allergy status to other drugs, medicaments and biological substances status: Secondary | ICD-10-CM

## 2016-09-19 DIAGNOSIS — I10 Essential (primary) hypertension: Secondary | ICD-10-CM | POA: Diagnosis present

## 2016-09-19 DIAGNOSIS — K5900 Constipation, unspecified: Secondary | ICD-10-CM | POA: Diagnosis present

## 2016-09-19 DIAGNOSIS — Z794 Long term (current) use of insulin: Secondary | ICD-10-CM

## 2016-09-19 DIAGNOSIS — R778 Other specified abnormalities of plasma proteins: Secondary | ICD-10-CM | POA: Diagnosis present

## 2016-09-19 DIAGNOSIS — E118 Type 2 diabetes mellitus with unspecified complications: Secondary | ICD-10-CM | POA: Diagnosis present

## 2016-09-19 DIAGNOSIS — Z885 Allergy status to narcotic agent status: Secondary | ICD-10-CM

## 2016-09-19 LAB — CBC
HEMATOCRIT: 36.7 % (ref 36.0–46.0)
Hemoglobin: 12.4 g/dL (ref 12.0–15.0)
MCH: 33.4 pg (ref 26.0–34.0)
MCHC: 33.8 g/dL (ref 30.0–36.0)
MCV: 98.9 fL (ref 78.0–100.0)
Platelets: 152 10*3/uL (ref 150–400)
RBC: 3.71 MIL/uL — ABNORMAL LOW (ref 3.87–5.11)
RDW: 12.8 % (ref 11.5–15.5)
WBC: 18.2 10*3/uL — AB (ref 4.0–10.5)

## 2016-09-19 LAB — BASIC METABOLIC PANEL
Anion gap: 11 (ref 5–15)
BUN: 20 mg/dL (ref 6–20)
CHLORIDE: 96 mmol/L — AB (ref 101–111)
CO2: 21 mmol/L — AB (ref 22–32)
CREATININE: 1 mg/dL (ref 0.44–1.00)
Calcium: 8.4 mg/dL — ABNORMAL LOW (ref 8.9–10.3)
GFR calc Af Amer: 56 mL/min — ABNORMAL LOW (ref >60)
GFR calc non Af Amer: 48 mL/min — ABNORMAL LOW (ref >60)
Glucose, Bld: 265 mg/dL — ABNORMAL HIGH (ref 65–99)
POTASSIUM: 4 mmol/L (ref 3.5–5.1)
SODIUM: 128 mmol/L — AB (ref 135–145)

## 2016-09-19 LAB — CBG MONITORING, ED: Glucose-Capillary: 272 mg/dL — ABNORMAL HIGH (ref 65–99)

## 2016-09-19 NOTE — ED Provider Notes (Signed)
[facility]-EMERGENCY DEPT Provider Note   CSN: [id number] Arrival date & time: 09/19/16  2120  By signing my name below, I, [name], attest that this documentation has been prepared under the direction and in the presence of Ferrel Simington, MD . Electronically Signed: Nelwyn [name], Scribe. 09/19/2016. 11:09 PM.  History   Chief Complaint Chief Complaint  Patient presents with  . Loss of Consciousness   The history is provided by the patient. No language interpreter was used.  Loss of Consciousness   This is a new problem. The current episode started 3 to 5 hours ago. The problem occurs rarely. The problem has not changed since onset.She lost consciousness for a period of greater than 5 minutes. The problem is associated with bowel movements and standing up. Associated symptoms include dizziness and vertigo. Pertinent negatives include chest pain, congestion, fever and palpitations. She has tried nothing for the symptoms. The treatment provided no relief.    HPI Comments:  [name] is a 83 y.o. female with pmhx of COPD, DM, HLD and HTN who presents to the Emergency Department after one unwitnessed and one witnessed syncopal episode and fall about 6 hours ago. Pt states that she was using the restroom and the next thing she remembers, she was waking up on the floor. Pt's daughter was suspicious because her mother did not answer her phone, and so she called another family member to check on her mother. After a short period of time, the family was able to get into the house and found the pt on the floor of the bathroom. Family note that while they were in the house with the pt she lost consciousness again, which led them to come to the ED. They report that the pt has been more pale lately and has been confused. No modifying factors indicated. No medications PTA. Denies any cough, congestion, or dysuria.   Past Medical History:  Diagnosis Date  . Aortic atherosclerosis (HCC)    on [place]  05/13/13  . Aortic dissection (HCC)    Dr. Morton Peters  . Chronic cystitis    Dr. Sherron Monday  . COPD (chronic obstructive pulmonary disease) (HCC)   . Diabetes mellitus without complication (HCC)   . Hyperlipidemia   . Hypertension   . Osteoporosis    giant cell arteritis, Dr. Kellie Simmering    Patient Active Problem List   Diagnosis Date Noted  . Palpitations 08/01/2013    Past Surgical History:  Procedure Laterality Date  . aortic acrh surgery      OB History    No data available       Home Medications    Prior to Admission medications   Medication Sig Start Date End Date Taking? Authorizing Provider  aspirin 81 MG tablet Take 81 mg by mouth every morning.    Yes Historical Provider, MD  atorvastatin (LIPITOR) 20 MG tablet Take 20 mg by mouth every morning.    Yes Historical Provider, MD  calcium carbonate 200 MG capsule Take 2,000 mg by mouth every morning.    Yes Historical Provider, MD  Cholecalciferol (VITAMIN D) 2000 UNITS tablet Take 2,000 Units by mouth every morning.    Yes Historical Provider, MD  furosemide (LASIX) 20 MG tablet Take 20 mg by mouth every morning.    Yes Historical Provider, MD  metFORMIN (GLUCOPHAGE) 500 MG tablet Take 500 mg by mouth 2 (two) times daily. 08/29/16  Yes Historical Provider, MD  metoprolol succinate (TOPROL-XL) 50 MG 24 hr tablet Take  50 mg by mouth daily. Take with or immediately following a meal.   Yes Historical Provider, MD  Multiple Vitamins-Minerals (MULTIVITAMIN PO) Take 1 tablet by mouth every morning.    Yes Historical Provider, MD  Omega-3 Fatty Acids (FISH OIL) 1000 MG CAPS Take 1 capsule by mouth every morning.    Yes Historical Provider, MD  [facility] 625 MG tablet Take 1,875 mg by mouth 2 (two) times daily. 08/31/16  Yes Historical Provider, MD    Family History History reviewed. No pertinent family history.  Social History Social History  Substance Use Topics  . Smoking status: Never Smoker  . Smokeless tobacco: Never  Used  . Alcohol use No     Allergies   Actonel [risedronate sodium]; Codeine; Lipitor [atorvastatin]; Macrobid [nitrofurantoin monohyd macro]; and Septra [sulfamethoxazole-trimethoprim]   Review of Systems Review of Systems  Constitutional: Negative for chills and fever.  HENT: Negative for congestion.   Respiratory: Negative for cough and shortness of breath.   Cardiovascular: Positive for syncope. Negative for chest pain and palpitations.  Genitourinary: Negative for dysuria.  Skin: Positive for color change.  Neurological: Positive for dizziness, vertigo and syncope.  Psychiatric/Behavioral:       Family reports confusion  All other systems reviewed and are negative.    Physical Exam Updated Vital Signs BP 110/62 (BP Location: Left Arm)   Pulse 92   Temp 97.9 F (36.6 C) (Oral)   Resp 18   SpO2 96%   Physical Exam  Constitutional: She appears well-developed and well-nourished. No distress.  HENT:  Head: Normocephalic and atraumatic.  Mouth/Throat: Oropharynx is clear and moist. No oropharyngeal exudate.  Eyes: Conjunctivae are normal. Pupils are equal, round, and reactive to light.  Neck: Carotid bruit is not present.  Cardiovascular: Normal rate, regular rhythm, normal heart sounds and intact distal pulses.   Pulmonary/Chest: Effort normal and breath sounds normal. No stridor.  Abdominal: Soft. Bowel sounds are normal. There is no rebound and no guarding.  Neurological: She is alert. She has normal reflexes.  Skin: Skin is warm and dry.  Psychiatric: She has a normal mood and affect. Her behavior is normal.  Nursing note and vitals reviewed.    ED Treatments / Results  DIAGNOSTIC STUDIES:  Oxygen Saturation is 96% on RA, normal by my interpretation.    COORDINATION OF CARE:  11:14 PM Discussed treatment plan with pt at bedside which includes Urinalysis and imaging and pt agreed to plan.  Labs (all labs ordered are listed, but only abnormal results are  displayed) Results for orders placed or performed during the hospital encounter of 09/19/16  Basic metabolic panel  Result Value Ref Range   Sodium 128 (L) 135 - 145 mmol/L   Potassium 4.0 3.5 - 5.1 mmol/L   Chloride 96 (L) 101 - 111 mmol/L   CO2 21 (L) 22 - 32 mmol/L   Glucose, Bld 265 (H) 65 - 99 mg/dL   BUN 20 6 - 20 mg/dL   Creatinine, Ser 1.61 0.44 - 1.00 mg/dL   Calcium 8.4 (L) 8.9 - 10.3 mg/dL   GFR calc non Af Amer 48 (L) >60 mL/min   GFR calc Af Amer 56 (L) >60 mL/min   Anion gap 11 5 - 15  CBC  Result Value Ref Range   WBC 18.2 (H) 4.0 - 10.5 K/uL   RBC 3.71 (L) 3.87 - 5.11 MIL/uL   Hemoglobin 12.4 12.0 - 15.0 g/dL   HCT 09.6 04.5 - 40.9 %  MCV 98.9 78.0 - 100.0 fL   MCH 33.4 26.0 - 34.0 pg   MCHC 33.8 30.0 - 36.0 g/dL   RDW 16.112.8 09.611.5 - 04.515.5 %   Platelets 152 150 - 400 K/uL  Urinalysis, Routine w reflex microscopic  Result Value Ref Range   Color, Urine AMBER (A) YELLOW   APPearance TURBID (A) CLEAR   Specific Gravity, Urine 1.014 1.005 - 1.030   pH 5.0 5.0 - 8.0   Glucose, UA NEGATIVE NEGATIVE mg/dL   Hgb urine dipstick SMALL (A) NEGATIVE   Bilirubin Urine NEGATIVE NEGATIVE   Ketones, ur 20 (A) NEGATIVE mg/dL   Protein, ur 409100 (A) NEGATIVE mg/dL   Nitrite NEGATIVE NEGATIVE   Leukocytes, UA LARGE (A) NEGATIVE   RBC / HPF 6-30 0 - 5 RBC/hpf   WBC, UA TOO NUMEROUS TO COUNT 0 - 5 WBC/hpf   Bacteria, UA RARE (A) NONE SEEN   Squamous Epithelial / LPF 0-5 (A) NONE SEEN   WBC Clumps PRESENT    Mucous PRESENT    Hyaline Casts, UA PRESENT    Non Squamous Epithelial 0-5 (A) NONE SEEN  Troponin I  Result Value Ref Range   Troponin I 0.03 (HH) <0.03 ng/mL  CBG monitoring, ED  Result Value Ref Range   Glucose-Capillary 272 (H) 65 - 99 mg/dL   Dg Chest 2 View  Result Date: 09/20/2016 CLINICAL DATA:  83 y/o  F; syncope. EXAM: CHEST  2 VIEW COMPARISON:  02/23/2011 chest radiograph FINDINGS: Mild cardiomegaly. Aortic atherosclerosis with calcification or. Hazy  opacification of lung bases may represent atelectasis or mild edema. Underlying pneumonia is not excluded. Post median sternotomy with wires in alignment. Multilevel degenerative changes of the spine. IMPRESSION: Mild cardiomegaly. Hazy opacities at lung bases probably represent atelectasis or mild edema. Underlying pneumonia is not excluded. Electronically Signed   By: Mitzi HansenLance  Furusawa-Stratton M.D.   On: 09/20/2016 00:04   Ct Head Wo Contrast  Result Date: 09/20/2016 CLINICAL DATA:  Syncopal episodes. EXAM: CT HEAD WITHOUT CONTRAST TECHNIQUE: Contiguous axial images were obtained from the base of the skull through the vertex without intravenous contrast. COMPARISON:  MRI from 07/28/2006 FINDINGS: Brain: Moderate central atrophy with mild to moderate chronic appearing small vessel ischemic disease of periventricular and subcortical white matter. No definite intra-axial mass. No acute intracranial hemorrhage, midline shift or edema. No extra-axial fluid collections. Vascular: Atherosclerosis of the carotid siphons and both vertebral arteries. Skull: Negative for acute fracture or focal lesions. Sinuses/Orbits: Clear bilateral mastoids and paranasal sinuses. Intact globes. Other: None. IMPRESSION: Atrophy with mild to moderate chronic appearing small vessel ischemic disease of periventricular subcortical white matter. No acute intracranial abnormality. Electronically Signed   By: Tollie Ethavid  Kwon M.D.   On: 09/20/2016 00:09      EKG Interpretation  Date/Time:  Monday September 19 2016 21:47:46 EDT Ventricular Rate:  81 PR Interval:    QRS Duration: 92 QT Interval:  401 QTC Calculation: 466 R Axis:   64 Text Interpretation:  Sinus rhythm Atrial premature complexes Confirmed by [facility]  MD, Joydan Gretzinger ([id number]) on 09/19/2016 11:10:02 PM      Procedures Procedures (including critical care time)  Medications Ordered in ED  Medications  0.9 %  sodium chloride infusion ( Intravenous New Bag/Given 09/20/16  0039)  cefTRIAXone (ROCEPHIN) 1 g in dextrose 5 % 50 mL IVPB (1 g Intravenous New Bag/Given 09/20/16 0258)  sodium chloride 0.9 % bolus 500 mL (0 mLs Intravenous Stopped 09/20/16 0148)  Final Clinical Impressions(s) / ED Diagnoses  UTI and syncope: will admit to medicine  I personally performed the services described in this documentation, which was scribed in my presence. The recorded information has been reviewed and is accurate.      Cy Blamer, MD 09/20/16 [phone]

## 2016-09-19 NOTE — ED Triage Notes (Signed)
Pt states she feels fine  Family states pt has been pale for the past couple of days, has had some dizziness/vertigo type symptoms, confusion, dark tarry stools, and had a syncopal episode today

## 2016-09-20 ENCOUNTER — Inpatient Hospital Stay (HOSPITAL_COMMUNITY): Payer: Medicare Other

## 2016-09-20 ENCOUNTER — Encounter (HOSPITAL_COMMUNITY): Payer: Self-pay | Admitting: Emergency Medicine

## 2016-09-20 DIAGNOSIS — E871 Hypo-osmolality and hyponatremia: Secondary | ICD-10-CM | POA: Diagnosis present

## 2016-09-20 DIAGNOSIS — I110 Hypertensive heart disease with heart failure: Secondary | ICD-10-CM | POA: Diagnosis present

## 2016-09-20 DIAGNOSIS — Z8249 Family history of ischemic heart disease and other diseases of the circulatory system: Secondary | ICD-10-CM | POA: Diagnosis not present

## 2016-09-20 DIAGNOSIS — I5031 Acute diastolic (congestive) heart failure: Secondary | ICD-10-CM

## 2016-09-20 DIAGNOSIS — E785 Hyperlipidemia, unspecified: Secondary | ICD-10-CM | POA: Diagnosis present

## 2016-09-20 DIAGNOSIS — Y92012 Bathroom of single-family (private) house as the place of occurrence of the external cause: Secondary | ICD-10-CM | POA: Diagnosis not present

## 2016-09-20 DIAGNOSIS — E860 Dehydration: Secondary | ICD-10-CM | POA: Diagnosis not present

## 2016-09-20 DIAGNOSIS — Z7982 Long term (current) use of aspirin: Secondary | ICD-10-CM | POA: Diagnosis not present

## 2016-09-20 DIAGNOSIS — K5900 Constipation, unspecified: Secondary | ICD-10-CM | POA: Diagnosis present

## 2016-09-20 DIAGNOSIS — E1065 Type 1 diabetes mellitus with hyperglycemia: Secondary | ICD-10-CM | POA: Diagnosis present

## 2016-09-20 DIAGNOSIS — Z794 Long term (current) use of insulin: Secondary | ICD-10-CM | POA: Diagnosis not present

## 2016-09-20 DIAGNOSIS — M810 Age-related osteoporosis without current pathological fracture: Secondary | ICD-10-CM | POA: Diagnosis present

## 2016-09-20 DIAGNOSIS — Z881 Allergy status to other antibiotic agents status: Secondary | ICD-10-CM | POA: Diagnosis not present

## 2016-09-20 DIAGNOSIS — Z79899 Other long term (current) drug therapy: Secondary | ICD-10-CM | POA: Diagnosis not present

## 2016-09-20 DIAGNOSIS — J449 Chronic obstructive pulmonary disease, unspecified: Secondary | ICD-10-CM | POA: Diagnosis present

## 2016-09-20 DIAGNOSIS — N3000 Acute cystitis without hematuria: Secondary | ICD-10-CM | POA: Diagnosis not present

## 2016-09-20 DIAGNOSIS — Z7984 Long term (current) use of oral hypoglycemic drugs: Secondary | ICD-10-CM | POA: Diagnosis not present

## 2016-09-20 DIAGNOSIS — N390 Urinary tract infection, site not specified: Secondary | ICD-10-CM | POA: Diagnosis not present

## 2016-09-20 DIAGNOSIS — I10 Essential (primary) hypertension: Secondary | ICD-10-CM | POA: Diagnosis present

## 2016-09-20 DIAGNOSIS — Z66 Do not resuscitate: Secondary | ICD-10-CM | POA: Diagnosis present

## 2016-09-20 DIAGNOSIS — R55 Syncope and collapse: Secondary | ICD-10-CM | POA: Diagnosis not present

## 2016-09-20 DIAGNOSIS — E118 Type 2 diabetes mellitus with unspecified complications: Secondary | ICD-10-CM | POA: Diagnosis not present

## 2016-09-20 DIAGNOSIS — A4151 Sepsis due to Escherichia coli [E. coli]: Secondary | ICD-10-CM | POA: Diagnosis not present

## 2016-09-20 DIAGNOSIS — R748 Abnormal levels of other serum enzymes: Secondary | ICD-10-CM

## 2016-09-20 DIAGNOSIS — I5033 Acute on chronic diastolic (congestive) heart failure: Secondary | ICD-10-CM | POA: Diagnosis not present

## 2016-09-20 DIAGNOSIS — R7989 Other specified abnormal findings of blood chemistry: Secondary | ICD-10-CM

## 2016-09-20 DIAGNOSIS — R778 Other specified abnormalities of plasma proteins: Secondary | ICD-10-CM | POA: Diagnosis present

## 2016-09-20 DIAGNOSIS — I951 Orthostatic hypotension: Secondary | ICD-10-CM | POA: Diagnosis present

## 2016-09-20 DIAGNOSIS — I5032 Chronic diastolic (congestive) heart failure: Secondary | ICD-10-CM | POA: Diagnosis present

## 2016-09-20 DIAGNOSIS — E861 Hypovolemia: Secondary | ICD-10-CM | POA: Diagnosis present

## 2016-09-20 DIAGNOSIS — E119 Type 2 diabetes mellitus without complications: Secondary | ICD-10-CM | POA: Diagnosis not present

## 2016-09-20 DIAGNOSIS — A419 Sepsis, unspecified organism: Secondary | ICD-10-CM

## 2016-09-20 DIAGNOSIS — R000 Tachycardia, unspecified: Secondary | ICD-10-CM | POA: Diagnosis present

## 2016-09-20 DIAGNOSIS — R1084 Generalized abdominal pain: Secondary | ICD-10-CM | POA: Diagnosis not present

## 2016-09-20 DIAGNOSIS — Z808 Family history of malignant neoplasm of other organs or systems: Secondary | ICD-10-CM | POA: Diagnosis not present

## 2016-09-20 DIAGNOSIS — Z885 Allergy status to narcotic agent status: Secondary | ICD-10-CM | POA: Diagnosis not present

## 2016-09-20 DIAGNOSIS — Z888 Allergy status to other drugs, medicaments and biological substances status: Secondary | ICD-10-CM | POA: Diagnosis not present

## 2016-09-20 DIAGNOSIS — W1811XA Fall from or off toilet without subsequent striking against object, initial encounter: Secondary | ICD-10-CM | POA: Diagnosis present

## 2016-09-20 LAB — GLUCOSE, CAPILLARY
GLUCOSE-CAPILLARY: 256 mg/dL — AB (ref 65–99)
Glucose-Capillary: 315 mg/dL — ABNORMAL HIGH (ref 65–99)
Glucose-Capillary: 201 mg/dL — ABNORMAL HIGH (ref 65–99)

## 2016-09-20 LAB — URINALYSIS, ROUTINE W REFLEX MICROSCOPIC
Bilirubin Urine: NEGATIVE
Glucose, UA: NEGATIVE mg/dL
Ketones, ur: 20 mg/dL — AB
NITRITE: NEGATIVE
PROTEIN: 100 mg/dL — AB
SPECIFIC GRAVITY, URINE: 1.014 (ref 1.005–1.030)
pH: 5 (ref 5.0–8.0)

## 2016-09-20 LAB — OSMOLALITY, URINE: Osmolality, Ur: 364 mOsm/kg (ref 300–900)

## 2016-09-20 LAB — TROPONIN I
TROPONIN I: 0.03 ng/mL — AB (ref <0.03)
TROPONIN I: 0.03 ng/mL — AB (ref <0.03)
Troponin I: 0.03 ng/mL (ref <0.03)

## 2016-09-20 LAB — BASIC METABOLIC PANEL
Anion gap: 10 (ref 5–15)
BUN: 19 mg/dL (ref 6–20)
CALCIUM: 8.6 mg/dL — AB (ref 8.9–10.3)
CO2: 25 mmol/L (ref 22–32)
CREATININE: 0.97 mg/dL (ref 0.44–1.00)
Chloride: 98 mmol/L — ABNORMAL LOW (ref 101–111)
GFR calc Af Amer: 58 mL/min — ABNORMAL LOW (ref >60)
GFR, EST NON AFRICAN AMERICAN: 50 mL/min — AB (ref >60)
Glucose, Bld: 192 mg/dL — ABNORMAL HIGH (ref 65–99)
Potassium: 4.1 mmol/L (ref 3.5–5.1)
SODIUM: 133 mmol/L — AB (ref 135–145)

## 2016-09-20 LAB — LACTIC ACID, PLASMA
Lactic Acid, Venous: 2.5 mmol/L (ref 0.5–1.9)
Lactic Acid, Venous: 1.7 mmol/L (ref 0.5–1.9)

## 2016-09-20 LAB — CBC
HEMATOCRIT: 37.5 % (ref 36.0–46.0)
Hemoglobin: 12.7 g/dL (ref 12.0–15.0)
MCH: 33.8 pg (ref 26.0–34.0)
MCHC: 33.9 g/dL (ref 30.0–36.0)
MCV: 99.7 fL (ref 78.0–100.0)
PLATELETS: 162 10*3/uL (ref 150–400)
RBC: 3.76 MIL/uL — ABNORMAL LOW (ref 3.87–5.11)
RDW: 12.8 % (ref 11.5–15.5)
WBC: 15.9 10*3/uL — AB (ref 4.0–10.5)

## 2016-09-20 LAB — ECHOCARDIOGRAM COMPLETE
Height: 64 in
WEIGHTICAEL: 2758.4 [oz_av]

## 2016-09-20 LAB — BRAIN NATRIURETIC PEPTIDE: B NATRIURETIC PEPTIDE 5: 623.5 pg/mL — AB (ref 0.0–100.0)

## 2016-09-20 LAB — OSMOLALITY: OSMOLALITY: 285 mosm/kg (ref 275–295)

## 2016-09-20 LAB — SODIUM, URINE, RANDOM: Sodium, Ur: 21 mmol/L

## 2016-09-20 MED ORDER — ALBUTEROL SULFATE (2.5 MG/3ML) 0.083% IN NEBU
INHALATION_SOLUTION | RESPIRATORY_TRACT | Status: AC
  Filled 2016-09-20: qty 3

## 2016-09-20 MED ORDER — SODIUM CHLORIDE 0.9% FLUSH
3.0000 mL | Freq: Two times a day (BID) | INTRAVENOUS | Status: DC
  Administered 2016-09-21 – 2016-09-23 (×4): 3 mL via INTRAVENOUS

## 2016-09-20 MED ORDER — ACETAMINOPHEN 650 MG RE SUPP: 650.0000 mg | Freq: Four times a day (QID) | RECTAL | Status: DC | PRN

## 2016-09-20 MED ORDER — SODIUM CHLORIDE 0.9 % IV SOLN
INTRAVENOUS | Status: DC
  Administered 2016-09-20: 13:00:00 via INTRAVENOUS

## 2016-09-20 MED ORDER — METOPROLOL SUCCINATE ER 50 MG PO TB24
50.0000 mg | ORAL_TABLET | Freq: Every day | ORAL | Status: DC
  Administered 2016-09-20: 50 mg via ORAL
  Filled 2016-09-20: qty 1

## 2016-09-20 MED ORDER — DEXTROSE 5 % IV SOLN
1.0000 g | INTRAVENOUS | Status: DC
  Administered 2016-09-21 – 2016-09-23 (×3): 1 g via INTRAVENOUS
  Filled 2016-09-20 (×3): qty 10

## 2016-09-20 MED ORDER — INSULIN ASPART 100 UNIT/ML ~~LOC~~ SOLN: 0.0000 [IU] | Freq: Every day | SUBCUTANEOUS | Status: DC

## 2016-09-20 MED ORDER — INSULIN ASPART 100 UNIT/ML ~~LOC~~ SOLN
0.0000 [IU] | Freq: Every day | SUBCUTANEOUS | Status: DC
  Administered 2016-09-20: 3 [IU] via SUBCUTANEOUS
  Administered 2016-09-21 – 2016-09-22 (×2): 2 [IU] via SUBCUTANEOUS

## 2016-09-20 MED ORDER — ACETAMINOPHEN 325 MG PO TABS
650.0000 mg | ORAL_TABLET | Freq: Four times a day (QID) | ORAL | Status: DC | PRN
  Administered 2016-09-20 – 2016-09-23 (×4): 650 mg via ORAL
  Filled 2016-09-20 (×4): qty 2

## 2016-09-20 MED ORDER — ALBUTEROL SULFATE (2.5 MG/3ML) 0.083% IN NEBU: 2.5000 mg | INHALATION_SOLUTION | RESPIRATORY_TRACT | Status: DC

## 2016-09-20 MED ORDER — ALBUTEROL SULFATE (2.5 MG/3ML) 0.083% IN NEBU
2.5000 mg | INHALATION_SOLUTION | Freq: Once | RESPIRATORY_TRACT | Status: AC
  Administered 2016-09-20: 2.5 mg via RESPIRATORY_TRACT

## 2016-09-20 MED ORDER — INSULIN ASPART 100 UNIT/ML ~~LOC~~ SOLN
0.0000 [IU] | Freq: Three times a day (TID) | SUBCUTANEOUS | Status: DC
  Administered 2016-09-20: 7 [IU] via SUBCUTANEOUS
  Administered 2016-09-21: 20 [IU] via SUBCUTANEOUS
  Administered 2016-09-21 (×2): 7 [IU] via SUBCUTANEOUS
  Administered 2016-09-22: 15 [IU] via SUBCUTANEOUS
  Administered 2016-09-22: 4 [IU] via SUBCUTANEOUS
  Administered 2016-09-22: 7 [IU] via SUBCUTANEOUS
  Administered 2016-09-23 (×2): 4 [IU] via SUBCUTANEOUS

## 2016-09-20 MED ORDER — ASPIRIN EC 81 MG PO TBEC
81.0000 mg | DELAYED_RELEASE_TABLET | Freq: Every day | ORAL | Status: DC
  Administered 2016-09-20 – 2016-09-23 (×4): 81 mg via ORAL
  Filled 2016-09-20 (×4): qty 1

## 2016-09-20 MED ORDER — ATORVASTATIN CALCIUM 20 MG PO TABS
20.0000 mg | ORAL_TABLET | Freq: Every day | ORAL | Status: DC
  Administered 2016-09-20: 20 mg via ORAL
  Filled 2016-09-20: qty 1

## 2016-09-20 MED ORDER — CEFTRIAXONE SODIUM 1 G IJ SOLR
1.0000 g | Freq: Once | INTRAMUSCULAR | Status: AC
  Administered 2016-09-20: 1 g via INTRAVENOUS
  Filled 2016-09-20: qty 10

## 2016-09-20 MED ORDER — INSULIN ASPART 100 UNIT/ML ~~LOC~~ SOLN
0.0000 [IU] | Freq: Three times a day (TID) | SUBCUTANEOUS | Status: DC
  Administered 2016-09-20: 3 [IU] via SUBCUTANEOUS
  Administered 2016-09-20: 7 [IU] via SUBCUTANEOUS

## 2016-09-20 MED ORDER — SODIUM CHLORIDE 0.9 % IV SOLN
INTRAVENOUS | Status: DC
  Administered 2016-09-20: 01:00:00 via INTRAVENOUS

## 2016-09-20 MED ORDER — SODIUM CHLORIDE 0.9 % IV BOLUS (SEPSIS)
500.0000 mL | Freq: Once | INTRAVENOUS | Status: AC
  Administered 2016-09-20: 500 mL via INTRAVENOUS

## 2016-09-20 MED ORDER — ENOXAPARIN SODIUM 40 MG/0.4ML ~~LOC~~ SOLN
40.0000 mg | SUBCUTANEOUS | Status: DC
  Administered 2016-09-20 – 2016-09-23 (×4): 40 mg via SUBCUTANEOUS
  Filled 2016-09-20 (×4): qty 0.4

## 2016-09-20 MED ORDER — ALBUTEROL SULFATE (2.5 MG/3ML) 0.083% IN NEBU
2.5000 mg | INHALATION_SOLUTION | Freq: Four times a day (QID) | RESPIRATORY_TRACT | Status: DC
  Administered 2016-09-21: 2.5 mg via RESPIRATORY_TRACT
  Filled 2016-09-20: qty 3

## 2016-09-20 MED ORDER — CEPHALEXIN 500 MG PO CAPS: 500.0000 mg | ORAL_CAPSULE | Freq: Two times a day (BID) | ORAL | Status: DC

## 2016-09-20 NOTE — H&P (Signed)
History and Physical  Patient Name: [name]     WUJ:[id number]    DOB: December 10, 1923    DOA: 09/19/2016 PCP: Lupita RaiderSHAW,KIMBERLEE, MD   Patient coming from: Home  Chief Complaint: Syncope, collapse  HPI: Mikayla Modelsie B Schey is a 83 y.o. female with a past medical history significant for NIDDM, hx of aortic dissection s/p repair, and HTN who presents with collapse.  The patient was in her normal state of health until today, when she felt "weak all over" and tired. Once, she stood from a chair and "my legs buckled underneath me" but she was able to get up after.  Then around 5PM, she stood up from the commode, took two steps and her legs buckled again and this time she thinks she passed out. When she came to, she scooted herself on her bottom all the way to the phone, but then passed out, and when she came to this time (she is vague exactly), it was around 7:30PM, she had soiled herslef, and family had arrived because she wasn't answering her phone.  She had no chest pain or pressure, no tachycardia, no dyspnea before passing out.  She had no focal weakness, slurred speech, facial asymmetry.  She still felt weak all over when she awoke.  No fever, chills, vomiting, diarrhea.  ED course: -Afebrile, heart rate 92, respirations and pulse ox normal, BP 110/62 -Orthostatic vitals normal -Na 128 (baseline unknown), K 4.0, Cr 1.0 (baseline unknown), WBC 18.2K, Hgb 12.4 -Troponin 0.03 -CXR shows bilateral lower atelectasis vs edema -UA showed WBC TNTC -CT head normal for age -ECG showed normal sinus rhythm, with PACs -She was given 500cc NS and ceftriaxone for UTI and TRH were asked to evaluate        ROS: Review of Systems  Constitutional: Negative for chills, fever and malaise/fatigue.  Respiratory: Negative for cough, hemoptysis, sputum production, shortness of breath and wheezing.   Cardiovascular: Positive for palpitations. Negative for chest pain, orthopnea, leg swelling and PND.    Gastrointestinal: Positive for melena (per family, not patient). Negative for abdominal pain, blood in stool, constipation, diarrhea, nausea and vomiting.  Genitourinary: Positive for frequency (while in the ER). Negative for dysuria and hematuria.  Neurological: Positive for dizziness, loss of consciousness and weakness. Negative for tingling, tremors, sensory change, speech change, focal weakness and headaches.  All other systems reviewed and are negative.         Past Medical History:  Diagnosis Date  . Aortic atherosclerosis (HCC)    on US 05/13/13  . Aortic dissection (HCC)    Dr. Morton PetersVan Tright  . Chronic cystitis    Dr. Sherron MondayMacdiarmid  . COPD (chronic obstructive pulmonary disease) (HCC)   . Diabetes mellitus without complication (HCC)   . Hyperlipidemia   . Hypertension   . Osteoporosis    giant cell arteritis, Dr. Kellie Simmeringruslow    Past Surgical History:  Procedure Laterality Date  . aortic acrh surgery      Social History: Patient lives alone with her dog.  The patient walks unassisted.  She stil drives and mows her lawn.  [place] is [place]  She worked at [employer].  Never smoked, now retired.  No dementia, independent with all ADLs.    Allergies  Allergen Reactions  . Actonel [Risedronate Sodium] Nausea And Vomiting  . Codeine     Unknown   . Lipitor [Atorvastatin] Nausea And Vomiting and Other (See Comments)    Can only take low doses of med   .  Macrobid [Nitrofurantoin Monohyd Macro] Swelling and Other (See Comments)    Headaches  . Septra [Sulfamethoxazole-Trimethoprim] Rash    Family history: family history includes Bone cancer in her sister; Heart attack in her father.  Prior to Admission medications   Medication Sig Start Date End Date Taking? Authorizing Provider  aspirin 81 MG tablet Take 81 mg by mouth every morning.    Yes Historical Provider, MD  atorvastatin (LIPITOR) 20 MG tablet Take 20 mg by mouth every morning.    Yes Historical  Provider, MD  calcium carbonate 200 MG capsule Take 2,000 mg by mouth every morning.    Yes Historical Provider, MD  Cholecalciferol (VITAMIN D) 2000 UNITS tablet Take 2,000 Units by mouth every morning.    Yes Historical Provider, MD  furosemide (LASIX) 20 MG tablet Take 20 mg by mouth every morning.    Yes Historical Provider, MD  metFORMIN (GLUCOPHAGE) 500 MG tablet Take 500 mg by mouth 2 (two) times daily. 08/29/16  Yes Historical Provider, MD  metoprolol succinate (TOPROL-XL) 50 MG 24 hr tablet Take 50 mg by mouth daily. Take with or immediately following a meal.   Yes Historical Provider, MD  Multiple Vitamins-Minerals (MULTIVITAMIN PO) Take 1 tablet by mouth every morning.    Yes Historical Provider, MD  Omega-3 Fatty Acids (FISH OIL) 1000 MG CAPS Take 1 capsule by mouth every morning.    Yes Historical Provider, MD  [facility] 625 MG tablet Take 1,875 mg by mouth 2 (two) times daily. 08/31/16  Yes Historical Provider, MD       Physical Exam: BP (!) 129/54 (BP Location: Right Arm)   Pulse 85   Temp 98.8 F (37.1 C) (Oral)   Resp 18   Ht 5\' 4"  (1.626 m)   Wt 78.2 kg (172 lb 6.4 oz)   SpO2 97%   BMI 29.59 kg/m  General appearance: Well-developed, elderly adult female, alert and in no acute distress.   Eyes: Anicteric, conjunctiva pink, lids and lashes normal. PERRL.    ENT: No nasal deformity, discharge, epistaxis.  Hearing mildly diminished. OP tacky dry without lesions.   Neck: No neck masses.  Trachea midline.  No thyromegaly/tenderness. Lymph: No cervical or supraclavicular lymphadenopathy. Skin: Warm and dry.  No jaundice.  No suspicious rashes or lesions. Cardiac: RRR, nl S1-S2, no murmurs appreciated.  Capillary refill is brisk.  JVP normale.  No LE edema.  Radial and DP pulses 2+ and symmetric. Respiratory: Normal respiratory rate and rhythm.  CTAB without rales or wheezes. Abdomen: Abdomen soft.  No TTP. No ascites, distension, hepatosplenomegaly.   MSK: No deformities or  effusions.  No cyanosis or clubbing. Neuro: Pupils are 3 mm and reactive to 2 mm.  Extraocular movements are intact, without nystagmus.  Cranial nerve 5 is within normal limits.  Cranial nerve 7 is symmetrical.  Cranial nerve 8 is within normal limits.  Cranial nerves 9 and 10 reveal equal palate elevation.  Cranial nerve 11 reveals sternocleidomastoid strong.  Cranial nerve 12 is midline.Motor strength testing is 5/5 in the upper and lower extremities bilaterally with normal motor, tone and bulk. Sensory examination is intact to light touch. Romberg maneuver is negative for pathology.  Finger-to-nose testing is within normal limits.  The patient is oriented to time, place and person.  Speech is fluent.  Naming is grossly intact.  Recall, recent and remote, as well as general fund of knowledge seem within normal limits.  Attention span and concentration are within normal limits.  Psych: Sensorium intact and responding to questions, attention normal.  Behavior appropriate.  Affect normal.  Judgment and insight appear normal.     Labs on Admission:  I have personally reviewed following labs and imaging studies: CBC:  Recent Labs Lab 09/19/16 2155  WBC 18.2*  HGB 12.4  HCT 36.7  MCV 98.9  PLT 152   Basic Metabolic Panel:  Recent Labs Lab 09/19/16 2155  NA 128*  K 4.0  CL 96*  CO2 21*  GLUCOSE 265*  BUN 20  CREATININE 1.00  CALCIUM 8.4*   GFR: Estimated Creatinine Clearance: 37.8 mL/min (by C-G formula based on SCr of 1 mg/dL).  Liver Function Tests: No results for input(s): AST, ALT, ALKPHOS, BILITOT, PROT, ALBUMIN in the last 168 hours. No results for input(s): LIPASE, AMYLASE in the last 168 hours. No results for input(s): AMMONIA in the last 168 hours. Coagulation Profile: No results for input(s): INR, PROTIME in the last 168 hours. Cardiac Enzymes:  Recent Labs Lab 09/19/16 2155  TROPONINI 0.03*   BNP (last 3 results) No results for input(s): PROBNP in the last  8760 hours. HbA1C: No results for input(s): HGBA1C in the last 72 hours. CBG:  Recent Labs Lab 09/19/16 2251  GLUCAP 272*   Lipid Profile: No results for input(s): CHOL, HDL, LDLCALC, TRIG, CHOLHDL, LDLDIRECT in the last 72 hours. Thyroid Function Tests: No results for input(s): TSH, T4TOTAL, FREET4, T3FREE, THYROIDAB in the last 72 hours. Anemia Panel: No results for input(s): VITAMINB12, FOLATE, FERRITIN, TIBC, IRON, RETICCTPCT in the last 72 hours. Sepsis Labs: Invalid input(s): PROCALCITONIN, LACTICIDVEN No results found for this or any previous visit (from the past 240 hour(s)).       Radiological Exams on Admission: Personally reviewed CXR showed airspace opacities lower bilaterally of uncertain significance; CT head report reviewed: Dg Chest 2 View  Result Date: 09/20/2016 CLINICAL DATA:  83 y/o  F; syncope. EXAM: CHEST  2 VIEW COMPARISON:  02/23/2011 chest radiograph FINDINGS: Mild cardiomegaly. Aortic atherosclerosis with calcification or. Hazy opacification of lung bases may represent atelectasis or mild edema. Underlying pneumonia is not excluded. Post median sternotomy with wires in alignment. Multilevel degenerative changes of the spine. IMPRESSION: Mild cardiomegaly. Hazy opacities at lung bases probably represent atelectasis or mild edema. Underlying pneumonia is not excluded. Electronically Signed   By: Mitzi Hansen M.D.   On: 09/20/2016 00:04   Ct Head Wo Contrast  Result Date: 09/20/2016 CLINICAL DATA:  Syncopal episodes. EXAM: CT HEAD WITHOUT CONTRAST TECHNIQUE: Contiguous axial images were obtained from the base of the skull through the vertex without intravenous contrast. COMPARISON:  MRI from 07/28/2006 FINDINGS: Brain: Moderate central atrophy with mild to moderate chronic appearing small vessel ischemic disease of periventricular and subcortical white matter. No definite intra-axial mass. No acute intracranial hemorrhage, midline shift or edema. No  extra-axial fluid collections. Vascular: Atherosclerosis of the carotid siphons and both vertebral arteries. Skull: Negative for acute fracture or focal lesions. Sinuses/Orbits: Clear bilateral mastoids and paranasal sinuses. Intact globes. Other: None. IMPRESSION: Atrophy with mild to moderate chronic appearing small vessel ischemic disease of periventricular subcortical white matter. No acute intracranial abnormality. Electronically Signed   By: Tollie Eth M.D.   On: 09/20/2016 00:09    EKG: Independently reviewed. Rate 81, QTc 466.  PAC present.  No ST changes.      Assessment/Plan  1. Syncope:  Suspect this is multifactorial from hyponatremia, dehydration, UTI.  Troponin minimally elevated, not sure of significance. -Monitor on telemetry -Obtain  echocardiogram -Trend troponin -Obtain BNP -Gentle fluids -Treat UTI -Given family complaint of dark stools, will repeat CBC and monitor for further melena while in hospital   2. Hyponatremia:  Clinically appears hypovolemic. -Check osmolalities and urine sodium -Fluids and trend BMP  3. UTI:  Bacteriuria, urinary urgency and pyuria. -Follow urine culture -Cephalexin 500 BID for 5 days  4. Non-insulin dependent diabetes:  -Hold metformin -SSI with meals  5. Hypertension:  -Continue metoprolol -Hold furosemide for now       DVT prophylaxis: Lovenox  Code Status: DO NOT RESUSCITATE  Family Communication: Grandson at bedside  Disposition Plan: Anticipate IV fluids, treat UTI, trend electrolytes and blood counts, and monitor.  LIkely discharge within 24 hours from now. Consults called: None Admission status: OBS At the point of initial evaluation, it is my clinical opinion that admission for OBSERVATION is reasonable and necessary because the patient's presenting complaints in the context of their chronic conditions represent sufficient risk of deterioration or significant morbidity to constitute reasonable grounds for  close observation in the hospital setting, but that the patient may be medically stable for discharge from the hospital within 24 to 48 hours.    Medical decision making: Patient seen at 4:43 AM on 09/20/2016.  The patient was discussed with Dr. Nicanor Alcon.  What exists of the patient's chart was reviewed in depth and summarized above.  Clinical condition: stable.        Alberteen Sam [facility] Pager [phone]

## 2016-09-20 NOTE — Care Management Note (Signed)
Case Management Note  Patient Details  Name: [name] MRN: [id number] Date of Birth: [birth year]  Subjective/Objective:  83 y/o f admitted w/Syncope, sepsis. From home alone. PT cons-await recc.                  Action/Plan:d/c plan home.   Expected Discharge Date:                  Expected Discharge Plan:  Home w Home Health Services  In-House Referral:     Discharge planning Services  CM Consult  Post Acute Care Choice:    Choice offered to:     DME Arranged:    DME Agency:     HH Arranged:    HH Agency:     Status of Service:  In process, will continue to follow  If discussed at Long Length of Stay Meetings, dates discussed:    Additional Comments:  Lanier ClamMahabir, Tracey Hermance, RN 09/20/2016, 11:15 AM

## 2016-09-20 NOTE — Progress Notes (Signed)
CRITICAL VALUE ALERT  Critical value received: Lactic acid 2.5   Date of notification:  09/20/16  Time of notification:  1156  Critical value read back:Yes.    Nurse who received alert:  Sumner BoastIfeoma Shondell Fabel  MD notified (1st page):  Dr. Rito EhrlichKrishnan  Time of first page:  1201  MD notified (2nd page):  Time of second page:  Responding MD:  Dr Earney MalletKrishnon, orders written  Time MD responded:1300

## 2016-09-20 NOTE — Progress Notes (Signed)
PROGRESS NOTE  [name]:[id number] DOB: 1924/01/05 DOA: 09/19/2016 PCP: Lupita Raider, MD  HPI/Recap of past 21 hours: 83 year old female with past history of diabetes mellitus and hypertension presented to the emergency room on the evening of 3/26 after she had several episodes of passing out. Her baseline is in excellent health and she is quite active. She stated that she felt very tired this past weekend. Then on evening of 3/26, she was in the bathroom when she felt like her legs get so weak that they buckled out from underneath her. She tried to stand, but could not. She thinks she passed out a few times. Paramedics were called and patient was sent over to the emergency room. In emergency room, patient was found to have a very large or tender tract infection and a white count of 18.2. Troponin was also minimally elevated at 0.03. Patient brought in for further evaluation and admission.  The following day, white count only minimally improved to 15.9. Concerns were for sepsis selected acid level checked and found to be elevated at 2.5. Patient started on aggressive IV fluid resuscitation and antibiotics. BNP also checked and found to be elevated at 623.  Patient self states she saw a little bit better than previous day. Denies chest pain or trouble breathing. She states that she had no idea she had a urinary infection. She is properly mentating.  Assessment/Plan: Principal Problem:   Sepsis, unspecified organism (HCC) secondary to urinary tract infection: Patient meets criteria for sepsis on admission given leukocytosis, tachycardia, orthostatic hypotension and lactic acidosis with urinary source. Fluid resuscitating plus IV antibiotics. Blood cultures ordered, but drawn after antibiotics already started. Active Problems:   Syncope: Secondary to orthostatic hypotension brought on by sepsis.    Hyponatremia: Secondary to dehydration. Recheck in the morning    Type 1 diabetes  mellitus without complication [facility]): Check an A1c. Change sliding scale to resistant. CBGs elevated in the setting of sepsis. Normally on metformin at home. This is on hold and we should consider perhaps discontinuing it given history of diastolic CHF although at this time I think that that is not causing her lactic acidosis. If CBGs continue to stay up, we'll add Lantus    Elevated troponin: Mildly elevated, likely in the setting of dehydration, CHF. If anything, may be demand ischemia. Recheck and follow.    Essential hypertension: Holding antihypertensives until sepsis resolved.    Acute diastolic CHF (congestive heart failure) (HCC): None elevated BNP. Likely will worsen as we aggressively fluid resuscitating her. Nevertheless, until sepsis resolved, will go with IV fluids   Code Status: DO NOT RESUSCITATE   Family Communication: Daughter is at the bedside   Disposition Plan: She will likely be here for several days while sepsis resolving and then will need to diurese    Consultants:  None   Procedures:   Echocardiogram ordered/done 3/27: Results pending  Antimicrobials:  IV Rocephin 3/27-present   By mouth Keflex 3/26-3/27  DVT prophylaxis:  Lovenox   Objective: Vitals:   09/20/16 0113 09/20/16 0358 09/20/16 1051 09/20/16 1428  BP: (!) 121/57 (!) 129/54 (!) 109/51 (!) 114/48  Pulse: 73 85 74 87  Resp: 16 18 19 19   Temp:  98.8 F (37.1 C) 98.5 F (36.9 C) 98 F (36.7 C)  TempSrc:  Oral Oral Oral  SpO2: 97% 97% 95% 97%  Weight:  78.2 kg (172 lb 6.4 oz)    Height:  5\' 4"  (1.626 m)  Intake/Output Summary (Last 24 hours) at 09/20/16 1510 Last data filed at 09/20/16 1215  Gross per 24 hour  Intake          1733.33 ml  Output              801 ml  Net           932.33 ml   Filed Weights   09/20/16 0358  Weight: 78.2 kg (172 lb 6.4 oz)    Exam:   General:  Alert and oriented 3, no acute distress   Cardiovascular: Regular rate and rhythm, S1-S2    Respiratory: Clear to auscultation bilaterally   Abdomen: Soft, nontender, nondistended, hypoactive bowel sounds   Musculoskeletal: No clubbing cyanosis, trace pitting edema   Skin: No skin breaks, tears or lesions  Psychiatry: Patient is appropriate, no evidence of psychoses    Data Reviewed: CBC:  Recent Labs Lab 09/19/16 2155 09/20/16 0502  WBC 18.2* 15.9*  HGB 12.4 12.7  HCT 36.7 37.5  MCV 98.9 99.7  PLT 152 162   Basic Metabolic Panel:  Recent Labs Lab 09/19/16 2155 09/20/16 0502  NA 128* 133*  K 4.0 4.1  CL 96* 98*  CO2 21* 25  GLUCOSE 265* 192*  BUN 20 19  CREATININE 1.00 0.97  CALCIUM 8.4* 8.6*   GFR: Estimated Creatinine Clearance: 39 mL/min (by C-G formula based on SCr of 0.97 mg/dL). Liver Function Tests: No results for input(s): AST, ALT, ALKPHOS, BILITOT, PROT, ALBUMIN in the last 168 hours. No results for input(s): LIPASE, AMYLASE in the last 168 hours. No results for input(s): AMMONIA in the last 168 hours. Coagulation Profile: No results for input(s): INR, PROTIME in the last 168 hours. Cardiac Enzymes:  Recent Labs Lab 09/19/16 2155 09/20/16 0502 09/20/16 1056  TROPONINI 0.03* 0.03* 0.03*   BNP (last 3 results) No results for input(s): PROBNP in the last 8760 hours. HbA1C: No results for input(s): HGBA1C in the last 72 hours. CBG:  Recent Labs Lab 09/19/16 2251 09/20/16 0757 09/20/16 1140  GLUCAP 272* 315* 201*   Lipid Profile: No results for input(s): CHOL, HDL, LDLCALC, TRIG, CHOLHDL, LDLDIRECT in the last 72 hours. Thyroid Function Tests: No results for input(s): TSH, T4TOTAL, FREET4, T3FREE, THYROIDAB in the last 72 hours. Anemia Panel: No results for input(s): VITAMINB12, FOLATE, FERRITIN, TIBC, IRON, RETICCTPCT in the last 72 hours. Urine analysis:    Component Value Date/Time   COLORURINE AMBER (A) 09/20/2016 0017   APPEARANCEUR TURBID (A) 09/20/2016 0017   LABSPEC 1.014 09/20/2016 0017   PHURINE 5.0  09/20/2016 0017   GLUCOSEU NEGATIVE 09/20/2016 0017   HGBUR SMALL (A) 09/20/2016 0017   BILIRUBINUR NEGATIVE 09/20/2016 0017   KETONESUR 20 (A) 09/20/2016 0017   PROTEINUR 100 (A) 09/20/2016 0017   UROBILINOGEN 0.2 04/18/2009 0212   NITRITE NEGATIVE 09/20/2016 0017   LEUKOCYTESUR LARGE (A) 09/20/2016 0017   Sepsis Labs: @LABRCNTIP (procalcitonin:4,lacticidven:4)  )No results found for this or any previous visit (from the past 240 hour(s)).    Studies: Dg Chest 2 View  Result Date: 09/20/2016 CLINICAL DATA:  83 y/o  F; syncope. EXAM: CHEST  2 VIEW COMPARISON:  02/23/2011 chest radiograph FINDINGS: Mild cardiomegaly. Aortic atherosclerosis with calcification or. Hazy opacification of lung bases may represent atelectasis or mild edema. Underlying pneumonia is not excluded. Post median sternotomy with wires in alignment. Multilevel degenerative changes of the spine. IMPRESSION: Mild cardiomegaly. Hazy opacities at lung bases probably represent atelectasis or mild edema. Underlying pneumonia is not excluded.  Electronically Signed   By: Mitzi HansenLance  Furusawa-Stratton M.D.   On: 09/20/2016 00:04   Ct Head Wo Contrast  Result Date: 09/20/2016 CLINICAL DATA:  Syncopal episodes. EXAM: CT HEAD WITHOUT CONTRAST TECHNIQUE: Contiguous axial images were obtained from the base of the skull through the vertex without intravenous contrast. COMPARISON:  MRI from 07/28/2006 FINDINGS: Brain: Moderate central atrophy with mild to moderate chronic appearing small vessel ischemic disease of periventricular and subcortical white matter. No definite intra-axial mass. No acute intracranial hemorrhage, midline shift or edema. No extra-axial fluid collections. Vascular: Atherosclerosis of the carotid siphons and both vertebral arteries. Skull: Negative for acute fracture or focal lesions. Sinuses/Orbits: Clear bilateral mastoids and paranasal sinuses. Intact globes. Other: None. IMPRESSION: Atrophy with mild to moderate chronic  appearing small vessel ischemic disease of periventricular subcortical white matter. No acute intracranial abnormality. Electronically Signed   By: Tollie Ethavid  Kwon M.D.   On: 09/20/2016 00:09    Scheduled Meds: . aspirin EC  81 mg Oral Daily  . atorvastatin  20 mg Oral Daily  . [START ON 09/21/2016] cefTRIAXone (ROCEPHIN)  IV  1 g Intravenous Q24H  . enoxaparin (LOVENOX) injection  40 mg Subcutaneous Q24H  . insulin aspart  0-5 Units Subcutaneous QHS  . insulin aspart  0-9 Units Subcutaneous TID WC  . metoprolol succinate  50 mg Oral Daily  . sodium chloride flush  3 mL Intravenous Q12H    Continuous Infusions: . sodium chloride 100 mL/hr at 09/20/16 1327     LOS: 0 days     Hollice EspyKRISHNAN,Thelma Viana K, MD [facility] Pager [phone]  If 7PM-7AM, please contact night-coverage [email or web address] Password [email or web address] 09/20/2016, 3:10 PM

## 2016-09-20 NOTE — Progress Notes (Signed)
  Echocardiogram 2D Echocardiogram has been performed.  Nolon RodBrown, Tony 09/20/2016, 1:14 PM

## 2016-09-20 NOTE — Evaluation (Addendum)
Physical Therapy Evaluation Patient Details Name: [name] Colleran MRN: [id number] DOB: 04-09-1924 Today's Date: 09/20/2016   History of Present Illness  83 yo female admitted with syncope/collapse, sepsis. Hx of DM, aorta dissection s/p repair, HTN, COPD, osteoporosis.   Clinical Impression  On eval, pt required Min assist for mobility. She walked ~100 feet with a RW. Pt c/o a headache at end of session. She participated well. Noted dyspnea 2-3/4 with activity. Family present during session. Discussed d/c plan-pt and family stated pt will return home. Recommend HHPT and 24/7 supervision/assist until pt returns to baseline.     Follow Up Recommendations Home health PT;Supervision/Assistance - 24 hour (if pt is agreeable) 24/7 until pt returns to baseline    Equipment Recommendations  Rolling walker with 5" wheels    Recommendations for Other Services       Precautions / Restrictions Precautions Precautions: Fall Restrictions Weight Bearing Restrictions: No      Mobility  Bed Mobility Overal bed mobility: Needs Assistance Bed Mobility: Supine to Sit;Sit to Supine     Supine to sit: Supervision Sit to supine: Supervision   General bed mobility comments: for safety, lines  Transfers Overall transfer level: Needs assistance Equipment used: Rolling walker (2 wheeled) Transfers: Sit to/from Stand Sit to Stand: Min assist         General transfer comment: VCs safety, hand placement. Assist to rise, stabilize, control descent.   Ambulation/Gait Ambulation/Gait assistance: Min assist Ambulation Distance (Feet): 100 Feet Assistive device: Rolling walker (2 wheeled) Gait Pattern/deviations: Step-through pattern;Decreased stride length     General Gait Details: Intermittent assist to stabilize. Dyspnea 2-3/4.   Stairs            Wheelchair Mobility    Modified Rankin (Stroke Patients Only)       Balance                                              Pertinent Vitals/Pain Pain Assessment: Faces Faces Pain Scale: Hurts little more Pain Location: headache Pain Intervention(s): Monitored during session    Home Living Family/patient expects to be discharged to:: Private residence Living Arrangements: Alone   Type of Home: House Home Access: Level entry     Home Layout: One level;Laundry or work area in [employer]basement Home Equipment: Environmental consultantWalker - 2 wheels      Prior Function Level of Independence: Independent with assistive device(s)         Comments: using RW     Hand Dominance        Extremity/Trunk Assessment   Upper Extremity Assessment Upper Extremity Assessment: Generalized weakness    Lower Extremity Assessment Lower Extremity Assessment: Generalized weakness    Cervical / Trunk Assessment Cervical / Trunk Assessment: Kyphotic  Communication   Communication: No difficulties  Cognition Arousal/Alertness: Awake/alert Behavior During Therapy: WFL for tasks assessed/performed Overall Cognitive Status: Within Functional Limits for tasks assessed                                        General Comments      Exercises     Assessment/Plan    PT Assessment Patient needs continued PT services  PT Problem List Decreased strength;Decreased mobility;Decreased activity tolerance;Decreased balance;Decreased knowledge of use of DME;Pain  PT Treatment Interventions DME instruction;Gait training;Therapeutic activities;Therapeutic exercise;Patient/family education;Balance training;Functional mobility training    PT Goals (Current goals can be found in the Care Plan section)  Acute Rehab PT Goals Patient Stated Goal: home soon PT Goal Formulation: With patient/family Time For Goal Achievement: 10/04/16 Potential to Achieve Goals: Good    Frequency Min 3X/week   Barriers to discharge        Co-evaluation               End of Session Equipment Utilized During Treatment: Gait  belt Activity Tolerance: Patient tolerated treatment well Patient left: in bed;with call bell/phone within reach;with family/visitor present   PT Visit Diagnosis: Muscle weakness (generalized) (M62.81);Difficulty in walking, not elsewhere classified (R26.2)    Time: 1610-9604 PT Time Calculation (min) (ACUTE ONLY): 22 min   Charges:   PT Evaluation $PT Eval Low Complexity: 1 Procedure     PT G Codes:          [name], MPT Pager: [phone]

## 2016-09-21 DIAGNOSIS — A4151 Sepsis due to Escherichia coli [E. coli]: Principal | ICD-10-CM

## 2016-09-21 LAB — GLUCOSE, CAPILLARY
GLUCOSE-CAPILLARY: 232 mg/dL — AB (ref 65–99)
GLUCOSE-CAPILLARY: 218 mg/dL — AB (ref 65–99)
GLUCOSE-CAPILLARY: 357 mg/dL — AB (ref 65–99)
Glucose-Capillary: 223 mg/dL — ABNORMAL HIGH (ref 65–99)

## 2016-09-21 LAB — LACTIC ACID, PLASMA: Lactic Acid, Venous: 1.1 mmol/L (ref 0.5–1.9)

## 2016-09-21 LAB — BASIC METABOLIC PANEL
Anion gap: 8 (ref 5–15)
BUN: 17 mg/dL (ref 6–20)
CALCIUM: 8 mg/dL — AB (ref 8.9–10.3)
CO2: 20 mmol/L — AB (ref 22–32)
Chloride: 102 mmol/L (ref 101–111)
Creatinine, Ser: 0.9 mg/dL (ref 0.44–1.00)
GFR calc Af Amer: >60 mL/min (ref >60)
GFR calc non Af Amer: 55 mL/min — ABNORMAL LOW (ref >60)
GLUCOSE: 222 mg/dL — AB (ref 65–99)
Potassium: 3.8 mmol/L (ref 3.5–5.1)
Sodium: 130 mmol/L — ABNORMAL LOW (ref 135–145)

## 2016-09-21 LAB — CBC
HCT: 31.9 % — ABNORMAL LOW (ref 36.0–46.0)
HEMOGLOBIN: 10.9 g/dL — AB (ref 12.0–15.0)
MCH: 34 pg (ref 26.0–34.0)
MCHC: 34.2 g/dL (ref 30.0–36.0)
MCV: 99.4 fL (ref 78.0–100.0)
PLATELETS: 146 10*3/uL — AB (ref 150–400)
RBC: 3.21 MIL/uL — ABNORMAL LOW (ref 3.87–5.11)
RDW: 13 % (ref 11.5–15.5)
WBC: 15.8 10*3/uL — ABNORMAL HIGH (ref 4.0–10.5)

## 2016-09-21 LAB — TROPONIN I
TROPONIN I: 0.1 ng/mL — AB (ref <0.03)
Troponin I: 0.14 ng/mL (ref <0.03)

## 2016-09-21 MED ORDER — BISACODYL 10 MG RE SUPP: 10.0000 mg | Freq: Every day | RECTAL | Status: DC

## 2016-09-21 MED ORDER — BISACODYL 10 MG RE SUPP
10.0000 mg | Freq: Every day | RECTAL | Status: DC
  Administered 2016-09-21 – 2016-09-22 (×2): 10 mg via RECTAL
  Filled 2016-09-21 (×2): qty 1

## 2016-09-21 MED ORDER — INSULIN ASPART 100 UNIT/ML ~~LOC~~ SOLN
3.0000 [IU] | Freq: Three times a day (TID) | SUBCUTANEOUS | Status: DC
  Administered 2016-09-21 – 2016-09-23 (×6): 3 [IU] via SUBCUTANEOUS

## 2016-09-21 MED ORDER — SORBITOL 70 % SOLN
20.0000 mL | Freq: Every day | Status: DC
  Administered 2016-09-21 – 2016-09-22 (×2): 20 mL via ORAL
  Filled 2016-09-21 (×3): qty 30

## 2016-09-21 MED ORDER — INSULIN GLARGINE 100 UNIT/ML ~~LOC~~ SOLN
5.0000 [IU] | Freq: Every day | SUBCUTANEOUS | Status: DC
  Filled 2016-09-21: qty 0.05

## 2016-09-21 MED ORDER — FUROSEMIDE 20 MG PO TABS
20.0000 mg | ORAL_TABLET | Freq: Every day | ORAL | Status: DC
  Administered 2016-09-21 – 2016-09-22 (×2): 20 mg via ORAL
  Filled 2016-09-21 (×2): qty 1

## 2016-09-21 MED ORDER — DOCUSATE SODIUM 100 MG PO CAPS
100.0000 mg | ORAL_CAPSULE | Freq: Two times a day (BID) | ORAL | Status: DC
  Administered 2016-09-21: 100 mg via ORAL
  Filled 2016-09-21: qty 1

## 2016-09-21 MED ORDER — IPRATROPIUM-ALBUTEROL 0.5-2.5 (3) MG/3ML IN SOLN
3.0000 mL | Freq: Three times a day (TID) | RESPIRATORY_TRACT | Status: DC
  Administered 2016-09-21 – 2016-09-22 (×3): 3 mL via RESPIRATORY_TRACT
  Filled 2016-09-21 (×3): qty 3

## 2016-09-21 NOTE — Progress Notes (Signed)
qPhysical Therapy Treatment Patient Details Name: [name] MRN: [id number] DOB: Feb 27, 1924 Today's Date: 09/21/2016    History of Present Illness 83 yo female admitted with syncope/collapse. Hx of DM, aorta dissection s/p repair, HTN, COPD, osteoporosis.     PT Comments    Pt participated well with session. Ambulation distance limited due to pt fearful of having a bowel accident if she walked too far away from room.    Follow Up Recommendations  Home health PT;Supervision/Assistance - 24 hour     Equipment Recommendations  Rolling walker with 5" wheels    Recommendations for Other Services       Precautions / Restrictions Precautions Precautions: Fall Restrictions Weight Bearing Restrictions: No    Mobility  Bed Mobility Overal bed mobility: Needs Assistance Bed Mobility: Supine to Sit     Supine to sit: Supervision     General bed mobility comments: for safety  Transfers Overall transfer level: Needs assistance Equipment used: Rolling walker (2 wheeled) Transfers: Sit to/from Stand Sit to Stand: Min assist         General transfer comment: VCs safety, hand placement. Assist to steady.   Ambulation/Gait Ambulation/Gait assistance: Min assist Ambulation Distance (Feet): 50 Feet Assistive device: Rolling walker (2 wheeled) Gait Pattern/deviations: Step-through pattern;Decreased stride length     General Gait Details: Intermittent assist to stabilize. Dyspnea 2/4   Stairs            Wheelchair Mobility    Modified Rankin (Stroke Patients Only)       Balance                                            Cognition Arousal/Alertness: Awake/alert Behavior During Therapy: WFL for tasks assessed/performed Overall Cognitive Status: Within Functional Limits for tasks assessed                                        Exercises      General Comments        Pertinent Vitals/Pain Pain Assessment:  No/denies pain    Home Living                      Prior Function            PT Goals (current goals can now be found in the care plan section) Progress towards PT goals: Progressing toward goals    Frequency    Min 3X/week      PT Plan Current plan remains appropriate    Co-evaluation             End of Session   Activity Tolerance: Patient tolerated treatment well Patient left: in bed;with call bell/phone within reach;with family/visitor present (sitting at EOB) Nurse Communication:  (pt sitting EOB) PT Visit Diagnosis: Muscle weakness (generalized) (M62.81);Difficulty in walking, not elsewhere classified (R26.2)     Time: [id number] PT Time Calculation (min) (ACUTE ONLY): 22 min  Charges:  $Gait Training: 8-22 mins                    G Codes:          Rebeca AlertJannie Kalijah Westfall, MPT Pager: [phone]

## 2016-09-21 NOTE — Progress Notes (Addendum)
PROGRESS NOTE  [name] WJX:[id number] DOB: 04-Jun-1924 DOA: 09/19/2016 PCP: Lupita RaiderSHAW,KIMBERLEE, MD  HPI/Recap of past 4024 hours: 83 year old female with past history of diabetes mellitus and hypertension presented to the emergency room on the evening of 3/26 after she had several episodes of passing out. Her baseline is in excellent health and she is quite active. She stated that she felt very tired this past weekend. Then on evening of 3/26, she was in the bathroom when she felt like her legs get so weak that they buckled out from underneath her. She tried to stand, but could not. She thinks she passed out a few times. Paramedics were called and patient was sent over to the emergency room. In emergency room, patient was found to have a very large or tender tract infection and a white count of 18.2. Troponin was also minimally elevated at 0.03. Patient brought in for further evaluation and admission.  The following day, white count only minimally improved to 15.9. Concerns were for sepsis with elevated lactate of 2.5. Patient started on aggressive IV fluid resuscitation and antibiotics with clearance of lactic acid to 1.1. BNP also checked and found to be elevated at 623. Later that evening, the patient became short of breath and IV fluids were decreased.   Today she continues to have mild dyspnea, so IV fluids were discontinued. Her primary complaints this AM is abdominal cramping, she is constipated, hasn't had BM since 3/26 (normal for her is 2 - 3/day)  Assessment/Plan: Sepsis due to E. coli UTI: Culture sensitivity pending. Meets criteria for sepsis on admission given leukocytosis, tachycardia, orthostatic hypotension and lactic acidosis with urinary source.  - Continue ceftriaxone, pending sensitivity data.  - Monitoring blood cultures (drawn after initiation of abx).   - DC IVF given clearance of lactate, taking po, and diastolic CHF.  - Monitor leukocytosis in AM.   Acute diastolic CHF:  Elevated BNP, worsened with volume resuscitation for sepsis. G2DD on echo 3/27 with preserved EF.  - DC IVF now that sepsis physiology has resolved.  - Will reorder home lasix.   Constipation: Acute, seen on Abd XR overnight without ileus. - Bowel regimen ordered, including suppository and tap water enema if that is unsuccessful.   Syncope: Secondary to orthostatic hypotension brought on by sepsis.  Hyponatremia: Secondary to dehydration. Will recheck now that volume replete.   Type 1 diabetes mellitus without complication (HCC): Hyperglycemia with acute stress of sepsis, holding metformin.  - Check an A1c.  - Changed sliding scale to resistant, still hyperglycemic, will add mealtime insulin (3u TIDWC), also on HS correction. May require lantus based on 24 hr requirements.  - Consider discontinuing metformin with diastolic CHF, though this likely was not primary cause of lactic acidosis.   Elevated troponin: Mildly elevated, flat/downward trending, likely in the setting of dehydration, CHF. If anything, may be demand ischemia.  No regional wall motion abnormalities on echo. - No intervention at this time, will continue to monitor.   Essential hypertension: Holding antihypertensives until sepsis resolved. Has been borderline hypotensive.   Code Status: DO NOT RESUSCITATE   Family Communication: Daughter is at the bedside   Disposition Plan: Discharge pending treatment with effective abx and euvolemic.   Consultants:  None   Procedures:   Echocardiogram 3/27: - Left ventricle: The cavity size was normal. Wall thickness was   normal. Systolic function was normal. The estimated ejection   fraction was in the range of 60% to 65%. Wall motion was normal;  there were no regional wall motion abnormalities. Features are   consistent with a pseudonormal left ventricular filling pattern,   with concomitant abnormal relaxation and increased filling   pressure (grade 2 diastolic  dysfunction). - Aortic valve: There was trivial regurgitation. - Mitral valve: Calcified annulus. There was mild regurgitation. - Left atrium: The atrium was mildly dilated. - Right atrium: The atrium was mildly dilated. - Atrial septum: There was increased thickness of the septum,   consistent with lipomatous hypertrophy. - Pulmonary arteries: Systolic pressure was moderately increased.   PA peak pressure: 54 mm Hg (S).  Impressions:  - Normal LV systolic function; grade 2 diastolic dysfunction;   calcified aortic valve with trace AI; mild MR; mild biatrial   enlargement; mild TR with moderately elevated pulmonary pressure.  Antimicrobials:  IV Rocephin 3/27-present   By mouth Keflex 3/26-3/27  DVT prophylaxis:  Lovenox   Objective: Vitals:   09/21/16 0543 09/21/16 0743 09/21/16 1435 09/21/16 1444  BP: (!) 126/54   134/74  Pulse: 81 98  83  Resp: 20 18  19   Temp: 99.4 F (37.4 C)   98.1 F (36.7 C)  TempSrc: Oral   Oral  SpO2: 95% 96% 93% 96%  Weight: 80.8 kg (178 lb 2.1 oz)     Height:        Intake/Output Summary (Last 24 hours) at 09/21/16 1607 Last data filed at 09/21/16 1445  Gross per 24 hour  Intake          1759.17 ml  Output             2101 ml  Net          -341.83 ml   Filed Weights   09/20/16 0358 09/21/16 0543  Weight: 78.2 kg (172 lb 6.4 oz) 80.8 kg (178 lb 2.1 oz)    Exam:   General:  Pleasant elderly female in no distress. A&O  Cardiovascular: Regular rate and rhythm, S1-S2   Respiratory: Nonlabored, scant bibasilar crackles   Abdomen: Soft, nontender, nondistended, hypoactive bowel sounds   Musculoskeletal: No clubbing cyanosis, trace pitting edema   Skin: No skin breaks, tears or lesions  Psychiatry: Patient is appropriate, no evidence of psychoses    Data Reviewed: CBC:  Recent Labs Lab 09/19/16 2155 09/20/16 0502 09/21/16 0444  WBC 18.2* 15.9* 15.8*  HGB 12.4 12.7 10.9*  HCT 36.7 37.5 31.9*  MCV 98.9 99.7 99.4    PLT 152 162 146*   Basic Metabolic Panel:  Recent Labs Lab 09/19/16 2155 09/20/16 0502 09/21/16 0444  NA 128* 133* 130*  K 4.0 4.1 3.8  CL 96* 98* 102  CO2 21* 25 20*  GLUCOSE 265* 192* 222*  BUN 20 19 17   CREATININE 1.00 0.97 0.90  CALCIUM 8.4* 8.6* 8.0*   GFR: Estimated Creatinine Clearance: 42.7 mL/min (by C-G formula based on SCr of 0.9 mg/dL). Liver Function Tests: No results for input(s): AST, ALT, ALKPHOS, BILITOT, PROT, ALBUMIN in the last 168 hours. No results for input(s): LIPASE, AMYLASE in the last 168 hours. No results for input(s): AMMONIA in the last 168 hours. Coagulation Profile: No results for input(s): INR, PROTIME in the last 168 hours. Cardiac Enzymes:  Recent Labs Lab 09/19/16 2155 09/20/16 0502 09/20/16 1056 09/21/16 0444 09/21/16 1155  TROPONINI 0.03* 0.03* 0.03* 0.14* 0.10*   BNP (last 3 results) No results for input(s): PROBNP in the last 8760 hours. HbA1C: No results for input(s): HGBA1C in the last 72 hours. CBG:  Recent Labs  Lab 09/20/16 1140 09/20/16 1624 09/20/16 2046 09/21/16 0757 09/21/16 1120  GLUCAP 201* 232* 256* 218* 357*   Lipid Profile: No results for input(s): CHOL, HDL, LDLCALC, TRIG, CHOLHDL, LDLDIRECT in the last 72 hours. Thyroid Function Tests: No results for input(s): TSH, T4TOTAL, FREET4, T3FREE, THYROIDAB in the last 72 hours. Anemia Panel: No results for input(s): VITAMINB12, FOLATE, FERRITIN, TIBC, IRON, RETICCTPCT in the last 72 hours. Urine analysis:    Component Value Date/Time   COLORURINE AMBER (A) 09/20/2016 0017   APPEARANCEUR TURBID (A) 09/20/2016 0017   LABSPEC 1.014 09/20/2016 0017   PHURINE 5.0 09/20/2016 0017   GLUCOSEU NEGATIVE 09/20/2016 0017   HGBUR SMALL (A) 09/20/2016 0017   BILIRUBINUR NEGATIVE 09/20/2016 0017   KETONESUR 20 (A) 09/20/2016 0017   PROTEINUR 100 (A) 09/20/2016 0017   UROBILINOGEN 0.2 04/18/2009 0212   NITRITE NEGATIVE 09/20/2016 0017   LEUKOCYTESUR LARGE (A)  09/20/2016 0017   Sepsis Labs: Recent Results (from the past 240 hour(s))  Urine culture     Status: Abnormal (Preliminary result)   Collection Time: 09/20/16 12:17 AM  Result Value Ref Range Status   Specimen Description URINE, RANDOM  Final   Special Requests NONE  Final   Culture (A)  Final    >=100,000 COLONIES/mL ESCHERICHIA COLI SUSCEPTIBILITIES TO FOLLOW Performed at [facility], [address], [place], [place] [address]    Report Status PENDING  Incomplete  Culture, blood (Routine X 2) w Reflex to ID Panel     Status: None (Preliminary result)   Collection Time: 09/20/16  3:37 PM  Result Value Ref Range Status   Specimen Description LEFT ANTECUBITAL  Final   Special Requests BOTTLES DRAWN AEROBIC AND ANAEROBIC 5CC  Final   Culture   Final    NO GROWTH < 24 HOURS Performed at [facility], [address] [place], [place] [address]    Report Status PENDING  Incomplete  Culture, blood (Routine X 2) w Reflex to ID Panel     Status: None (Preliminary result)   Collection Time: 09/20/16  3:43 PM  Result Value Ref Range Status   Specimen Description RIGHT ANTECUBITAL  Final   Special Requests IN PEDIATRIC BOTTLE 3.5CC  Final   Culture   Final    NO GROWTH < 24 HOURS Performed at [facility], [address] [place], [place] [address]    Report Status PENDING  Incomplete      Studies: Dg Abd Portable 1v  Result Date: 09/20/2016 CLINICAL DATA:  Acute onset of generalized abdominal distention. Initial encounter. EXAM: PORTABLE ABDOMEN - 1 VIEW COMPARISON:  CTA of the abdomen and pelvis performed 10/28/2009 FINDINGS: The visualized bowel gas pattern is unremarkable. Scattered air and stool filled loops of colon are seen; no abnormal dilatation of small bowel loops is seen to suggest small bowel obstruction. No free intra-abdominal air is identified, though evaluation for free air is limited on a single supine view. Degenerative change is noted along the  lower lumbar spine; the sacroiliac joints are unremarkable in appearance. The visualized lung bases are essentially clear. IMPRESSION: Unremarkable bowel gas pattern; no free intra-abdominal air seen. Moderate amount of stool noted in the colon. Electronically Signed   By: Roanna Raider M.D.   On: 09/20/2016 23:20    Scheduled Meds: . aspirin EC  81 mg Oral Daily  . bisacodyl  10 mg Rectal Daily  . cefTRIAXone (ROCEPHIN)  IV  1 g Intravenous Q24H  .  enoxaparin (LOVENOX) injection  40 mg Subcutaneous Q24H  . furosemide  20 mg Oral Daily  . insulin aspart  0-20 Units Subcutaneous TID WC  . insulin aspart  0-5 Units Subcutaneous QHS  . ipratropium-albuterol  3 mL Nebulization TID  . sodium chloride flush  3 mL Intravenous Q12H  . sorbitol  20 mL Oral Daily   Continuous Infusions:   LOS: 1 day   Hazeline Junker, MD [facility] Pager [phone]   If 7PM-7AM, please contact night-coverage [email or web address] Password [facility] 09/21/2016, 4:07 PM

## 2016-09-22 DIAGNOSIS — I5033 Acute on chronic diastolic (congestive) heart failure: Secondary | ICD-10-CM

## 2016-09-22 LAB — URINE CULTURE

## 2016-09-22 LAB — BASIC METABOLIC PANEL
Anion gap: 8 (ref 5–15)
BUN: 12 mg/dL (ref 6–20)
CHLORIDE: 102 mmol/L (ref 101–111)
CO2: 24 mmol/L (ref 22–32)
CREATININE: 0.69 mg/dL (ref 0.44–1.00)
Calcium: 8.3 mg/dL — ABNORMAL LOW (ref 8.9–10.3)
GFR calc Af Amer: >60 mL/min (ref >60)
GFR calc non Af Amer: >60 mL/min (ref >60)
GLUCOSE: 208 mg/dL — AB (ref 65–99)
POTASSIUM: 3.6 mmol/L (ref 3.5–5.1)
SODIUM: 134 mmol/L — AB (ref 135–145)

## 2016-09-22 LAB — CBC
HCT: 32.9 % — ABNORMAL LOW (ref 36.0–46.0)
HEMOGLOBIN: 11.1 g/dL — AB (ref 12.0–15.0)
MCH: 32.6 pg (ref 26.0–34.0)
MCHC: 33.7 g/dL (ref 30.0–36.0)
MCV: 96.8 fL (ref 78.0–100.0)
Platelets: 165 10*3/uL (ref 150–400)
RBC: 3.4 MIL/uL — AB (ref 3.87–5.11)
RDW: 13 % (ref 11.5–15.5)
WBC: 13.1 10*3/uL — ABNORMAL HIGH (ref 4.0–10.5)

## 2016-09-22 LAB — GLUCOSE, CAPILLARY
GLUCOSE-CAPILLARY: 219 mg/dL — AB (ref 65–99)
GLUCOSE-CAPILLARY: 240 mg/dL — AB (ref 65–99)
Glucose-Capillary: 220 mg/dL — ABNORMAL HIGH (ref 65–99)
Glucose-Capillary: 303 mg/dL — ABNORMAL HIGH (ref 65–99)
Glucose-Capillary: 151 mg/dL — ABNORMAL HIGH (ref 65–99)

## 2016-09-22 MED ORDER — METOPROLOL SUCCINATE ER 50 MG PO TB24
50.0000 mg | ORAL_TABLET | Freq: Every day | ORAL | Status: DC
  Administered 2016-09-22 – 2016-09-23 (×2): 50 mg via ORAL
  Filled 2016-09-22 (×2): qty 1

## 2016-09-22 MED ORDER — GUAIFENESIN-DM 100-10 MG/5ML PO SYRP
5.0000 mL | ORAL_SOLUTION | ORAL | Status: DC | PRN
  Administered 2016-09-22 – 2016-09-23 (×2): 5 mL via ORAL
  Filled 2016-09-22 (×2): qty 10

## 2016-09-22 MED ORDER — FUROSEMIDE 10 MG/ML IJ SOLN
40.0000 mg | Freq: Two times a day (BID) | INTRAMUSCULAR | Status: DC
  Administered 2016-09-22 – 2016-09-23 (×2): 40 mg via INTRAVENOUS
  Filled 2016-09-22 (×2): qty 4

## 2016-09-22 MED ORDER — SALINE SPRAY 0.65 % NA SOLN
1.0000 | NASAL | Status: DC | PRN
  Filled 2016-09-22: qty 44

## 2016-09-22 MED ORDER — IPRATROPIUM-ALBUTEROL 0.5-2.5 (3) MG/3ML IN SOLN
3.0000 mL | Freq: Two times a day (BID) | RESPIRATORY_TRACT | Status: DC
  Administered 2016-09-22 – 2016-09-23 (×2): 3 mL via RESPIRATORY_TRACT
  Filled 2016-09-22 (×2): qty 3

## 2016-09-22 MED ORDER — FUROSEMIDE 10 MG/ML IJ SOLN
20.0000 mg | Freq: Once | INTRAMUSCULAR | Status: AC
  Administered 2016-09-22: 20 mg via INTRAVENOUS
  Filled 2016-09-22: qty 2

## 2016-09-22 MED ORDER — INSULIN DETEMIR 100 UNIT/ML ~~LOC~~ SOLN
8.0000 [IU] | Freq: Every day | SUBCUTANEOUS | Status: DC
  Administered 2016-09-22 – 2016-09-23 (×2): 8 [IU] via SUBCUTANEOUS
  Filled 2016-09-22 (×2): qty 0.08

## 2016-09-22 MED ORDER — POLYVINYL ALCOHOL 1.4 % OP SOLN
2.0000 [drp] | OPHTHALMIC | Status: DC | PRN
  Filled 2016-09-22: qty 15

## 2016-09-22 MED ORDER — LIP MEDEX EX OINT
1.0000 "application " | TOPICAL_OINTMENT | CUTANEOUS | Status: DC | PRN
  Filled 2016-09-22: qty 7

## 2016-09-22 MED ORDER — POTASSIUM CHLORIDE 20 MEQ PO PACK: 40.0000 meq | PACK | Freq: Once | ORAL | Status: DC

## 2016-09-22 MED ORDER — PHENOL 1.4 % MT LIQD
1.0000 | OROMUCOSAL | Status: DC | PRN
  Filled 2016-09-22: qty 177

## 2016-09-22 MED ORDER — POTASSIUM CHLORIDE CRYS ER 20 MEQ PO TBCR
40.0000 meq | EXTENDED_RELEASE_TABLET | Freq: Once | ORAL | Status: AC
  Administered 2016-09-22: 40 meq via ORAL
  Filled 2016-09-22: qty 2

## 2016-09-22 NOTE — Care Management Note (Signed)
Case Management Note  Patient Details  Name: [name] MRN: [id number] Date of Birth: Dec 23, 1923  Subjective/Objective: Provided patient w/HHC agency list-chose [facility]-rep Kim aware of HHPT order, & d/c in am if stable. Patient already has rw. No further CM needs.                  Action/Plan:d/c home w/HHC.   Expected Discharge Date:                  Expected Discharge Plan:  Home w Home Health Services  In-House Referral:     Discharge planning Services  CM Consult  Post Acute Care Choice:  Durable Medical Equipment (Has rw.) Choice offered to:  Patient  DME Arranged:    DME Agency:     HH Arranged:  PT HH Agency:     Status of Service:  Completed, signed off  If discussed at Long Length of Stay Meetings, dates discussed:    Additional Comments:  Lanier ClamMahabir, Parmvir Boomer, RN 09/22/2016, 1:08 PM

## 2016-09-22 NOTE — Progress Notes (Signed)
Inpatient Diabetes Program Recommendations  AACE/ADA: New Consensus Statement on Inpatient Glycemic Control (2015)  Target Ranges:  Prepandial:   less than 140 mg/dL      Peak postprandial:   less than 180 mg/dL (1-2 hours)      Critically ill patients:  140 - 180 mg/dL   Results for [name], [name] (MRN [id number]) as of 09/22/2016 11:05  Ref. Range 09/21/2016 07:57 09/21/2016 11:20 09/21/2016 16:26 09/21/2016 22:01 09/22/2016 07:56  Glucose-Capillary Latest Ref Range: 65 - 99 mg/dL 147218 (H) 829357 (H) 562223 (H) 219 (H) 220 (H)    Review of Glycemic Control  Current orders for Inpatient glycemic control: Novolog 0-20 units TID with meals, Novolog 0-5 units QHS, Novolog 3 units TID with meals for meal coverage  Inpatient Diabetes Program Recommendations: Insulin - Basal: Glucose ranged from 217-357 mg/dl on 1/30/863/28/18 and fasting glucose is 220 mg/dl this morning. Please consider ordering Lantus 8 units Q24H (based on 80 kg x 0.1 units). Insulin - Meal Coverage: Please consider increasing meal coverage to Novolog 5 units TID with meals. HgbA1C: A1C in process.  Thanks, Orlando PennerMarie Mikya Don, RN, MSN, CDE Diabetes Coordinator Inpatient Diabetes Program [phone] (Team Pager from 8am to 5pm)

## 2016-09-22 NOTE — Progress Notes (Signed)
qPhysical Therapy Treatment Patient Details Name: [name] MRN: [id number] DOB: [birth year] Today's Date: 09/22/2016    History of Present Illness 83 yo female admitted with syncope/collapse. Hx of DM, aorta dissection s/p repair, HTN, COPD, osteoporosis.     PT Comments    Progressing with mobility.    Follow Up Recommendations  Home health PT (if pt is agreeable);Supervision/Assistance - 24 hour     Equipment Recommendations  Rolling walker with 5" wheels    Recommendations for Other Services       Precautions / Restrictions Precautions Precautions: Fall Restrictions Weight Bearing Restrictions: No    Mobility  Bed Mobility               General bed mobility comments: pt sitting EOB  Transfers Overall transfer level: Needs assistance Equipment used: Rolling walker (2 wheeled) Transfers: Sit to/from Stand Sit to Stand: Min guard         General transfer comment: close guard for safety. VCs hand placement  Ambulation/Gait Ambulation/Gait assistance: Min guard Ambulation Distance (Feet): 85 Feet (85'x1, 75'x1) Assistive device: Rolling walker (2 wheeled) Gait Pattern/deviations: Step-through pattern     General Gait Details: Intermittent assist to stabilize. Dyspnea 2/4. O2 sats 98% on RA   Stairs            Wheelchair Mobility    Modified Rankin (Stroke Patients Only)       Balance                                            Cognition Arousal/Alertness: Awake/alert Behavior During Therapy: WFL for tasks assessed/performed Overall Cognitive Status: Within Functional Limits for tasks assessed                                        Exercises      General Comments        Pertinent Vitals/Pain Pain Assessment: No/denies pain    Home Living                      Prior Function            PT Goals (current goals can now be found in the care plan section) Progress towards PT  goals: Progressing toward goals    Frequency    Min 3X/week      PT Plan Current plan remains appropriate    Co-evaluation             End of Session   Activity Tolerance: Patient tolerated treatment well Patient left: in bed;with call bell/phone within reach;with family/visitor present (sitting EOB)   PT Visit Diagnosis: Muscle weakness (generalized) (M62.81);Difficulty in walking, not elsewhere classified (R26.2)     Time: [id number] PT Time Calculation (min) (ACUTE ONLY): 18 min  Charges:  $Gait Training: 8-22 mins                    G Codes:          Rebeca AlertJannie Brandom Kerwin, MPT Pager: [phone]

## 2016-09-22 NOTE — Progress Notes (Signed)
PROGRESS NOTE    [name]  ZOX:[id number] DOB: 1924/05/18 DOA: 09/19/2016 PCP: Lupita Raider, MD    Brief Narrative:  83 yo female admitted with the chief complain of syncope. Patient developed sudden weakness with orthostatic symptoms. On the initial physical examination, blood pressure stable. Oral mucosa dry.  Non focal on neuro exam. Na down to 128 with cr at 1.0. Urine analysis positive for infection. Started on IV antibiotics. Developed signs of volume overload.    Assessment & Plan:   Principal Problem:   Sepsis, unspecified organism [facility]) Active Problems:   Syncope   Hyponatremia   Dehydration   Type 2 diabetes mellitus with complication, without long-term current use of insulin (HCC)   Elevated troponin   Essential hypertension   Acute diastolic CHF (congestive heart failure) (HCC)   Acute lower UTI   1. Sepsis due to e. Coli urine infection (present on admission). Will continue antibiotic therapy with ceftriaxone, will hold on IV fluids, follow a restrictive IV fluids strategy.   2. Volume overload with acute decompensation of chronic diastolic heart failure. Will hold on IV fluids and will continue with diuresis, patient with dyspnea and wheezing. Follow urine output.   3. Hyponatremia. Na at 134, will follow renal function in am, continue diuresis with loop diuretic.   4. T2DM. Continue glucose cover and monitoring with iss, add basal insulin regimen, patient tolerating po well. Capillary glucose 219--220-303. Hold metformin for now.   5. Hypertension. Systolic blood pressure 130 to 150, will resume metoprolol and will continue IV furosemide.     DVT prophylaxis: enoxaparin Code Status: DNR Family Communication: I spoke with patient's daughter at the bedside and all questions were addressed.  Disposition Plan: Home with home health.   Consultants:      Procedures:      Antimicrobials:   Ceftriaxone     Subjective: Positive for dyspnea and  wheezing, no chest pain, no nausea or vomiting.   Objective: Vitals:   09/22/16 0003 09/22/16 0529 09/22/16 0916 09/22/16 0919  BP: 135/66 (!) 153/70    Pulse: 88 95    Resp: 18 17    Temp: 98.5 F (36.9 C) 98.7 F (37.1 C)    TempSrc: Oral Oral    SpO2: 94% 95% 95% 95%  Weight:  80.5 kg (177 lb 7.5 oz)    Height:        Intake/Output Summary (Last 24 hours) at 09/22/16 1430 Last data filed at 09/22/16 1321  Gross per 24 hour  Intake              370 ml  Output             2452 ml  Net            -2082 ml   Filed Weights   09/20/16 0358 09/21/16 0543 09/22/16 0529  Weight: 78.2 kg (172 lb 6.4 oz) 80.8 kg (178 lb 2.1 oz) 80.5 kg (177 lb 7.5 oz)    Examination:  General exam: deconditioned  E ENT: mild pallor, oral mucosa moist. No icterus.  Respiratory system: Positive expiratory wheezing, rales or rhonchi. Respiratory effort normal. Cardiovascular system: S1 & S2 heard, RRR. Positive JVD, murmurs, rubs, gallops or clicks. ++ pitting pedal edema. Gastrointestinal system: Abdomen is nondistended, soft and nontender. No organomegaly or masses felt. Normal bowel sounds heard. Central nervous system: Alert and oriented. No focal neurological deficits. Extremities: Symmetric 5 x 5 power. Skin: No rashes, lesions or ulcers .  Data Reviewed: I have personally reviewed following labs and imaging studies  CBC:  Recent Labs Lab 09/19/16 2155 09/20/16 0502 09/21/16 0444 09/22/16 0447  WBC 18.2* 15.9* 15.8* 13.1*  HGB 12.4 12.7 10.9* 11.1*  HCT 36.7 37.5 31.9* 32.9*  MCV 98.9 99.7 99.4 96.8  PLT 152 162 146* 165   Basic Metabolic Panel:  Recent Labs Lab 09/19/16 2155 09/20/16 0502 09/21/16 0444 09/22/16 0447  NA 128* 133* 130* 134*  K 4.0 4.1 3.8 3.6  CL 96* 98* 102 102  CO2 21* 25 20* 24  GLUCOSE 265* 192* 222* 208*  BUN 20 19 17 12   CREATININE 1.00 0.97 0.90 0.69  CALCIUM 8.4* 8.6* 8.0* 8.3*   GFR: Estimated Creatinine Clearance: 48 mL/min (by C-G  formula based on SCr of 0.69 mg/dL). Liver Function Tests: No results for input(s): AST, ALT, ALKPHOS, BILITOT, PROT, ALBUMIN in the last 168 hours. No results for input(s): LIPASE, AMYLASE in the last 168 hours. No results for input(s): AMMONIA in the last 168 hours. Coagulation Profile: No results for input(s): INR, PROTIME in the last 168 hours. Cardiac Enzymes:  Recent Labs Lab 09/19/16 2155 09/20/16 0502 09/20/16 1056 09/21/16 0444 09/21/16 1155  TROPONINI 0.03* 0.03* 0.03* 0.14* 0.10*   BNP (last 3 results) No results for input(s): PROBNP in the last 8760 hours. HbA1C: No results for input(s): HGBA1C in the last 72 hours. CBG:  Recent Labs Lab 09/21/16 1120 09/21/16 1626 09/21/16 2201 09/22/16 0756 09/22/16 1322  GLUCAP 357* 223* 219* 220* 303*   Lipid Profile: No results for input(s): CHOL, HDL, LDLCALC, TRIG, CHOLHDL, LDLDIRECT in the last 72 hours. Thyroid Function Tests: No results for input(s): TSH, T4TOTAL, FREET4, T3FREE, THYROIDAB in the last 72 hours. Anemia Panel: No results for input(s): VITAMINB12, FOLATE, FERRITIN, TIBC, IRON, RETICCTPCT in the last 72 hours. Sepsis Labs:  Recent Labs Lab 09/20/16 1056 09/20/16 2009 09/21/16 0444  LATICACIDVEN 2.5* 1.7 1.1    Recent Results (from the past 240 hour(s))  Urine culture     Status: Abnormal   Collection Time: 09/20/16 12:17 AM  Result Value Ref Range Status   Specimen Description URINE, RANDOM  Final   Special Requests NONE  Final   Culture >=100,000 COLONIES/mL ESCHERICHIA COLI (A)  Final   Report Status 09/22/2016 FINAL  Final   Organism ID, Bacteria ESCHERICHIA COLI (A)  Final      Susceptibility   Escherichia coli - MIC*    AMPICILLIN >=32 RESISTANT Resistant     CEFAZOLIN <=4 SENSITIVE Sensitive     CEFTRIAXONE <=1 SENSITIVE Sensitive     CIPROFLOXACIN <=0.25 SENSITIVE Sensitive     GENTAMICIN <=1 SENSITIVE Sensitive     IMIPENEM <=0.25 SENSITIVE Sensitive     NITROFURANTOIN <=16  SENSITIVE Sensitive     TRIMETH/SULFA <=20 SENSITIVE Sensitive     AMPICILLIN/SULBACTAM >=32 RESISTANT Resistant     PIP/TAZO <=4 SENSITIVE Sensitive     Extended ESBL NEGATIVE Sensitive     * >=100,000 COLONIES/mL ESCHERICHIA COLI  Culture, blood (Routine X 2) w Reflex to ID Panel     Status: None (Preliminary result)   Collection Time: 09/20/16  3:37 PM  Result Value Ref Range Status   Specimen Description LEFT ANTECUBITAL  Final   Special Requests BOTTLES DRAWN AEROBIC AND ANAEROBIC 5CC  Final   Culture   Final    NO GROWTH < 24 HOURS Performed at [facility], [address] [place], [place] [address]    Report  Status PENDING  Incomplete  Culture, blood (Routine X 2) w Reflex to ID Panel     Status: None (Preliminary result)   Collection Time: 09/20/16  3:43 PM  Result Value Ref Range Status   Specimen Description RIGHT ANTECUBITAL  Final   Special Requests IN PEDIATRIC BOTTLE 3.5CC  Final   Culture   Final    NO GROWTH < 24 HOURS Performed at [facility], [address] [place], [place] [id number]    Report Status PENDING  Incomplete         Radiology Studies: Dg Abd Portable 1v  Result Date: 09/20/2016 CLINICAL DATA:  Acute onset of generalized abdominal distention. Initial encounter. EXAM: PORTABLE ABDOMEN - 1 VIEW COMPARISON:  CTA of the abdomen and pelvis performed 10/28/2009 FINDINGS: The visualized bowel gas pattern is unremarkable. Scattered air and stool filled loops of colon are seen; no abnormal dilatation of small bowel loops is seen to suggest small bowel obstruction. No free intra-abdominal air is identified, though evaluation for free air is limited on a single supine view. Degenerative change is noted along the lower lumbar spine; the sacroiliac joints are unremarkable in appearance. The visualized lung bases are essentially clear. IMPRESSION: Unremarkable bowel gas pattern; no free intra-abdominal air seen. Moderate amount of stool noted in the  colon. Electronically Signed   By: Roanna RaiderJeffery  Chang M.D.   On: 09/20/2016 23:20        Scheduled Meds: . aspirin EC  81 mg Oral Daily  . bisacodyl  10 mg Rectal Daily  . cefTRIAXone (ROCEPHIN)  IV  1 g Intravenous Q24H  . enoxaparin (LOVENOX) injection  40 mg Subcutaneous Q24H  . furosemide  20 mg Oral Daily  . insulin aspart  0-20 Units Subcutaneous TID WC  . insulin aspart  0-5 Units Subcutaneous QHS  . insulin aspart  3 Units Subcutaneous TID WC  . ipratropium-albuterol  3 mL Nebulization BID  . sodium chloride flush  3 mL Intravenous Q12H  . sorbitol  20 mL Oral Daily   Continuous Infusions:   LOS: 2 days       Zyriah Mask Annett Gulaaniel Nana Hoselton, MD [facility] Pager [phone]  If 7PM-7AM, please contact night-coverage [email or web address] Password [email or web address] 09/22/2016, 2:30 PM

## 2016-09-23 DIAGNOSIS — E118 Type 2 diabetes mellitus with unspecified complications: Secondary | ICD-10-CM

## 2016-09-23 LAB — BASIC METABOLIC PANEL
ANION GAP: 10 (ref 5–15)
BUN: 16 mg/dL (ref 6–20)
CALCIUM: 8.4 mg/dL — AB (ref 8.9–10.3)
CO2: 26 mmol/L (ref 22–32)
Chloride: 97 mmol/L — ABNORMAL LOW (ref 101–111)
Creatinine, Ser: 0.72 mg/dL (ref 0.44–1.00)
GFR calc Af Amer: >60 mL/min (ref >60)
Glucose, Bld: 175 mg/dL — ABNORMAL HIGH (ref 65–99)
POTASSIUM: 3.2 mmol/L — AB (ref 3.5–5.1)
Sodium: 133 mmol/L — ABNORMAL LOW (ref 135–145)

## 2016-09-23 LAB — CBC WITH DIFFERENTIAL/PLATELET
BASOS ABS: 0.1 10*3/uL (ref 0.0–0.1)
Basophils Relative: 1 %
EOS ABS: 0.4 10*3/uL (ref 0.0–0.7)
Eosinophils Relative: 3 %
HCT: 33.2 % — ABNORMAL LOW (ref 36.0–46.0)
HEMOGLOBIN: 11.6 g/dL — AB (ref 12.0–15.0)
LYMPHS PCT: 18 %
Lymphs Abs: 2.2 10*3/uL (ref 0.7–4.0)
MCH: 34.1 pg — ABNORMAL HIGH (ref 26.0–34.0)
MCHC: 34.9 g/dL (ref 30.0–36.0)
MCV: 97.6 fL (ref 78.0–100.0)
MONOS PCT: 11 %
Monocytes Absolute: 1.3 10*3/uL — ABNORMAL HIGH (ref 0.1–1.0)
Neutro Abs: 8.2 10*3/uL — ABNORMAL HIGH (ref 1.7–7.7)
Neutrophils Relative %: 67 %
PLATELETS: 191 10*3/uL (ref 150–400)
RBC: 3.4 MIL/uL — AB (ref 3.87–5.11)
RDW: 13 % (ref 11.5–15.5)
WBC: 12.2 10*3/uL — ABNORMAL HIGH (ref 4.0–10.5)

## 2016-09-23 LAB — HEMOGLOBIN A1C
Hgb A1c MFr Bld: 7.7 % — ABNORMAL HIGH (ref 4.8–5.6)
Mean Plasma Glucose: 174 mg/dL

## 2016-09-23 LAB — GLUCOSE, CAPILLARY
GLUCOSE-CAPILLARY: 184 mg/dL — AB (ref 65–99)
GLUCOSE-CAPILLARY: 194 mg/dL — AB (ref 65–99)

## 2016-09-23 MED ORDER — POTASSIUM CHLORIDE CRYS ER 20 MEQ PO TBCR
40.0000 meq | EXTENDED_RELEASE_TABLET | ORAL | Status: AC
  Administered 2016-09-23 (×2): 40 meq via ORAL
  Filled 2016-09-23 (×2): qty 2

## 2016-09-23 MED ORDER — CEPHALEXIN 500 MG PO CAPS
500.0000 mg | ORAL_CAPSULE | Freq: Two times a day (BID) | ORAL | 0 refills | Status: AC
Start: 2016-09-23 — End: 2016-09-30

## 2016-09-23 MED ORDER — POTASSIUM CHLORIDE ER 10 MEQ PO TBCR: 10.0000 meq | EXTENDED_RELEASE_TABLET | Freq: Every day | ORAL | 0 refills | Status: AC

## 2016-09-23 NOTE — Progress Notes (Signed)
Patient had a short burst of SVT. Patient was asleep. Telemetry strip is saved.

## 2016-09-23 NOTE — Progress Notes (Signed)
Wants to eat supper prior to D/C

## 2016-09-23 NOTE — Discharge Summary (Signed)
Physician Discharge Summary  FRANCELIA MCLAR[facility] ZOX:[id number] DOB: 29-Sep-1923 DOA: 09/19/2016  PCP: Lupita Raider, MD  Admit date: 09/19/2016 Discharge date: 09/23/2016  Admitted From: Home  Disposition:  Home   Recommendations for Outpatient Follow-up:  1. Follow up with PCP in 1- week 2. Continue antibiotic therapy with cephalexin for 7 more days.   Home Health: Yes  Equipment/Devices: Na  Discharge Condition: Stable   CODE STATUS: DNR Diet recommendation: Heart Healthy / Carb Modified   Brief/Interim Summary: This is a 83 year old female who presented to hospital with a chief complaint of syncope. Apparently she had a sudden episode of weakness associated with generalized malaise. Positive syncope episode, in the background of a orthostatic event ( after standing up and walking towards commode).  On initial physical examination blood pressure 07/25/1950, heart 85, temperature 98.8, respiratory 18, oxygen saturation 97%. Oral mucosa dry, lungs were clear to auscultation bilaterally, heart S1-S2 present and rhythmic, abdomen was soft and nontender, neurologically patient was nonfocal. Sodium 128, potassium 4.0, chloride 96, bicarbonate 21, glucose 265, BUN 10, creatinine 1.0, calcium 8.4, white count 18.2, hemoglobin 12.4, hematocrit 36.7, platelets 157. Urinalysis had too numerous to count white cells, negative nitrates. CT head with atrophy, no acute intracranial abnormalities. EKG was normal sinus rhythm, premature atrial complexes. Chest x-ray had mild vascular congestion.  Patient was admitted to the hospital working diagnosis of syncope probably multifactorial associate with dehydration, hyponatremia and urinary tract infection, complicated with sepsis.  1. Sepsis due to Escherichia coli urinary tract infection (present on admission). Patient was admitted to hospital, placed on the remote telemetry monitor, IV antibody therapy with ceftriaxone. Urine culture positive for Escherichia coli  resistant to ampicillin and sulbactam. Sensitive to cephalosporins and fluoroquinolones. Patient will be discharged on cephalexin for 7 more days.  2. Volume overload pulmonary edema, diastolic heart failure decompensation (acute on chronic). Patient received intravenous fluids as part of the sepsis management, patient developed pulmonary edema, acquired aggressive diuresis with furosemide. Negative fluid balance was achieved, negative 4,08.5 ml. Echocardiography with ejection fraction between 60 and 65% on the left ventricle.  3. Hyponatremia. Likely related to sepsis, discharge sodium 133. Kidney function normal with a creatinine of 0.72, serum bicarbonate 26. Patient will be discharged on potassium supplements to avoid hypokalemia.  4. Type 2 diabetes mellitus. Patient was placed on insulin sliding scale as well as basal insulin regimen, capillary glucose 240, 184, 194. Will resume metformin by the time of discharge.  5. Hypertension. Patient will resume metoprolol and furosemide by the time of discharge.  Discharge Diagnoses:  Principal Problem:   Sepsis, unspecified organism [facility]) Active Problems:   Syncope   Hyponatremia   Dehydration   Type 2 diabetes mellitus with complication, without long-term current use of insulin (HCC)   Elevated troponin   Essential hypertension   Acute diastolic CHF (congestive heart failure) (HCC)   Acute lower UTI    Discharge Instructions   Allergies as of 09/23/2016      Reactions   Actonel [risedronate Sodium] Nausea And Vomiting   Codeine    Unknown    Lipitor [atorvastatin] Nausea And Vomiting, Other (See Comments)   Can only take low doses of med    Macrobid [nitrofurantoin Monohyd Macro] Swelling, Other (See Comments)   Headaches   Septra [sulfamethoxazole-trimethoprim] Rash      Medication List    TAKE these medications   aspirin 81 MG tablet Take 81 mg by mouth every morning.   atorvastatin 20 MG tablet Commonly known  as:   LIPITOR Take 20 mg by mouth every morning.   calcium carbonate 200 MG capsule Take 2,000 mg by mouth every morning.   cephALEXin 500 MG capsule Commonly known as:  KEFLEX Take 1 capsule (500 mg total) by mouth 2 (two) times daily.   Fish Oil 1000 MG Caps Take 1 capsule by mouth every morning.   furosemide 20 MG tablet Commonly known as:  LASIX Take 20 mg by mouth every morning.   metFORMIN 500 MG tablet Commonly known as:  GLUCOPHAGE Take 500 mg by mouth 2 (two) times daily.   metoprolol succinate 50 MG 24 hr tablet Commonly known as:  TOPROL-XL Take 50 mg by mouth daily. Take with or immediately following a meal.   MULTIVITAMIN PO Take 1 tablet by mouth every morning.   potassium chloride 10 MEQ tablet Commonly known as:  K-DUR Take 1 tablet (10 mEq total) by mouth daily.   Vitamin D 2000 units tablet Take 2,000 Units by mouth every morning.   WELCHOL 625 MG tablet Generic drug:  colesevelam Take 1,875 mg by mouth 2 (two) times daily.            Durable Medical Equipment        Start     Ordered   09/21/16 1722  For home use only DME Walker rolling  Once    Comments:  5" wheels  Question:  Patient needs a walker to treat with the following condition  Answer:  Gait disturbance   09/21/16 1722     Follow-up Information    [facility] Follow up.   Why:  HH physical therapy Contact information: [address] [place] [address] [phone]          Allergies  Allergen Reactions  . Actonel [Risedronate Sodium] Nausea And Vomiting  . Codeine     Unknown   . Lipitor [Atorvastatin] Nausea And Vomiting and Other (See Comments)    Can only take low doses of med   . Macrobid [Nitrofurantoin Monohyd Macro] Swelling and Other (See Comments)    Headaches  . Septra [Sulfamethoxazole-Trimethoprim] Rash    Consultations:   Procedures/Studies: Dg Chest 2 View  Result Date: 09/20/2016 CLINICAL DATA:  83 y/o  F; syncope.  EXAM: CHEST  2 VIEW COMPARISON:  02/23/2011 chest radiograph FINDINGS: Mild cardiomegaly. Aortic atherosclerosis with calcification or. Hazy opacification of lung bases may represent atelectasis or mild edema. Underlying pneumonia is not excluded. Post median sternotomy with wires in alignment. Multilevel degenerative changes of the spine. IMPRESSION: Mild cardiomegaly. Hazy opacities at lung bases probably represent atelectasis or mild edema. Underlying pneumonia is not excluded. Electronically Signed   By: Mitzi Hansen M.D.   On: 09/20/2016 00:04   Ct Head Wo Contrast  Result Date: 09/20/2016 CLINICAL DATA:  Syncopal episodes. EXAM: CT HEAD WITHOUT CONTRAST TECHNIQUE: Contiguous axial images were obtained from the base of the skull through the vertex without intravenous contrast. COMPARISON:  MRI from 07/28/2006 FINDINGS: Brain: Moderate central atrophy with mild to moderate chronic appearing small vessel ischemic disease of periventricular and subcortical white matter. No definite intra-axial mass. No acute intracranial hemorrhage, midline shift or edema. No extra-axial fluid collections. Vascular: Atherosclerosis of the carotid siphons and both vertebral arteries. Skull: Negative for acute fracture or focal lesions. Sinuses/Orbits: Clear bilateral mastoids and paranasal sinuses. Intact globes. Other: None. IMPRESSION: Atrophy with mild to moderate chronic appearing small vessel ischemic disease of periventricular subcortical white matter. No acute intracranial abnormality.  Electronically Signed   By: Tollie Eth M.D.   On: 09/20/2016 00:09   Dg Abd Portable 1v  Result Date: 09/20/2016 CLINICAL DATA:  Acute onset of generalized abdominal distention. Initial encounter. EXAM: PORTABLE ABDOMEN - 1 VIEW COMPARISON:  CTA of the abdomen and pelvis performed 10/28/2009 FINDINGS: The visualized bowel gas pattern is unremarkable. Scattered air and stool filled loops of colon are seen; no abnormal  dilatation of small bowel loops is seen to suggest small bowel obstruction. No free intra-abdominal air is identified, though evaluation for free air is limited on a single supine view. Degenerative change is noted along the lower lumbar spine; the sacroiliac joints are unremarkable in appearance. The visualized lung bases are essentially clear. IMPRESSION: Unremarkable bowel gas pattern; no free intra-abdominal air seen. Moderate amount of stool noted in the colon. Electronically Signed   By: Roanna Raider M.D.   On: 09/20/2016 23:20       Subjective: Patient is feeling better, dyspnea has been improved, no wheezing, no chest pain, no nausea or vomiting.   Discharge Exam: Vitals:   09/23/16 0408 09/23/16 0955  BP: (!) 149/63 113/63  Pulse: 90   Resp: 18 18  Temp: 98.9 F (37.2 C) 97.7 F (36.5 C)   Vitals:   09/22/16 2135 09/23/16 0408 09/23/16 0728 09/23/16 0955  BP: 131/72 (!) 149/63  113/63  Pulse: 83 90    Resp: Temp: 98.2 F (36.8 C) 98.9 F (37.2 C)  97.7 F (36.5 C)  TempSrc: Oral Oral  Oral  SpO2: 97% 98% 96% 95%  Weight:  77.7 kg (171 lb 4.8 oz)    Height:   (1.626 m)      General: Pt is alert, awake, not in acute distress Cardiovascular: RRR, S1/S2 +, no rubs, no gallops Respiratory: CTA bilaterally, no wheezing, no rhonchi Abdominal: Soft, NT, ND, bowel sounds + Extremities: no edema, no cyanosis    The results of significant diagnostics from this hospitalization (including imaging, microbiology, ancillary and laboratory) are listed below for reference.     Microbiology: Recent Results (from the past 240 hour(s))  Urine culture     Status: Abnormal   Collection Time: 09/20/16 12:17 AM  Result Value Ref Range Status   Specimen Description URINE, RANDOM  Final   Special Requests NONE  Final   Culture >=100,000 COLONIES/mL ESCHERICHIA COLI (A)  Final   Report Status 09/22/2016 FINAL  Final   Organism ID, Bacteria ESCHERICHIA COLI (A)   Final      Susceptibility   Escherichia coli - MIC*    AMPICILLIN >=32 RESISTANT Resistant     CEFAZOLIN <=4 SENSITIVE Sensitive     CEFTRIAXONE <=1 SENSITIVE Sensitive     CIPROFLOXACIN <=0.25 SENSITIVE Sensitive     GENTAMICIN <=1 SENSITIVE Sensitive     IMIPENEM <=0.25 SENSITIVE Sensitive     NITROFURANTOIN <=16 SENSITIVE Sensitive     TRIMETH/SULFA <=20 SENSITIVE Sensitive     AMPICILLIN/SULBACTAM >=32 RESISTANT Resistant     PIP/TAZO <=4 SENSITIVE Sensitive     Extended ESBL NEGATIVE Sensitive     * >=100,000 COLONIES/mL ESCHERICHIA COLI  Culture, blood (Routine X 2) w Reflex to ID Panel     Status: None (Preliminary result)   Collection Time: 09/20/16  3:37 PM  Result Value Ref Range Status   Specimen Description LEFT ANTECUBITAL  Final   Special Requests BOTTLES DRAWN AEROBIC AND ANAEROBIC 5CC  Final   Culture   Final  NO GROWTH 2 DAYS Performed at [facility], [address] [place], [place] [address]    Report Status PENDING  Incomplete  Culture, blood (Routine X 2) w Reflex to ID Panel     Status: None (Preliminary result)   Collection Time: 09/20/16  3:43 PM  Result Value Ref Range Status   Specimen Description RIGHT ANTECUBITAL  Final   Special Requests IN PEDIATRIC BOTTLE 3.5CC  Final   Culture   Final    NO GROWTH 2 DAYS Performed at [facility], [address], [place], [place] [address]    Report Status PENDING  Incomplete     Labs: BNP (last 3 results)  Recent Labs  09/20/16 0502  BNP 623.5*   Basic Metabolic Panel:  Recent Labs Lab 09/19/16 2155 09/20/16 0502 09/21/16 0444 09/22/16 0447 09/23/16 0430  NA 128* 133* 130* 134* 133*  K 4.0 4.1 3.8 3.6 3.2*  CL 96* 98* 102 102 97*  CO2 21* 25 20* 24 26  GLUCOSE 265* 192* 222* 208* 175*  BUN 20 19 17 12 16   CREATININE 1.00 0.97 0.90 0.69 0.72  CALCIUM 8.4* 8.6* 8.0* 8.3* 8.4*   Liver Function Tests: No results for input(s): AST, ALT, ALKPHOS, BILITOT, PROT, ALBUMIN in the  last 168 hours. No results for input(s): LIPASE, AMYLASE in the last 168 hours. No results for input(s): AMMONIA in the last 168 hours. CBC:  Recent Labs Lab 09/19/16 2155 09/20/16 0502 09/21/16 0444 09/22/16 0447 09/23/16 0430  WBC 18.2* 15.9* 15.8* 13.1* 12.2*  NEUTROABS  --   --   --   --  8.2*  HGB 12.4 12.7 10.9* 11.1* 11.6*  HCT 36.7 37.5 31.9* 32.9* 33.2*  MCV 98.9 99.7 99.4 96.8 97.6  PLT 152 162 146* 165 191   Cardiac Enzymes:  Recent Labs Lab 09/19/16 2155 09/20/16 0502 09/20/16 1056 09/21/16 0444 09/21/16 1155  TROPONINI 0.03* 0.03* 0.03* 0.14* 0.10*   BNP: Invalid input(s): POCBNP CBG:  Recent Labs Lab 09/22/16 1322 09/22/16 1629 09/22/16 2226 09/23/16 0718 09/23/16 1156  GLUCAP 303* 151* 240* 184* 194*   D-Dimer No results for input(s): DDIMER in the last 72 hours. Hgb A1c  Recent Labs  09/22/16 0447  HGBA1C 7.7*   Lipid Profile No results for input(s): CHOL, HDL, LDLCALC, TRIG, CHOLHDL, LDLDIRECT in the last 72 hours. Thyroid function studies No results for input(s): TSH, T4TOTAL, T3FREE, THYROIDAB in the last 72 hours.  Invalid input(s): FREET3 Anemia work up No results for input(s): VITAMINB12, FOLATE, FERRITIN, TIBC, IRON, RETICCTPCT in the last 72 hours. Urinalysis    Component Value Date/Time   COLORURINE AMBER (A) 09/20/2016 0017   APPEARANCEUR TURBID (A) 09/20/2016 0017   LABSPEC 1.014 09/20/2016 0017   PHURINE 5.0 09/20/2016 0017   GLUCOSEU NEGATIVE 09/20/2016 0017   HGBUR SMALL (A) 09/20/2016 0017   BILIRUBINUR NEGATIVE 09/20/2016 0017   KETONESUR 20 (A) 09/20/2016 0017   PROTEINUR 100 (A) 09/20/2016 0017   UROBILINOGEN 0.2 04/18/2009 0212   NITRITE NEGATIVE 09/20/2016 0017   LEUKOCYTESUR LARGE (A) 09/20/2016 0017   Sepsis Labs Invalid input(s): PROCALCITONIN,  WBC,  LACTICIDVEN Microbiology Recent Results (from the past 240 hour(s))  Urine culture     Status: Abnormal   Collection Time: 09/20/16 12:17 AM  Result  Value Ref Range Status   Specimen Description URINE, RANDOM  Final   Special Requests NONE  Final   Culture >=100,000 COLONIES/mL ESCHERICHIA COLI (A)  Final   Report Status  09/22/2016 FINAL  Final   Organism ID, Bacteria ESCHERICHIA COLI (A)  Final      Susceptibility   Escherichia coli - MIC*    AMPICILLIN >=32 RESISTANT Resistant     CEFAZOLIN <=4 SENSITIVE Sensitive     CEFTRIAXONE <=1 SENSITIVE Sensitive     CIPROFLOXACIN <=0.25 SENSITIVE Sensitive     GENTAMICIN <=1 SENSITIVE Sensitive     IMIPENEM <=0.25 SENSITIVE Sensitive     NITROFURANTOIN <=16 SENSITIVE Sensitive     TRIMETH/SULFA <=20 SENSITIVE Sensitive     AMPICILLIN/SULBACTAM >=32 RESISTANT Resistant     PIP/TAZO <=4 SENSITIVE Sensitive     Extended ESBL NEGATIVE Sensitive     * >=100,000 COLONIES/mL ESCHERICHIA COLI  Culture, blood (Routine X 2) w Reflex to ID Panel     Status: None (Preliminary result)   Collection Time: 09/20/16  3:37 PM  Result Value Ref Range Status   Specimen Description LEFT ANTECUBITAL  Final   Special Requests BOTTLES DRAWN AEROBIC AND ANAEROBIC 5CC  Final   Culture   Final    NO GROWTH 2 DAYS Performed at [facility], [address], [place], [place] [address]    Report Status PENDING  Incomplete  Culture, blood (Routine X 2) w Reflex to ID Panel     Status: None (Preliminary result)   Collection Time: 09/20/16  3:43 PM  Result Value Ref Range Status   Specimen Description RIGHT ANTECUBITAL  Final   Special Requests IN PEDIATRIC BOTTLE 3.5CC  Final   Culture   Final    NO GROWTH 2 DAYS Performed at [facility], [address] [place], [place] [address]    Report Status PENDING  Incomplete     Time coordinating discharge: 45 minutes  SIGNED:   Coralie Keens, MD  [facility] 09/23/2016, 12:58 PM Pager   If 7PM-7AM, please contact night-coverage [email or web address] Password [email or web address]

## 2016-09-23 NOTE — Progress Notes (Signed)
D/c to home w/ dtr via w/c d/c instructions reviewed w/ verbal understanding

## 2016-09-25 LAB — CULTURE, BLOOD (ROUTINE X 2)
CULTURE: NO GROWTH
Culture: NO GROWTH

## 2016-10-06 DIAGNOSIS — A419 Sepsis, unspecified organism: Secondary | ICD-10-CM | POA: Diagnosis not present

## 2016-10-06 DIAGNOSIS — N179 Acute kidney failure, unspecified: Secondary | ICD-10-CM | POA: Diagnosis not present

## 2016-10-06 DIAGNOSIS — I10 Essential (primary) hypertension: Secondary | ICD-10-CM | POA: Diagnosis not present

## 2016-10-06 DIAGNOSIS — N390 Urinary tract infection, site not specified: Secondary | ICD-10-CM | POA: Diagnosis not present

## 2016-10-06 DIAGNOSIS — E871 Hypo-osmolality and hyponatremia: Secondary | ICD-10-CM | POA: Diagnosis not present

## 2017-01-05 DIAGNOSIS — I700 Atherosclerosis of aorta: Secondary | ICD-10-CM | POA: Diagnosis not present

## 2017-01-05 DIAGNOSIS — E782 Mixed hyperlipidemia: Secondary | ICD-10-CM | POA: Diagnosis not present

## 2017-01-05 DIAGNOSIS — E1151 Type 2 diabetes mellitus with diabetic peripheral angiopathy without gangrene: Secondary | ICD-10-CM | POA: Diagnosis not present

## 2017-01-05 DIAGNOSIS — Z7984 Long term (current) use of oral hypoglycemic drugs: Secondary | ICD-10-CM | POA: Diagnosis not present

## 2017-01-05 DIAGNOSIS — I10 Essential (primary) hypertension: Secondary | ICD-10-CM | POA: Diagnosis not present

## 2017-01-05 DIAGNOSIS — E113291 Type 2 diabetes mellitus with mild nonproliferative diabetic retinopathy without macular edema, right eye: Secondary | ICD-10-CM | POA: Diagnosis not present

## 2017-01-05 DIAGNOSIS — I739 Peripheral vascular disease, unspecified: Secondary | ICD-10-CM | POA: Diagnosis not present

## 2017-01-05 DIAGNOSIS — J449 Chronic obstructive pulmonary disease, unspecified: Secondary | ICD-10-CM | POA: Diagnosis not present

## 2017-03-22 DIAGNOSIS — E11319 Type 2 diabetes mellitus with unspecified diabetic retinopathy without macular edema: Secondary | ICD-10-CM | POA: Diagnosis not present

## 2017-03-22 DIAGNOSIS — Z7984 Long term (current) use of oral hypoglycemic drugs: Secondary | ICD-10-CM | POA: Diagnosis not present

## 2017-03-22 DIAGNOSIS — E782 Mixed hyperlipidemia: Secondary | ICD-10-CM | POA: Diagnosis not present

## 2017-03-22 DIAGNOSIS — J449 Chronic obstructive pulmonary disease, unspecified: Secondary | ICD-10-CM | POA: Diagnosis not present

## 2017-03-22 DIAGNOSIS — E1151 Type 2 diabetes mellitus with diabetic peripheral angiopathy without gangrene: Secondary | ICD-10-CM | POA: Diagnosis not present

## 2017-03-22 DIAGNOSIS — M810 Age-related osteoporosis without current pathological fracture: Secondary | ICD-10-CM | POA: Diagnosis not present

## 2017-03-22 DIAGNOSIS — I10 Essential (primary) hypertension: Secondary | ICD-10-CM | POA: Diagnosis not present

## 2017-03-22 DIAGNOSIS — I739 Peripheral vascular disease, unspecified: Secondary | ICD-10-CM | POA: Diagnosis not present

## 2017-03-22 DIAGNOSIS — Z0000 Encounter for general adult medical examination without abnormal findings: Secondary | ICD-10-CM | POA: Diagnosis not present

## 2017-03-22 DIAGNOSIS — I700 Atherosclerosis of aorta: Secondary | ICD-10-CM | POA: Diagnosis not present

## 2017-04-11 DIAGNOSIS — E119 Type 2 diabetes mellitus without complications: Secondary | ICD-10-CM | POA: Diagnosis not present

## 2017-04-11 DIAGNOSIS — H52203 Unspecified astigmatism, bilateral: Secondary | ICD-10-CM | POA: Diagnosis not present

## 2017-04-11 DIAGNOSIS — Z961 Presence of intraocular lens: Secondary | ICD-10-CM | POA: Diagnosis not present

## 2017-04-11 DIAGNOSIS — H26492 Other secondary cataract, left eye: Secondary | ICD-10-CM | POA: Diagnosis not present

## 2017-04-12 DIAGNOSIS — N3000 Acute cystitis without hematuria: Secondary | ICD-10-CM | POA: Diagnosis not present

## 2017-04-12 DIAGNOSIS — M9903 Segmental and somatic dysfunction of lumbar region: Secondary | ICD-10-CM | POA: Diagnosis not present

## 2017-04-12 DIAGNOSIS — M5136 Other intervertebral disc degeneration, lumbar region: Secondary | ICD-10-CM | POA: Diagnosis not present

## 2017-04-12 DIAGNOSIS — R2689 Other abnormalities of gait and mobility: Secondary | ICD-10-CM | POA: Diagnosis not present

## 2017-04-12 DIAGNOSIS — R413 Other amnesia: Secondary | ICD-10-CM | POA: Diagnosis not present

## 2017-04-28 ENCOUNTER — Inpatient Hospital Stay (HOSPITAL_COMMUNITY)
Admission: EM | Admit: 2017-04-28 | Discharge: 2017-05-01 | DRG: 300 | Disposition: A | Discharge status: Home Health | Payer: Medicare Other | Attending: Internal Medicine | Admitting: Internal Medicine

## 2017-04-28 ENCOUNTER — Encounter (HOSPITAL_COMMUNITY): Payer: Self-pay | Admitting: Emergency Medicine

## 2017-04-28 ENCOUNTER — Emergency Department (HOSPITAL_COMMUNITY): Payer: Medicare Other

## 2017-04-28 DIAGNOSIS — N3020 Other chronic cystitis without hematuria: Secondary | ICD-10-CM | POA: Diagnosis present

## 2017-04-28 DIAGNOSIS — I110 Hypertensive heart disease with heart failure: Secondary | ICD-10-CM | POA: Diagnosis not present

## 2017-04-28 DIAGNOSIS — Z888 Allergy status to other drugs, medicaments and biological substances status: Secondary | ICD-10-CM

## 2017-04-28 DIAGNOSIS — Z8679 Personal history of other diseases of the circulatory system: Secondary | ICD-10-CM

## 2017-04-28 DIAGNOSIS — M810 Age-related osteoporosis without current pathological fracture: Secondary | ICD-10-CM | POA: Diagnosis present

## 2017-04-28 DIAGNOSIS — Z792 Long term (current) use of antibiotics: Secondary | ICD-10-CM

## 2017-04-28 DIAGNOSIS — I5032 Chronic diastolic (congestive) heart failure: Secondary | ICD-10-CM | POA: Diagnosis present

## 2017-04-28 DIAGNOSIS — Z66 Do not resuscitate: Secondary | ICD-10-CM | POA: Diagnosis not present

## 2017-04-28 DIAGNOSIS — I7101 Dissection of thoracic aorta: Principal | ICD-10-CM | POA: Diagnosis present

## 2017-04-28 DIAGNOSIS — Z7982 Long term (current) use of aspirin: Secondary | ICD-10-CM

## 2017-04-28 DIAGNOSIS — R079 Chest pain, unspecified: Secondary | ICD-10-CM | POA: Diagnosis present

## 2017-04-28 DIAGNOSIS — E785 Hyperlipidemia, unspecified: Secondary | ICD-10-CM | POA: Diagnosis present

## 2017-04-28 DIAGNOSIS — R072 Precordial pain: Secondary | ICD-10-CM | POA: Diagnosis present

## 2017-04-28 DIAGNOSIS — Z885 Allergy status to narcotic agent status: Secondary | ICD-10-CM

## 2017-04-28 DIAGNOSIS — I513 Intracardiac thrombosis, not elsewhere classified: Secondary | ICD-10-CM | POA: Diagnosis present

## 2017-04-28 DIAGNOSIS — Z951 Presence of aortocoronary bypass graft: Secondary | ICD-10-CM

## 2017-04-28 DIAGNOSIS — R0602 Shortness of breath: Secondary | ICD-10-CM | POA: Diagnosis not present

## 2017-04-28 DIAGNOSIS — I700 Atherosclerosis of aorta: Secondary | ICD-10-CM | POA: Diagnosis present

## 2017-04-28 DIAGNOSIS — E876 Hypokalemia: Secondary | ICD-10-CM | POA: Diagnosis present

## 2017-04-28 DIAGNOSIS — Z7984 Long term (current) use of oral hypoglycemic drugs: Secondary | ICD-10-CM

## 2017-04-28 DIAGNOSIS — Z882 Allergy status to sulfonamides status: Secondary | ICD-10-CM

## 2017-04-28 DIAGNOSIS — J449 Chronic obstructive pulmonary disease, unspecified: Secondary | ICD-10-CM | POA: Diagnosis present

## 2017-04-28 DIAGNOSIS — Z79899 Other long term (current) drug therapy: Secondary | ICD-10-CM

## 2017-04-28 DIAGNOSIS — E118 Type 2 diabetes mellitus with unspecified complications: Secondary | ICD-10-CM | POA: Diagnosis present

## 2017-04-28 DIAGNOSIS — Z7189 Other specified counseling: Secondary | ICD-10-CM

## 2017-04-28 DIAGNOSIS — I10 Essential (primary) hypertension: Secondary | ICD-10-CM | POA: Diagnosis present

## 2017-04-28 DIAGNOSIS — I7100 Dissection of unspecified site of aorta: Secondary | ICD-10-CM | POA: Diagnosis present

## 2017-04-28 LAB — BASIC METABOLIC PANEL
Anion gap: 10 (ref 5–15)
BUN: 20 mg/dL (ref 6–20)
CO2: 23 mmol/L (ref 22–32)
Calcium: 8.6 mg/dL — ABNORMAL LOW (ref 8.9–10.3)
Chloride: 101 mmol/L (ref 101–111)
Creatinine, Ser: 0.89 mg/dL (ref 0.44–1.00)
GFR calc Af Amer: >60 mL/min (ref >60)
GFR, EST NON AFRICAN AMERICAN: 55 mL/min — AB (ref >60)
Glucose, Bld: 229 mg/dL — ABNORMAL HIGH (ref 65–99)
POTASSIUM: 3.5 mmol/L (ref 3.5–5.1)
SODIUM: 134 mmol/L — AB (ref 135–145)

## 2017-04-28 LAB — I-STAT TROPONIN, ED: TROPONIN I, POC: 0 ng/mL (ref 0.00–0.08)

## 2017-04-28 LAB — CBC WITH DIFFERENTIAL/PLATELET
BASOS ABS: 0 10*3/uL (ref 0.0–0.1)
BASOS PCT: 0 %
EOS ABS: 0.2 10*3/uL (ref 0.0–0.7)
EOS PCT: 3 %
HCT: 39.4 % (ref 36.0–46.0)
Hemoglobin: 13 g/dL (ref 12.0–15.0)
LYMPHS PCT: 39 %
Lymphs Abs: 3 10*3/uL (ref 0.7–4.0)
MCH: 33.3 pg (ref 26.0–34.0)
MCHC: 33 g/dL (ref 30.0–36.0)
MCV: 101 fL — AB (ref 78.0–100.0)
Monocytes Absolute: 0.7 10*3/uL (ref 0.1–1.0)
Monocytes Relative: 10 %
Neutro Abs: 3.8 10*3/uL (ref 1.7–7.7)
Neutrophils Relative %: 48 %
PLATELETS: 181 10*3/uL (ref 150–400)
RBC: 3.9 MIL/uL (ref 3.87–5.11)
RDW: 12.8 % (ref 11.5–15.5)
WBC: 7.8 10*3/uL (ref 4.0–10.5)

## 2017-04-28 MED ORDER — MORPHINE SULFATE (PF) 4 MG/ML IV SOLN
4.0000 mg | Freq: Once | INTRAVENOUS | Status: AC
  Administered 2017-04-28: 4 mg via INTRAVENOUS
  Filled 2017-04-28: qty 1

## 2017-04-28 NOTE — ED Notes (Signed)
ED provider at bedside.

## 2017-04-28 NOTE — ED Triage Notes (Signed)
Pt arrives via EMS with complaints of sudden crushing central  chest pain 10/10 with SOB. EMS gave 325 ASA, 0.4 nitro, & 4 mg morphine. Upon arrival, pt denies sharp chest pain, but endorses chest discomfort/ crushing feeling.

## 2017-04-28 NOTE — ED Provider Notes (Addendum)
[facility] EMERGENCY DEPARTMENT Provider Note   CSN: [id number] Arrival date & time: 04/28/17  2257     History   Chief Complaint Chief Complaint  Patient presents with  . Chest Pain    HPI [name] is a 83 y.o. female.  The history is provided by the patient and a relative.  She has history of giant cell arteritis, aortic dissection, diabetes, hypertension, hyperlipidemia, COPD.  This evening, she was watching television and got up and felt like something was pulling her down.  She sat down.  Her family member noted that she was pale and clammy.  When she sat down, she developed a crushing feeling in her chest without radiation.  There is associated dyspnea, diaphoresis but no nausea.  She had been admitted to the hospital for syncope 6 months ago, but this was a different feeling.  She called for an ambulance.  EMS gave her aspirin and nitroglycerin with no relief.  They gave her morphine which gave some relief.  Her discomfort was 8/10 at its worst, now 5/10.  She is a non-smoker.  Past Medical History:  Diagnosis Date  . Aortic atherosclerosis (HCC)    on [place] 05/13/13  . Aortic dissection (HCC)    Dr. Morton Peters  . Chronic cystitis    Dr. Sherron Monday  . COPD (chronic obstructive pulmonary disease) (HCC)   . Diabetes mellitus without complication (HCC)   . Hyperlipidemia   . Hypertension   . Osteoporosis    giant cell arteritis, Dr. Kellie Simmering    Patient Active Problem List   Diagnosis Date Noted  . Syncope 09/20/2016  . Hyponatremia 09/20/2016  . Acute cystitis without hematuria 09/20/2016  . Dehydration 09/20/2016  . Type 2 diabetes mellitus with complication, without long-term current use of insulin (HCC) 09/20/2016  . Elevated troponin 09/20/2016  . Essential hypertension 09/20/2016  . Acute diastolic CHF (congestive heart failure) (HCC) 09/20/2016  . Sepsis, unspecified organism (HCC) 09/20/2016  . Acute lower UTI 09/20/2016  .  Palpitations 08/01/2013    Past Surgical History:  Procedure Laterality Date  . aortic acrh surgery      OB History    No data available       Home Medications    Prior to Admission medications   Medication Sig Start Date End Date Taking? Authorizing Provider  aspirin 81 MG tablet Take 81 mg by mouth every morning.     [provider]  atorvastatin (LIPITOR) 20 MG tablet Take 20 mg by mouth every morning.     [provider]  calcium carbonate 200 MG capsule Take 2,000 mg by mouth every morning.     [provider]  Cholecalciferol (VITAMIN D) 2000 UNITS tablet Take 2,000 Units by mouth every morning.     [provider]  furosemide (LASIX) 20 MG tablet Take 20 mg by mouth every morning.     [provider]  metFORMIN (GLUCOPHAGE) 500 MG tablet Take 500 mg by mouth 2 (two) times daily. 08/29/16   [provider]  metoprolol succinate (TOPROL-XL) 50 MG 24 hr tablet Take 50 mg by mouth daily. Take with or immediately following a meal.    [provider]  Multiple Vitamins-Minerals (MULTIVITAMIN PO) Take 1 tablet by mouth every morning.     [provider]  Omega-3 Fatty Acids (FISH OIL) 1000 MG CAPS Take 1 capsule by mouth every morning.     [provider]  potassium chloride (K-DUR) 10  MEQ tablet Take 1 tablet (10 mEq total) by mouth daily. 09/23/16   Arrien, York Ram, MD  [facility] 625 MG tablet Take 1,875 mg by mouth 2 (two) times daily. 08/31/16   [provider]    Family History Family History  Problem Relation Age of Onset  . Heart attack Father   . Bone cancer Sister     Social History Social History  Substance Use Topics  . Smoking status: Never Smoker  . Smokeless tobacco: Never Used  . Alcohol use No     Allergies   Actonel [risedronate sodium]; Codeine; Lipitor [atorvastatin]; Macrobid [nitrofurantoin monohyd macro]; and Septra  [sulfamethoxazole-trimethoprim]   Review of Systems Review of Systems  All other systems reviewed and are negative.    Physical Exam Updated Vital Signs BP (!) 166/80   Temp 97.8 F (36.6 C) (Oral)   SpO2 98%   Physical Exam  Nursing note and vitals reviewed.  83 year old female, resting comfortably and in no acute distress. Vital signs are significant for hypertension. Oxygen saturation is 98%, which is normal. Head is normocephalic and atraumatic. PERRLA, EOMI. Oropharynx is clear. Neck is nontender and supple without adenopathy.  JVD present at 90 degrees. Back is nontender and there is no CVA tenderness. Lungs are clear without rales, wheezes, or rhonchi. Chest is nontender. Heart has regular rate and rhythm without murmur. Abdomen is soft, flat, nontender without masses or hepatosplenomegaly and peristalsis is normoactive. Extremities have 1-2+ edema, full range of motion is present. Skin is warm and dry without rash. Neurologic: Mental status is normal, cranial nerves are intact, there are no motor or sensory deficits.  ED Treatments / Results  Labs (all labs ordered are listed, but only abnormal results are displayed) Labs Reviewed  BASIC METABOLIC PANEL - Abnormal; Notable for the following:       Result Value   Sodium 134 (*)    Glucose, Bld 229 (*)    Calcium 8.6 (*)    GFR calc non Af Amer 55 (*)    All other components within normal limits  CBC WITH DIFFERENTIAL/PLATELET - Abnormal; Notable for the following:    MCV 101.0 (*)    All other components within normal limits  BRAIN NATRIURETIC PEPTIDE - Abnormal; Notable for the following:    B Natriuretic Peptide 169.5 (*)    All other components within normal limits  I-STAT TROPONIN, ED    EKG  EKG Interpretation  Date/Time:  Friday April 28 2017 23:02:19 EDT Ventricular Rate:  81 PR Interval:    QRS Duration: 92 QT Interval:  408 QTC Calculation: 474 R Axis:   54 Text Interpretation:  Sinus  rhythm Atrial premature complexes Baseline wander in lead(s) II III aVF When compared with ECG of 09/19/2016, No significant change was found Confirmed by Dione Booze ([id number]) on 04/28/2017 11:15:37 PM       Radiology Dg Chest Port 1 View  Result Date: 04/28/2017 CLINICAL DATA:  83 year old female with chest pressure and shortness of breath. EXAM: PORTABLE CHEST 1 VIEW COMPARISON:  Chest radiograph dated 09/19/2016 FINDINGS: There is shallow inspiration. Left lung base atelectatic changes noted. No focal consolidation, pleural effusion, or pneumothorax. The cardiac silhouette is within normal limits. There is atherosclerotic calcification of the aortic arch. Median sternotomy wires and CABG vascular clips noted. No acute osseous pathology. IMPRESSION: Shallow inspiration with atelectatic changes. No focal consolidation. Electronically Signed   By: Elgie Collard M.D.   On: 04/28/2017 23:28  Procedures Procedures (including critical care time)  Medications Ordered in ED Medications  morphine 4 MG/ML injection 4 mg (0 mg Intravenous Hold 04/29/17 0049)  morphine 4 MG/ML injection 4 mg (4 mg Intravenous Given 04/28/17 2316)     Initial Impression / Assessment and Plan / ED Course  I have reviewed the triage vital signs and the nursing notes.  Pertinent labs & imaging results that were available during my care of the patient were reviewed by me and considered in my medical decision making (see chart for details).  Chest discomfort worrisome for coronary artery disease.  Patient does have significant risk factors.  Initial ECG shows no acute changes.  Old records are reviewed confirming recent hospitalization for syncope, but no other hospitalizations in the last 8 years.  Will check troponin.  Given nature of history and her risk factors, she will need to be admitted regardless of testing results.  Heart score = 6, which puts her at moderate risk for major adverse cardiac events in the next 30  days.  She had partial relief of pain with morphine.  A second dose of morphine is ordered.  Repeat troponin is also normal.  Case is discussed with Dr. Clyde LundborgNiu of [facility], who agrees to admit the patient.  Final Clinical Impressions(s) / ED Diagnoses   Final diagnoses:  Chest pain, unspecified type    New Prescriptions New Prescriptions   No medications on file     Dione BoozeGlick, Cyle Kenyon, MD 04/29/17 0131  Dr. Clyde LundborgNiu had ordered CT angiogram of chest, abdomen, pelvis.  Report came of a bulging out of the ascending aorta, and probable new mural thrombus of the descending aorta.  He requested patient be admitted to intensivist.  I have contacted Dr. Freida BusmanAllen who is reviewed the scans and also an old CT angiogram in 2012.  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  Mural thrombus appears to be new, but is not emergent.  He suggests patient should be admitted to medicine service with consult to vascular surgery.  This information was given to Dr. Clyde LundborgNiu, who agrees to arrange for hospital admission under hospitalist service.   Dione BoozeGlick, Anakin Varkey, MD 04/29/17 [phone]

## 2017-04-29 ENCOUNTER — Inpatient Hospital Stay (HOSPITAL_COMMUNITY): Payer: Medicare Other

## 2017-04-29 DIAGNOSIS — I7102 Dissection of abdominal aorta: Secondary | ICD-10-CM | POA: Diagnosis not present

## 2017-04-29 DIAGNOSIS — I10 Essential (primary) hypertension: Secondary | ICD-10-CM

## 2017-04-29 DIAGNOSIS — Z951 Presence of aortocoronary bypass graft: Secondary | ICD-10-CM | POA: Diagnosis not present

## 2017-04-29 DIAGNOSIS — Z8679 Personal history of other diseases of the circulatory system: Secondary | ICD-10-CM | POA: Diagnosis not present

## 2017-04-29 DIAGNOSIS — J449 Chronic obstructive pulmonary disease, unspecified: Secondary | ICD-10-CM | POA: Diagnosis not present

## 2017-04-29 DIAGNOSIS — E785 Hyperlipidemia, unspecified: Secondary | ICD-10-CM | POA: Diagnosis not present

## 2017-04-29 DIAGNOSIS — N3020 Other chronic cystitis without hematuria: Secondary | ICD-10-CM | POA: Diagnosis present

## 2017-04-29 DIAGNOSIS — I351 Nonrheumatic aortic (valve) insufficiency: Secondary | ICD-10-CM | POA: Diagnosis not present

## 2017-04-29 DIAGNOSIS — I7100 Dissection of unspecified site of aorta: Secondary | ICD-10-CM

## 2017-04-29 DIAGNOSIS — Z7984 Long term (current) use of oral hypoglycemic drugs: Secondary | ICD-10-CM | POA: Diagnosis not present

## 2017-04-29 DIAGNOSIS — Z7982 Long term (current) use of aspirin: Secondary | ICD-10-CM | POA: Diagnosis not present

## 2017-04-29 DIAGNOSIS — R072 Precordial pain: Secondary | ICD-10-CM | POA: Diagnosis present

## 2017-04-29 DIAGNOSIS — R079 Chest pain, unspecified: Secondary | ICD-10-CM

## 2017-04-29 DIAGNOSIS — Z7189 Other specified counseling: Secondary | ICD-10-CM | POA: Diagnosis not present

## 2017-04-29 DIAGNOSIS — Z885 Allergy status to narcotic agent status: Secondary | ICD-10-CM | POA: Diagnosis not present

## 2017-04-29 DIAGNOSIS — Z66 Do not resuscitate: Secondary | ICD-10-CM | POA: Diagnosis not present

## 2017-04-29 DIAGNOSIS — Z888 Allergy status to other drugs, medicaments and biological substances status: Secondary | ICD-10-CM | POA: Diagnosis not present

## 2017-04-29 DIAGNOSIS — I110 Hypertensive heart disease with heart failure: Secondary | ICD-10-CM | POA: Diagnosis present

## 2017-04-29 DIAGNOSIS — Z792 Long term (current) use of antibiotics: Secondary | ICD-10-CM | POA: Diagnosis not present

## 2017-04-29 DIAGNOSIS — R0602 Shortness of breath: Secondary | ICD-10-CM | POA: Diagnosis not present

## 2017-04-29 DIAGNOSIS — E876 Hypokalemia: Secondary | ICD-10-CM | POA: Diagnosis present

## 2017-04-29 DIAGNOSIS — I7101 Dissection of thoracic aorta: Secondary | ICD-10-CM | POA: Diagnosis not present

## 2017-04-29 DIAGNOSIS — K8020 Calculus of gallbladder without cholecystitis without obstruction: Secondary | ICD-10-CM | POA: Diagnosis not present

## 2017-04-29 DIAGNOSIS — Z882 Allergy status to sulfonamides status: Secondary | ICD-10-CM | POA: Diagnosis not present

## 2017-04-29 DIAGNOSIS — E118 Type 2 diabetes mellitus with unspecified complications: Secondary | ICD-10-CM | POA: Diagnosis not present

## 2017-04-29 DIAGNOSIS — I5032 Chronic diastolic (congestive) heart failure: Secondary | ICD-10-CM | POA: Diagnosis not present

## 2017-04-29 DIAGNOSIS — M810 Age-related osteoporosis without current pathological fracture: Secondary | ICD-10-CM | POA: Diagnosis present

## 2017-04-29 DIAGNOSIS — I700 Atherosclerosis of aorta: Secondary | ICD-10-CM | POA: Diagnosis present

## 2017-04-29 DIAGNOSIS — I513 Intracardiac thrombosis, not elsewhere classified: Secondary | ICD-10-CM | POA: Diagnosis present

## 2017-04-29 DIAGNOSIS — Z79899 Other long term (current) drug therapy: Secondary | ICD-10-CM | POA: Diagnosis not present

## 2017-04-29 LAB — GLUCOSE, CAPILLARY
GLUCOSE-CAPILLARY: 183 mg/dL — AB (ref 65–99)
Glucose-Capillary: 124 mg/dL — ABNORMAL HIGH (ref 65–99)
Glucose-Capillary: 146 mg/dL — ABNORMAL HIGH (ref 65–99)
Glucose-Capillary: 198 mg/dL — ABNORMAL HIGH (ref 65–99)
Glucose-Capillary: 229 mg/dL — ABNORMAL HIGH (ref 65–99)

## 2017-04-29 LAB — HEMOGLOBIN A1C
HEMOGLOBIN A1C: 8 % — AB (ref 4.8–5.6)
MEAN PLASMA GLUCOSE: 182.9 mg/dL

## 2017-04-29 LAB — LIPID PANEL
Cholesterol: 198 mg/dL (ref 0–200)
HDL: 49 mg/dL (ref >40)
LDL Cholesterol: 124 mg/dL — ABNORMAL HIGH (ref 0–99)
Total CHOL/HDL Ratio: 4 RATIO
Triglycerides: 123 mg/dL (ref <150)
VLDL: 25 mg/dL (ref 0–40)

## 2017-04-29 LAB — BRAIN NATRIURETIC PEPTIDE: B NATRIURETIC PEPTIDE 5: 169.5 pg/mL — AB (ref 0.0–100.0)

## 2017-04-29 LAB — CBG MONITORING, ED: GLUCOSE-CAPILLARY: 193 mg/dL — AB (ref 65–99)

## 2017-04-29 LAB — TROPONIN I: Troponin I: <0.03 ng/mL (ref <0.03)

## 2017-04-29 LAB — MRSA PCR SCREENING: MRSA by PCR: NEGATIVE

## 2017-04-29 MED ORDER — IOPAMIDOL (ISOVUE-370) INJECTION 76%
INTRAVENOUS | Status: AC
  Administered 2017-04-29: 100 mL
  Filled 2017-04-29: qty 100

## 2017-04-29 MED ORDER — HYDRALAZINE HCL 20 MG/ML IJ SOLN: 5.0000 mg | INTRAMUSCULAR | Status: DC | PRN

## 2017-04-29 MED ORDER — OMEGA-3-ACID ETHYL ESTERS 1 G PO CAPS
1.0000 g | ORAL_CAPSULE | Freq: Every day | ORAL | Status: DC
  Administered 2017-04-29 – 2017-05-01 (×3): 1 g via ORAL
  Filled 2017-04-29 (×3): qty 1

## 2017-04-29 MED ORDER — INSULIN ASPART 100 UNIT/ML ~~LOC~~ SOLN: 0.0000 [IU] | Freq: Three times a day (TID) | SUBCUTANEOUS | Status: DC

## 2017-04-29 MED ORDER — POTASSIUM CHLORIDE CRYS ER 20 MEQ PO TBCR
40.0000 meq | EXTENDED_RELEASE_TABLET | Freq: Once | ORAL | Status: AC
  Administered 2017-04-29: 40 meq via ORAL
  Filled 2017-04-29: qty 2

## 2017-04-29 MED ORDER — INSULIN ASPART 100 UNIT/ML ~~LOC~~ SOLN
2.0000 [IU] | SUBCUTANEOUS | Status: DC
  Administered 2017-04-29: 6 [IU] via SUBCUTANEOUS
  Administered 2017-04-29 (×2): 2 [IU] via SUBCUTANEOUS
  Administered 2017-04-29: 4 [IU] via SUBCUTANEOUS
  Administered 2017-04-30 (×2): 6 [IU] via SUBCUTANEOUS
  Administered 2017-04-30: 4 [IU] via SUBCUTANEOUS
  Administered 2017-04-30: 6 [IU] via SUBCUTANEOUS
  Administered 2017-04-30: 4 [IU] via SUBCUTANEOUS
  Administered 2017-04-30: 2 [IU] via SUBCUTANEOUS
  Administered 2017-05-01: 6 [IU] via SUBCUTANEOUS
  Administered 2017-05-01 (×2): 4 [IU] via SUBCUTANEOUS
  Administered 2017-05-01 (×2): 6 [IU] via SUBCUTANEOUS

## 2017-04-29 MED ORDER — ONDANSETRON HCL 4 MG/2ML IJ SOLN: 4.0000 mg | Freq: Four times a day (QID) | INTRAMUSCULAR | Status: DC | PRN

## 2017-04-29 MED ORDER — MORPHINE SULFATE (PF) 4 MG/ML IV SOLN
1.0000 mg | INTRAVENOUS | Status: DC | PRN
  Administered 2017-05-01: 1 mg via INTRAVENOUS
  Filled 2017-04-29: qty 1

## 2017-04-29 MED ORDER — METOPROLOL SUCCINATE ER 50 MG PO TB24
50.0000 mg | ORAL_TABLET | Freq: Every day | ORAL | Status: DC
  Administered 2017-04-29 – 2017-05-01 (×3): 50 mg via ORAL
  Filled 2017-04-29 (×3): qty 1

## 2017-04-29 MED ORDER — TRIMETHOPRIM 100 MG PO TABS
100.0000 mg | ORAL_TABLET | Freq: Every day | ORAL | Status: DC
  Administered 2017-04-29 – 2017-05-01 (×3): 100 mg via ORAL
  Filled 2017-04-29 (×4): qty 1

## 2017-04-29 MED ORDER — MORPHINE SULFATE (PF) 4 MG/ML IV SOLN: 2.0000 mg | INTRAVENOUS | Status: DC | PRN

## 2017-04-29 MED ORDER — MORPHINE SULFATE (PF) 4 MG/ML IV SOLN
4.0000 mg | Freq: Once | INTRAVENOUS | Status: DC
  Administered 2017-04-29: 4 mg via INTRAVENOUS
  Filled 2017-04-29: qty 1

## 2017-04-29 MED ORDER — ACETAMINOPHEN 325 MG PO TABS
650.0000 mg | ORAL_TABLET | ORAL | Status: DC | PRN
  Administered 2017-04-30: 650 mg via ORAL
  Filled 2017-04-29: qty 2

## 2017-04-29 MED ORDER — FUROSEMIDE 20 MG PO TABS
20.0000 mg | ORAL_TABLET | Freq: Every day | ORAL | Status: DC
  Administered 2017-04-29 – 2017-05-01 (×3): 20 mg via ORAL
  Filled 2017-04-29 (×3): qty 1

## 2017-04-29 MED ORDER — ADULT MULTIVITAMIN W/MINERALS CH
1.0000 | ORAL_TABLET | Freq: Every day | ORAL | Status: DC
  Administered 2017-04-29 – 2017-05-01 (×3): 1 via ORAL
  Filled 2017-04-29 (×3): qty 1

## 2017-04-29 MED ORDER — SODIUM CHLORIDE 0.9 % IV SOLN: 250.0000 mL | INTRAVENOUS | Status: DC | PRN

## 2017-04-29 MED ORDER — ASPIRIN 81 MG PO CHEW: 81.0000 mg | CHEWABLE_TABLET | ORAL | Status: DC

## 2017-04-29 MED ORDER — VITAMIN D 1000 UNITS PO TABS
2000.0000 [IU] | ORAL_TABLET | Freq: Every day | ORAL | Status: DC
  Administered 2017-04-29 – 2017-05-01 (×3): 2000 [IU] via ORAL
  Filled 2017-04-29 (×3): qty 2

## 2017-04-29 MED ORDER — CALCIUM CARBONATE 1250 (500 CA) MG PO TABS
1250.0000 mg | ORAL_TABLET | Freq: Every day | ORAL | Status: DC
  Administered 2017-04-29 – 2017-05-01 (×3): 1250 mg via ORAL
  Filled 2017-04-29 (×4): qty 1

## 2017-04-29 MED ORDER — INSULIN ASPART 100 UNIT/ML ~~LOC~~ SOLN: 0.0000 [IU] | Freq: Every day | SUBCUTANEOUS | Status: DC

## 2017-04-29 MED ORDER — ZOLPIDEM TARTRATE 5 MG PO TABS: 5.0000 mg | ORAL_TABLET | Freq: Every evening | ORAL | Status: DC | PRN

## 2017-04-29 MED ORDER — ATORVASTATIN CALCIUM 20 MG PO TABS
20.0000 mg | ORAL_TABLET | Freq: Every day | ORAL | Status: DC
  Administered 2017-04-29 – 2017-05-01 (×3): 20 mg via ORAL
  Filled 2017-04-29 (×3): qty 1

## 2017-04-29 MED ORDER — ALBUTEROL SULFATE (2.5 MG/3ML) 0.083% IN NEBU: 2.5000 mg | INHALATION_SOLUTION | Freq: Four times a day (QID) | RESPIRATORY_TRACT | Status: DC | PRN

## 2017-04-29 NOTE — ED Notes (Signed)
CCM at bedside 

## 2017-04-29 NOTE — Progress Notes (Signed)
Pt transferred to 2 C11. VS stable. Pt tolerated well. Family is aware.

## 2017-04-29 NOTE — ED Notes (Signed)
Taken to CT at this time. 

## 2017-04-29 NOTE — Plan of Care (Signed)
Problem: Skin Integrity: Goal: Risk for impaired skin integrity will decrease Outcome: Progressing Pt encouraged to frequent turns to prevent pressure injury  Problem: Bowel/Gastric: Goal: Will not experience complications related to bowel motility Outcome: Progressing Pt states " go go every morning", eat healthy

## 2017-04-29 NOTE — Consult Note (Signed)
Name: [name] MRN: [id number] DOB: 12-08-1923    ADMISSION DATE:  04/28/2017 CONSULTATION DATE:  04/29/2017  REFERRING MD :  Dr. Clyde Lundborg   CHIEF COMPLAINT: Chest Pain   HISTORY OF PRESENT ILLNESS:   83 year old female with PMH of aortic dissection (repaired in 2008), HTN, HLD, DM, COPD, dCHF  Presents to ED on 11/2 with substernal chest pain in which radiates to the left scapula and is associated with shortness of breath. Negative Troponin. WBC 7.8, BNP 169.5, negative CXR, HD stable, CTA of aorta revealed sub centimeter ascending aortic outpouching concerning for partially thrombosed ulcer and long segment descending thoracic aorta intimal hematoma. CT Surgery Consulted. PCCM asked to monitor in ICU.   SIGNIFICANT EVENTS  11/2 > Presents to ED   STUDIES:  CXR 11/2 > Shallow inspiration with atelectatic changes. No focal consolidation CTA Chest 11/3 > . Subcentimeter ascending aortic outpouching concerning for partially thrombosed penetrating ulcer.Long segment descending thoracic aorta intimal hematoma, potentially acute. Mild cardiomegaly.  No acute pulmonary process. No acute vascular process or or acute intra-abdominal/pelvic disease. Cholelithiasis without acute cholecystitis. ECHO 11/3 >>>   PAST MEDICAL HISTORY :   has a past medical history of Aortic atherosclerosis (HCC); Aortic dissection (HCC); Chronic cystitis; COPD (chronic obstructive pulmonary disease) (HCC); Diabetes mellitus without complication (HCC); Hyperlipidemia; Hypertension; and Osteoporosis.  has a past surgical history that includes aortic acrh surgery. Prior to Admission medications   Medication Sig Start Date End Date Taking? Authorizing Provider  aspirin 81 MG tablet Take 81 mg by mouth every morning.    Yes [provider]  atorvastatin (LIPITOR) 20 MG tablet Take 20 mg by mouth every morning.    Yes [provider]  calcium carbonate 200 MG capsule Take 2,000 mg by mouth every  morning.    Yes [provider]  Cholecalciferol (VITAMIN D) 2000 UNITS tablet Take 2,000 Units by mouth every morning.    Yes [provider]  furosemide (LASIX) 20 MG tablet Take 20 mg by mouth every morning.    Yes [provider]  metFORMIN (GLUCOPHAGE) 500 MG tablet Take 500 mg by mouth 2 (two) times daily. 08/29/16  Yes [provider]  metoprolol succinate (TOPROL-XL) 50 MG 24 hr tablet Take 50 mg by mouth daily. Take with or immediately following a meal.   Yes [provider]  Multiple Vitamins-Minerals (MULTIVITAMIN PO) Take 1 tablet by mouth every morning.    Yes [provider]  Omega-3 Fatty Acids (FISH OIL) 1000 MG CAPS Take 1,000 mg by mouth daily.    Yes [provider]  potassium chloride (K-DUR) 10 MEQ tablet Take 1 tablet (10 mEq total) by mouth daily. 09/23/16  Yes Arrien, York Ram, MD  trimethoprim (TRIMPEX) 100 MG tablet Take 100 mg by mouth daily.   Yes [provider]   Allergies  Allergen Reactions  . Actonel [Risedronate Sodium] Nausea And Vomiting  . Codeine     Unknown   . Lipitor [Atorvastatin] Nausea And Vomiting and Other (See Comments)    Can only take low doses of med   . Macrobid [Nitrofurantoin Monohyd Macro] Swelling and Other (See Comments)    Headaches  . Septra [Sulfamethoxazole-Trimethoprim] Rash    FAMILY HISTORY:  family history includes Bone cancer in her sister; Heart attack in her father. SOCIAL HISTORY:  reports that she has never smoked. She has never used smokeless tobacco. She reports that she does not drink alcohol or use drugs.  REVIEW OF SYSTEMS:   All negative; except for those that are bolded, which indicate positives.  Constitutional: weight loss, weight gain, night sweats, fevers, chills, fatigue, weakness.  HEENT: headaches, sore throat, sneezing, nasal congestion, post nasal drip, difficulty swallowing, tooth/dental problems, visual complaints, visual  changes, ear aches. Neuro: difficulty with speech, weakness, numbness, ataxia. CV:  chest pain, orthopnea, PND, swelling in lower extremities, dizziness, palpitations, syncope.  Resp: cough, hemoptysis, dyspnea, wheezing. GI: heartburn, indigestion, abdominal pain, nausea, vomiting, diarrhea, constipation, change in bowel habits, loss of appetite, hematemesis, melena, hematochezia.  GU: dysuria, change in color of urine, urgency or frequency, flank pain, hematuria. MSK: joint pain or swelling, decreased range of motion. Psych: change in mood or affect, depression, anxiety, suicidal ideations, homicidal ideations. Skin: rash, itching, bruising.   SUBJECTIVE:   VITAL SIGNS: Temp:  [97.8 F (36.6 C)] 97.8 F (36.6 C) (11/02 2306) Pulse Rate:  [62-81] 81 (11/03 0530) Resp:  [11-30] 30 (11/03 0530) BP: (126-175)/(49-89) 134/55 (11/03 0530) SpO2:  [91 %-98 %] 93 % (11/03 0530)  PHYSICAL EXAMINATION: General:  Elderly female, no distress  Neuro:  Alert, slow to respond, follows commands  HEENT:  Dry MM  Cardiovascular:  Irregular, no MRG  Lungs:  Clear breath sounds, no wheeze/crackles  Abdomen:  Non-tender, active bowel sounds  Musculoskeletal:  -edema  Skin:  Warm, dry, intact    Recent Labs Lab 04/28/17 2308  NA 134*  K 3.5  CL 101  CO2 23  BUN 20  CREATININE 0.89  GLUCOSE 229*    Recent Labs Lab 04/28/17 2308  HGB 13.0  HCT 39.4  WBC 7.8  PLT 181   Dg Chest Port 1 View  Result Date: 04/28/2017 CLINICAL DATA:  83 year old female with chest pressure and shortness of breath. EXAM: PORTABLE CHEST 1 VIEW COMPARISON:  Chest radiograph dated 09/19/2016 FINDINGS: There is shallow inspiration. Left lung base atelectatic changes noted. No focal consolidation, pleural effusion, or pneumothorax. The cardiac silhouette is within normal limits. There is atherosclerotic calcification of the aortic arch. Median sternotomy wires and CABG vascular clips noted. No acute osseous  pathology. IMPRESSION: Shallow inspiration with atelectatic changes. No focal consolidation. Electronically Signed   By: Elgie Collard M.D.   On: 04/28/2017 23:28   Ct Angio Chest/abd/pel For Dissection W And/or W/wo  Addendum Date: 04/29/2017   ADDENDUM REPORT: 04/29/2017 04:18 ADDENDUM: CT chest Nov 03, 2010 now submitted for comparison. The focal outpouching of the ascending aorta is relatively unchanged, and may be related to prior surgery. The descending thoracic intimal hematoma is new. Findings discussed with and reconfirmed by [facility] on 04/29/2017 at 4:15 am. Electronically Signed   By: Awilda Metro M.D.   On: 04/29/2017 04:18   Addendum Date: 04/29/2017   ADDENDUM REPORT: 04/29/2017 03:10 ADDENDUM: Critical Value/emergent results were called by telephone at the time of interpretation on 04/29/2017 at 3:08 am to Dr. Lorretta Harp , who verbally acknowledged these results. Electronically Signed   By: Awilda Metro M.D.   On: 04/29/2017 03:10   Result Date: 04/29/2017 CLINICAL DATA:  Acute onset crushing central chest pain and shortness of breath. History of hypertension, hyperlipidemia and atherosclerosis. EXAM: CT ANGIOGRAPHY CHEST, ABDOMEN AND PELVIS TECHNIQUE: Multidetector CT imaging through the chest, abdomen and pelvis was performed using the standard protocol during bolus administration of intravenous contrast. Multiplanar reconstructed images and MIPs were obtained and reviewed to evaluate the vascular anatomy. CONTRAST:  48 cc Isovue 370 COMPARISON:  Chest radiograph April 28, 2017 FINDINGS:  CTA CHEST FINDINGS CARDIOVASCULAR: Thoracic aorta is normal course and caliber. No intrinsic density on noncontrast CT. Long segment descending thoracic periaortic intermediate density collection measuring to 6 mm (38 Hounsfield units). Homogeneous contrast opacification of thoracic aorta without dissection, or contrast extravasation. 8 mm laterally directed outpouching with subcentimeter  intermediate density nodule above the level of the coronary artery origins. Heart size is mildly enlarged. Heavily calcified mitral annulus. Moderate coronary artery calcifications, status post CABG. No pericardial effusion. MEDIASTINUM/NODES: No mediastinal mass or lymphadenopathy by CT size criteria. LUNGS/PLEURA: Tracheobronchial tree is patent, no pneumothorax. No pleural effusions, focal consolidations, pulmonary nodules or masses. Minimal lower lobe bronchiectasis. Dependent atelectasis. MUSCULOSKELETAL: Non-suspicious. Status post median sternotomy. Osteopenia and moderate degenerative change of thoracic spine. Review of the MIP images confirms the above findings. CTA ABDOMEN AND PELVIS FINDINGS VASCULAR Aorta: Abdominal aorta is normal in caliber, tortuous course with moderate calcific atherosclerosis. Homogeneous contrast opacification of aortoiliac vessels without dissection, aneurysm, luminal irregularity, periaortic fluid collections, or contrast extravasation. Celiac: Patent. SMA: Patent. Renals: Patent. IMA: Patent. Inflow: Negative. Veins: Negative, not tailored for evaluation. Review of the MIP images confirms the above findings. NON-VASCULAR HEPATOBILIARY: Subcentimeter gallstone without CT findings of acute cholecystitis. Negative liver. PANCREAS: Normal. SPLEEN: Normal. ADRENALS/URINARY TRACT: Kidneys are orthotopic, demonstrating symmetric enhancement. No nephrolithiasis, hydronephrosis or solid renal masses. Bilateral renal cysts measuring to 4.3 cm on the LEFT. Too small to characterize hypodensities kidneys bilaterally. The unopacified ureters are normal in course and caliber. Urinary bladder is well distended and unremarkable. Normal adrenal glands. STOMACH/BOWEL: The stomach, small and large bowel are normal in course and caliber without inflammatory changes, sensitivity decreased without oral contrast. Mild sigmoid colonic diverticulosis. Moderate amount of retained large bowel stool.  Normal appendix. VASCULAR/LYMPHATIC: No lymphadenopathy by CT size criteria. REPRODUCTIVE: Status post hysterectomy. OTHER: No intraperitoneal free fluid or free air. MUSCULOSKELETAL: Nonacute. Small fat containing umbilical hernia. Osteopenia. Mild old L2 and L3 compression fractures. Review of the MIP images confirms the above findings. IMPRESSION: CTA CHEST: 1. Subcentimeter ascending aortic outpouching concerning for partially thrombosed penetrating ulcer. 2. Long segment descending thoracic aorta intimal hematoma, potentially acute. 3. Mild cardiomegaly.  No acute pulmonary process. CTA ABDOMEN AND PELVIS: 1. No acute vascular process or or acute intra-abdominal/pelvic disease. 2. Cholelithiasis without acute cholecystitis. Aortic Atherosclerosis (ICD10-I70.0). Electronically Signed: By: Awilda Metroourtnay  Bloomer M.D. On: 04/29/2017 03:05    ASSESSMENT / PLAN:  Chest Pain with unclear etiology  CTA > Subcentimeter ascending aortic outpouching concerning for partially thrombosed penetrating ulcer. Long segment descending thoracic aorta intimal hematoma, potentially acute. Troponin <0.03  H/O Repaired Aortic Dissection in 2008 Plan  -CT Surgery Consulted  -Cardiac Monitoring  -PRN Morphine  -ECHO pending  -Hold ASA given thoracic aorta intimal hematoma  H/O COPD  Plan  -albuterol PRN   H/O HTN, HLD  Plan  -Continue Lipitor  -Continue Metoprolol -IV Hydralazine PRN   Diastolic HF (EF 60-65 with G2DD) BNP > 169.5  Plan  -Continue Lasix   DM  Plan  -Trend Glucose -SSI  -Hold metformin    Jovita KussmaulKatalina Erleen Egner, AGACNP-BC [facility]  Pgr: [phone]  PCCM Pgr: [phone]

## 2017-04-29 NOTE — Consult Note (Signed)
Name: [name] MRN: [id number] DOB: Nov 08, 1923    ADMISSION DATE:  04/28/2017 CONSULTATION DATE:  04/29/2017  REFERRING MD :  Dr. Clyde LundborgNiu   CHIEF COMPLAINT: Chest Pain   HISTORY OF PRESENT ILLNESS:   83 year old female with PMH of aortic dissection (repaired in 2008), HTN, HLD, DM, COPD, dCHF  Presents to ED on 11/2 with substernal chest pain in which radiates to the left scapula and is associated with shortness of breath. Negative Troponin. WBC 7.8, BNP 169.5, negative CXR, HD stable, CTA of aorta revealed sub centimeter ascending aortic outpouching concerning for partially thrombosed ulcer and long segment descending thoracic aorta intimal hematoma. CT Surgery Consulted. PCCM asked to monitor in ICU.   SIGNIFICANT EVENTS  11/2 > Presents to ED   STUDIES:  CXR 11/2 > Shallow inspiration with atelectatic changes. No focal consolidation CTA Chest 11/3 > . Subcentimeter ascending aortic outpouching concerning for partially thrombosed penetrating ulcer.Long segment descending thoracic aorta intimal hematoma, potentially acute. Mild cardiomegaly.  No acute pulmonary process. No acute vascular process or or acute intra-abdominal/pelvic disease. Cholelithiasis without acute cholecystitis. ECHO 11/3 >>>   SUBJECTIVE: Feels good this AM, no new complaints, hungry  VITAL SIGNS: Temp:  [97.8 F (36.6 C)-98.1 F (36.7 C)] 98.1 F (36.7 C) (11/03 0855) Pulse Rate:  [62-81] 67 (11/03 0915) Resp:  [11-30] 14 (11/03 0800) BP: (126-181)/(49-89) 145/60 (11/03 0915) SpO2:  [91 %-98 %] 97 % (11/03 0800)  PHYSICAL EXAMINATION: General:  Elderly female, no distress  Neuro:  Alert and oriented, moving all ext to command HEENT:  [place]/AT, PERRL, EOM-I and MMM Cardiovascular:  IRIR, HR 66, Nl S1/S2, -M/R/G. Lungs:  CTA bilaterally Abdomen:  Non-tender, active bowel sounds  Musculoskeletal:  -edema  Skin:  Warm, dry, intact    Recent Labs Lab 04/28/17 2308  NA 134*  K 3.5  CL 101  CO2 23    BUN 20  CREATININE 0.89  GLUCOSE 229*    Recent Labs Lab 04/28/17 2308  HGB 13.0  HCT 39.4  WBC 7.8  PLT 181   Dg Chest Port 1 View  Result Date: 04/28/2017 CLINICAL DATA:  83 year old female with chest pressure and shortness of breath. EXAM: PORTABLE CHEST 1 VIEW COMPARISON:  Chest radiograph dated 09/19/2016 FINDINGS: There is shallow inspiration. Left lung base atelectatic changes noted. No focal consolidation, pleural effusion, or pneumothorax. The cardiac silhouette is within normal limits. There is atherosclerotic calcification of the aortic arch. Median sternotomy wires and CABG vascular clips noted. No acute osseous pathology. IMPRESSION: Shallow inspiration with atelectatic changes. No focal consolidation. Electronically Signed   By: Elgie CollardArash  Radparvar M.D.   On: 04/28/2017 23:28   Ct Angio Chest/abd/pel For Dissection W And/or W/wo  Addendum Date: 04/29/2017   ADDENDUM REPORT: 04/29/2017 04:18 ADDENDUM: CT chest Nov 03, 2010 now submitted for comparison. The focal outpouching of the ascending aorta is relatively unchanged, and may be related to prior surgery. The descending thoracic intimal hematoma is new. Findings discussed with and reconfirmed by [facility]lick on 04/29/2017 at 4:15 am. Electronically Signed   By: Awilda Metroourtnay  Bloomer M.D.   On: 04/29/2017 04:18   Addendum Date: 04/29/2017   ADDENDUM REPORT: 04/29/2017 03:10 ADDENDUM: Critical Value/emergent results were called by telephone at the time of interpretation on 04/29/2017 at 3:08 am to Dr. Lorretta HarpXILIN NIU , who verbally acknowledged these results. Electronically Signed   By: Awilda Metroourtnay  Bloomer M.D.   On: 04/29/2017 03:10   Result Date: 04/29/2017 CLINICAL DATA:  Acute  onset crushing central chest pain and shortness of breath. History of hypertension, hyperlipidemia and atherosclerosis. EXAM: CT ANGIOGRAPHY CHEST, ABDOMEN AND PELVIS TECHNIQUE: Multidetector CT imaging through the chest, abdomen and pelvis was performed using the standard  protocol during bolus administration of intravenous contrast. Multiplanar reconstructed images and MIPs were obtained and reviewed to evaluate the vascular anatomy. CONTRAST:  48 cc Isovue 370 COMPARISON:  Chest radiograph April 28, 2017 FINDINGS: CTA CHEST FINDINGS CARDIOVASCULAR: Thoracic aorta is normal course and caliber. No intrinsic density on noncontrast CT. Long segment descending thoracic periaortic intermediate density collection measuring to 6 mm (38 Hounsfield units). Homogeneous contrast opacification of thoracic aorta without dissection, or contrast extravasation. 8 mm laterally directed outpouching with subcentimeter intermediate density nodule above the level of the coronary artery origins. Heart size is mildly enlarged. Heavily calcified mitral annulus. Moderate coronary artery calcifications, status post CABG. No pericardial effusion. MEDIASTINUM/NODES: No mediastinal mass or lymphadenopathy by CT size criteria. LUNGS/PLEURA: Tracheobronchial tree is patent, no pneumothorax. No pleural effusions, focal consolidations, pulmonary nodules or masses. Minimal lower lobe bronchiectasis. Dependent atelectasis. MUSCULOSKELETAL: Non-suspicious. Status post median sternotomy. Osteopenia and moderate degenerative change of thoracic spine. Review of the MIP images confirms the above findings. CTA ABDOMEN AND PELVIS FINDINGS VASCULAR Aorta: Abdominal aorta is normal in caliber, tortuous course with moderate calcific atherosclerosis. Homogeneous contrast opacification of aortoiliac vessels without dissection, aneurysm, luminal irregularity, periaortic fluid collections, or contrast extravasation. Celiac: Patent. SMA: Patent. Renals: Patent. IMA: Patent. Inflow: Negative. Veins: Negative, not tailored for evaluation. Review of the MIP images confirms the above findings. NON-VASCULAR HEPATOBILIARY: Subcentimeter gallstone without CT findings of acute cholecystitis. Negative liver. PANCREAS: Normal. SPLEEN:  Normal. ADRENALS/URINARY TRACT: Kidneys are orthotopic, demonstrating symmetric enhancement. No nephrolithiasis, hydronephrosis or solid renal masses. Bilateral renal cysts measuring to 4.3 cm on the LEFT. Too small to characterize hypodensities kidneys bilaterally. The unopacified ureters are normal in course and caliber. Urinary bladder is well distended and unremarkable. Normal adrenal glands. STOMACH/BOWEL: The stomach, small and large bowel are normal in course and caliber without inflammatory changes, sensitivity decreased without oral contrast. Mild sigmoid colonic diverticulosis. Moderate amount of retained large bowel stool. Normal appendix. VASCULAR/LYMPHATIC: No lymphadenopathy by CT size criteria. REPRODUCTIVE: Status post hysterectomy. OTHER: No intraperitoneal free fluid or free air. MUSCULOSKELETAL: Nonacute. Small fat containing umbilical hernia. Osteopenia. Mild old L2 and L3 compression fractures. Review of the MIP images confirms the above findings. IMPRESSION: CTA CHEST: 1. Subcentimeter ascending aortic outpouching concerning for partially thrombosed penetrating ulcer. 2. Long segment descending thoracic aorta intimal hematoma, potentially acute. 3. Mild cardiomegaly.  No acute pulmonary process. CTA ABDOMEN AND PELVIS: 1. No acute vascular process or or acute intra-abdominal/pelvic disease. 2. Cholelithiasis without acute cholecystitis. Aortic Atherosclerosis (ICD10-I70.0). Electronically Signed: By: Awilda Metro M.D. On: 04/29/2017 03:05   I reviewed CT of the chest myself, aneurysm noted.  ASSESSMENT / PLAN:  Chest Pain with unclear etiology  CTA > Subcentimeter ascending aortic outpouching concerning for partially thrombosed penetrating ulcer. Long segment descending thoracic aorta intimal hematoma, potentially acute. Troponin <0.03  H/O Repaired Aortic Dissection in 2008 Plan  - Vascular Surgery Consulted, no surgery for now but repeat CT in 48-72 hours - Cardiac Monitoring   - PRN Morphine  - ECHO pending  - Hold ASA given thoracic aorta intimal hematoma - Toprol XL 50 mg PO daily  H/O COPD  Plan  - Albuterol PRN   H/O HTN, HLD  Plan  - Continue Lipitor  - Continue Metoprolol - IV  Hydralazine PRN for BP control  Diastolic HF (EF 60-65 with G2DD) BNP > 169.5  Plan  - Continue Lasix   DM  Plan  - Trend Glucose - SSI  - Hold metformin  - Heart healthy carb modified diet  Hypokalemia: - K-dur 40 meq PO x1 - BMET in AM  Spoke with patient extensively, after discussion, she decided she does not want to have surgery regardless of surgical recommendations.  She also does not wish to be on life support or have CPR but would like to speak with her daughters first.  Maintain on bed rest for now.  IS and diet ordered.  Discussed with [facility]-MD and PCCM-NP  Transfer to SDU and to [facility] service with PCCM off 11/4.  The patient is critically ill with multiple organ systems failure and requires high complexity decision making for assessment and support, frequent evaluation and titration of therapies, application of advanced monitoring technologies and extensive interpretation of multiple databases.   Critical Care Time devoted to patient care services described in this note is  45  Minutes. This time reflects time of care of this signee Dr Koren Bound. This critical care time does not reflect procedure time, or teaching time or supervisory time of PA/NP/Med student/Med Resident etc but could involve care discussion time.  Alyson Reedy, M.D. [facility] Pulmonary/Critical Care Medicine. Pager: [phone]. After hours pager: [phone].

## 2017-04-29 NOTE — Consult Note (Signed)
[address]       [place] [id number]             [phone]        Nadara Modelsie B [facility] Medical Record #[id number] Date of Birth: 06/14/1924  Referring: Dr. Clyde LundborgNiu, MD Primary Care: Lupita RaiderShaw, Kimberlee, MD  Chief Complaint:    Chief Complaint  Patient presents with  . Chest Pain    History of Present Illness:     This is a 83 year old active Caucasian female who is known to [facility] as she was found to have a 5.8 cm fusiform ascending aortic aneurysm extending into the aortic arch on 10/10/2006. She underwent a resection and grafting of ascending aortic fusiform aneurysm, 5.8 cm, with replacement of the ascending aorta and hemiarch replacement using a 28 mm Hemashield Dacron graft (serial number [id number]) by Dr. Donata ClayVan Trigt.  She continued to be followed by Dr. Donata ClayVan Trigt. Her last CT angio of the chest was in May 2012. Results showed stable mural atheromatous change with slight dilatation ascending thoracic aortic arch measures 3.7 cm with a descending thoracic ortic maximum diameter 3.1 cm with normal caliber visualized abdominal aorta.   [facility] with complaints of chest heaviness/crushing feeling last evening. Her symptoms started after 10 pm last evening, was located substernal, and described as crushing and pressure like. The pain radiated to her left posterior shoulder and she also had some shortness of breath and dizziness. Chest x ray showed left lung atelectasis, bo pleural effusion or pneumothorax, and atherosclerotic calcification of the aortic arch. CTA of the chest was done and showed the following: subcentimeter ascending aortic outpouching concerning for partially thrombosed penetrating ulcer, long segment descending thoracic aorta intimal hematoma, potentially acute, and mild cardiomegaly. No acute pulmonary process. CTA ABDOMEN AND PELVIS showed:no acute vascular process or or acute intra-abdominal/pelvic Disease, cholelithiasis without  acute cholecystitis, and aortic atherosclerosis Dr. Randie Heinzain with vascular surgery was consulted as well as Dr. Cornelius Moraswen for further evaluation and management. Currently, patient describes soreness mid abdomen/chest. She denies shortness of breath.  Current Activity/ Functional Status: Patient is independent with mobility/ambulation, transfers, ADL's, IADL's.   Zubrod Score: At the time of surgery this patient's most appropriate activity status/level should be described as: []     0    Normal activity, no symptoms [x]     1    Restricted in physical strenuous activity but ambulatory, able to do out light work []     2    Ambulatory and capable of self care, unable to do work activities, up and about more than 50%  Of the time                            []     3    Only limited self care, in bed greater than 50% of waking hours []     4    Completely disabled, no self care, confined to bed or chair []     5    Moribund  Past Medical History:  Diagnosis Date  . Aortic atherosclerosis (HCC)    on US 05/13/13  . Aortic dissection (HCC)    Dr. Morton PetersVan Tright  . Chronic cystitis    Dr. Sherron MondayMacdiarmid  . COPD (chronic obstructive pulmonary disease) (HCC)   . Diabetes mellitus without complication (HCC)   . Hyperlipidemia   . Hypertension   . Osteoporosis  giant cell arteritis, Dr. Kellie Simmering    Past Surgical History:  Procedure Laterality Date  . Status post resection of a large ascending thoracic aortic aneurysm with a Hemashield graft hemiarch reconstruction and preservation of  the aortic valve, October 10, 2006.     Left knee operative arthroscopy with partial medial and lateral      meniscectomies by Dr. Renae Fickle Oct 2009   1. Nail plate removal left ring finger.  2. Nail bed repair, complex in nature, left ring finger.  3. I&D/excisional debridement open fracture left ring finger.  4. ORIF left ring finger distal phalanx.  5. Complex soft tissue repair, left ring finger secondary to near      amputation  by Dr. Amanda Pea June 2008.  Social History   Social History  . Marital status: Widowed    Spouse name: N/A  . Number of children: N/A  . Years of education: N/A   Occupational History  . Worked at [employer] many years ago   Social History Main Topics  . Smoking status: Never Smoker  . Smokeless tobacco: Never Used  . Alcohol use No  . Drug use: No  . Sexual activity: Not on file    Allergies  Allergen Reactions  . Actonel [Risedronate Sodium] Nausea And Vomiting  . Codeine     Unknown   . Lipitor [Atorvastatin] Nausea And Vomiting and Other (See Comments)    Can only take low doses of med   . Macrobid [Nitrofurantoin Monohyd Macro] Swelling and Other (See Comments)    Headaches  . Septra [Sulfamethoxazole-Trimethoprim] Rash    Current Facility-Administered Medications  Medication Dose Route Frequency Provider Last Rate Last Dose  . 0.9 %  sodium chloride infusion  250 mL Intravenous PRN Tobey Grim, NP      . acetaminophen (TYLENOL) tablet 650 mg  650 mg Oral Q4H PRN Lorretta Harp, MD      . albuterol (PROVENTIL) (2.5 MG/3ML) 0.083% nebulizer solution 2.5 mg  2.5 mg Nebulization Q6H PRN Lorretta Harp, MD      . atorvastatin (LIPITOR) tablet 20 mg  20 mg Oral Daily Lorretta Harp, MD   20 mg at 04/29/17 0916  . calcium carbonate (OS-CAL - dosed in mg of elemental calcium) tablet 1,250 mg  1,250 mg Oral Daily Lorretta Harp, MD   1,250 mg at 04/29/17 0916  . cholecalciferol (VITAMIN D) tablet 2,000 Units  2,000 Units Oral Daily Lorretta Harp, MD   2,000 Units at 04/29/17 2130  . furosemide (LASIX) tablet 20 mg  20 mg Oral Daily Lorretta Harp, MD   20 mg at 04/29/17 0915  . hydrALAZINE (APRESOLINE) injection 5 mg  5 mg Intravenous Q2H PRN Lorretta Harp, MD      . insulin aspart (novoLOG) injection 2-6 Units  2-6 Units Subcutaneous Q4H Tobey Grim, NP   4 Units at 04/29/17 0857  . metoprolol succinate (TOPROL-XL) 24 hr tablet 50 mg  50 mg Oral Daily Lorretta Harp, MD   50 mg at 04/29/17  0917  . morphine 4 MG/ML injection 1 mg  1 mg Intravenous Q2H PRN Tobey Grim, NP      . multivitamin with minerals tablet 1 tablet  1 tablet Oral Daily Lorretta Harp, MD   1 tablet at 04/29/17 0916  . omega-3 acid ethyl esters (LOVAZA) capsule 1 g  1 g Oral Daily Lorretta Harp, MD   1 g at 04/29/17 0915  . ondansetron (ZOFRAN) injection 4 mg  4 mg Intravenous  Q6H PRN Lorretta Harp, MD      . potassium chloride SA (K-DUR,KLOR-CON) CR tablet 40 mEq  40 mEq Oral Once Alyson Reedy, MD      . trimethoprim (TRIMPEX) tablet 100 mg  100 mg Oral Daily Lorretta Harp, MD   100 mg at 04/29/17 1610    Prescriptions Prior to Admission  Medication Sig Dispense Refill Last Dose  . aspirin 81 MG tablet Take 81 mg by mouth every morning.    04/28/2017 at Unknown time  . atorvastatin (LIPITOR) 20 MG tablet Take 20 mg by mouth every morning.    04/28/2017 at Unknown time  . calcium carbonate 200 MG capsule Take 2,000 mg by mouth every morning.    04/28/2017 at Unknown time  . Cholecalciferol (VITAMIN D) 2000 UNITS tablet Take 2,000 Units by mouth every morning.    04/28/2017 at Unknown time  . furosemide (LASIX) 20 MG tablet Take 20 mg by mouth every morning.    04/28/2017 at Unknown time  . metFORMIN (GLUCOPHAGE) 500 MG tablet Take 500 mg by mouth 2 (two) times daily.   04/28/2017 at Unknown time  . metoprolol succinate (TOPROL-XL) 50 MG 24 hr tablet Take 50 mg by mouth daily. Take with or immediately following a meal.   04/28/2017 at 0800  . Multiple Vitamins-Minerals (MULTIVITAMIN PO) Take 1 tablet by mouth every morning.    04/28/2017 at Unknown time  . Omega-3 Fatty Acids (FISH OIL) 1000 MG CAPS Take 1,000 mg by mouth daily.    04/28/2017 at Unknown time  . potassium chloride (K-DUR) 10 MEQ tablet Take 1 tablet (10 mEq total) by mouth daily. 30 tablet 0 04/28/2017 at Unknown time  . trimethoprim (TRIMPEX) 100 MG tablet Take 100 mg by mouth daily.   04/28/2017 at Unknown time    Family History  Problem Relation Age of  Onset  . Heart attack Father   . Bone cancer Sister    Review of Systems:    Cardiac Review of Systems: Y or N  Chest Pain [  Y  ]  Resting SOB [ N  ] Exertional SOB  [ N ]  Orthopnea Klaus.Mock  ] Pedal Edema [ N  ]    Palpitations [ N ] Syncope  Klaus.Mock  ]   Presyncope [ N  ]  General Review of Systems: [Y] = yes [ N ]=no Constitional: nausea [ N ]; night sweats Klaus.Mock ]; fever [ N ]; or chills [ N ]                                                    Eye : diplopia [  N ];  Resp: cough [ N ];  wheezing[ N ];  hemoptysis[ N ];   GI:  vomiting[ N ];  dysphagia[ N ]; melena[N  ];  hematochezia [ N ];  GU:  hematuria[N  ];   dysuria [  N];               Skin: rash, swelling[ N ];,  peripheral edema[N  ];   Heme/Lymph:  bleeding[ N ];  anemia[N  ];  Neuro: Deyanira.Kussmaul  ];  Colt.Areas  ];  stroke[ N ];  vertigo[N  ]; Psych:depression[ N ]; anxiety[ N ];  Endocrine: diabetes[Y  ];  thyroid dysfunction[ N ];  Physical Exam: BP (!) 145/60   Pulse 67   Temp 98.1 F (36.7 C) (Oral)   Resp 14   SpO2 97%   General appearance: alert, cooperative and no distress Head: Normocephalic, without obvious abnormality, atraumatic Neck: no JVD and supple, symmetrical, trachea midline Resp: clear to auscultation bilaterally Cardio: RRR, no murmur GI: Soft, bowel sounds present, non tender Extremities: Feet warm bilaterally, no LE edema Neurologic: Grossly normal  Diagnostic Studies & Laboratory data:     Recent Radiology Findings:   Dg Chest Port 1 View  Result Date: 04/28/2017 CLINICAL DATA:  83 year old female with chest pressure and shortness of breath. EXAM: PORTABLE CHEST 1 VIEW COMPARISON:  Chest radiograph dated 09/19/2016 FINDINGS: There is shallow inspiration. Left lung base atelectatic changes noted. No focal consolidation, pleural effusion, or pneumothorax. The cardiac silhouette is within normal limits. There is atherosclerotic calcification of the aortic arch. Median sternotomy wires and CABG vascular  clips noted. No acute osseous pathology. IMPRESSION: Shallow inspiration with atelectatic changes. No focal consolidation. Electronically Signed   By: Elgie Collard M.D.   On: 04/28/2017 23:28   Ct Angio Chest/abd/pel For Dissection W And/or W/wo  Addendum Date: 04/29/2017   ADDENDUM REPORT: 04/29/2017 04:18 ADDENDUM: CT chest Nov 03, 2010 now submitted for comparison. The focal outpouching of the ascending aorta is relatively unchanged, and may be related to prior surgery. The descending thoracic intimal hematoma is new. Findings discussed with and reconfirmed by [facility] on 04/29/2017 at 4:15 am. Electronically Signed   By: Awilda Metro M.D.   On: 04/29/2017 04:18   Addendum Date: 04/29/2017   ADDENDUM REPORT: 04/29/2017 03:10 ADDENDUM: Critical Value/emergent results were called by telephone at the time of interpretation on 04/29/2017 at 3:08 am to Dr. Lorretta Harp , who verbally acknowledged these results. Electronically Signed   By: Awilda Metro M.D.   On: 04/29/2017 03:10   Result Date: 04/29/2017 CLINICAL DATA:  Acute onset crushing central chest pain and shortness of breath. History of hypertension, hyperlipidemia and atherosclerosis. EXAM: CT ANGIOGRAPHY CHEST, ABDOMEN AND PELVIS TECHNIQUE: Multidetector CT imaging through the chest, abdomen and pelvis was performed using the standard protocol during bolus administration of intravenous contrast. Multiplanar reconstructed images and MIPs were obtained and reviewed to evaluate the vascular anatomy. CONTRAST:  48 cc Isovue 370 COMPARISON:  Chest radiograph April 28, 2017 FINDINGS: CTA CHEST FINDINGS CARDIOVASCULAR: Thoracic aorta is normal course and caliber. No intrinsic density on noncontrast CT. Long segment descending thoracic periaortic intermediate density collection measuring to 6 mm (38 Hounsfield units). Homogeneous contrast opacification of thoracic aorta without dissection, or contrast extravasation. 8 mm laterally directed  outpouching with subcentimeter intermediate density nodule above the level of the coronary artery origins. Heart size is mildly enlarged. Heavily calcified mitral annulus. Moderate coronary artery calcifications, status post CABG. No pericardial effusion. MEDIASTINUM/NODES: No mediastinal mass or lymphadenopathy by CT size criteria. LUNGS/PLEURA: Tracheobronchial tree is patent, no pneumothorax. No pleural effusions, focal consolidations, pulmonary nodules or masses. Minimal lower lobe bronchiectasis. Dependent atelectasis. MUSCULOSKELETAL: Non-suspicious. Status post median sternotomy. Osteopenia and moderate degenerative change of thoracic spine. Review of the MIP images confirms the above findings. CTA ABDOMEN AND PELVIS FINDINGS VASCULAR Aorta: Abdominal aorta is normal in caliber, tortuous course with moderate calcific atherosclerosis. Homogeneous contrast opacification of aortoiliac vessels without dissection, aneurysm, luminal irregularity, periaortic fluid collections, or contrast extravasation. Celiac: Patent. SMA: Patent. Renals: Patent. IMA: Patent. Inflow: Negative. Veins: Negative, not tailored for evaluation. Review of the MIP images confirms the above  findings. NON-VASCULAR HEPATOBILIARY: Subcentimeter gallstone without CT findings of acute cholecystitis. Negative liver. PANCREAS: Normal. SPLEEN: Normal. ADRENALS/URINARY TRACT: Kidneys are orthotopic, demonstrating symmetric enhancement. No nephrolithiasis, hydronephrosis or solid renal masses. Bilateral renal cysts measuring to 4.3 cm on the LEFT. Too small to characterize hypodensities kidneys bilaterally. The unopacified ureters are normal in course and caliber. Urinary bladder is well distended and unremarkable. Normal adrenal glands. STOMACH/BOWEL: The stomach, small and large bowel are normal in course and caliber without inflammatory changes, sensitivity decreased without oral contrast. Mild sigmoid colonic diverticulosis. Moderate amount of  retained large bowel stool. Normal appendix. VASCULAR/LYMPHATIC: No lymphadenopathy by CT size criteria. REPRODUCTIVE: Status post hysterectomy. OTHER: No intraperitoneal free fluid or free air. MUSCULOSKELETAL: Nonacute. Small fat containing umbilical hernia. Osteopenia. Mild old L2 and L3 compression fractures. Review of the MIP images confirms the above findings. IMPRESSION: CTA CHEST: 1. Subcentimeter ascending aortic outpouching concerning for partially thrombosed penetrating ulcer. 2. Long segment descending thoracic aorta intimal hematoma, potentially acute. 3. Mild cardiomegaly.  No acute pulmonary process. CTA ABDOMEN AND PELVIS: 1. No acute vascular process or or acute intra-abdominal/pelvic disease. 2. Cholelithiasis without acute cholecystitis. Aortic Atherosclerosis (ICD10-I70.0). Electronically Signed: By: Awilda Metro M.D. On: 04/29/2017 03:05     I have independently reviewed the above radiologic studies.  Recent Lab Findings: Lab Results  Component Value Date   WBC 7.8 04/28/2017   HGB 13.0 04/28/2017   HCT 39.4 04/28/2017   PLT 181 04/28/2017   GLUCOSE 229 (H) 04/28/2017   CHOL 198 04/29/2017   TRIG 123 04/29/2017   HDL 49 04/29/2017   LDLCALC 124 (H) 04/29/2017   ALT 18 04/19/2009   AST 20 04/19/2009   NA 134 (L) 04/28/2017   K 3.5 04/28/2017   CL 101 04/28/2017   CREATININE 0.89 04/28/2017   BUN 20 04/28/2017   CO2 23 04/28/2017   TSH 3.519 Test methodology is 3rd generation TSH 04/18/2009   INR 0.89 04/18/2009   HGBA1C 8.0 (H) 04/29/2017   Assessment / Plan:      1. S/p repair resection and grafting of ascending aortic fusiform aneurysm, 5.8 cm, with replacement of the ascending aorta and hemiarch replacement using a 28 mm Hemashield Dacron graft on April 15th, 2008. 2. History fusiform abdominal aortic aneurysm,descending thoracic IMH-Dr. Randie Heinz following 3. 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  Hypertension-on Toprol XL 50 mg daily prior to admission. Needs to have strict BP control. 5. Hyperlipidemia-on Atorvastatin 20 mg at hs prior to admission 6. Diabetes Mellitus-on Metformin 500 mg bid prior to admission. HGA1C 8. Will need follow up with her medical doctor after discharge.   Doree Fudge PA-C 04/29/2017 10:20 AM  I have seen and examined the patient and agree with the assessment and plan as outlined.  Patient is a 83yo female with h/o hypertension, diabetes and COPD who underwent resection and grafting of ascending aortic aneurysm w/ hemiarch distal aortic reconstruction by Dr Donata Clay in 2008 for 5.8 cm ascending aortic aneurysm associated with giant cell aortitis and now presents with sudden onset chest pain radiating to the back and new IMH involving the descending thoracic aorta.  I have personally reviewed the CT angiogram performed last night and reviewed it with the last previous scan from 2012.  The "focal outpouching" from the ascending aortic graft is chronic, stable and likely represents the site of cannulation of the graft from the patient's original surgery.  There are no complicating features of the  IMH involving the descending aorta.  I would favor conservative management and question whether or not follow up imaging is even necessary.  I would not consider the patient a candidate for any type of surgical intervention regardless of the circumstances.  Strict control of blood pressure should be the focus of her care with beta blockers the best choice if the patient can tolerate them.  Discussed goals of care with the patient and her daughter at the bedside.  Please call if we can be of further assistance.  I will let Dr Donata Clay know of the patient's admission.  I spent in excess of 30 minutes during the conduct of this hospital encounter and >50% of this time involved direct face-to-face encounter with the patient for counseling and/or coordination of their  care.    Purcell Nails, MD 04/29/2017 1:36 PM

## 2017-04-29 NOTE — Consult Note (Signed)
ED Consult    Reason for Consult:  Type B dissection Referring Physician:  Clyde Lundborg MRN #:  [id number]  History of Present Illness:  83 y.o. female with history of ascending aortic aneurysm that has been resected and repaired with dacron graft. Last night she developed crushing chest pain radiating to her back and associated with shortness of breath. She was concerned for heart attack and called 911. She was found to have high blood pressure with systolic 200 and per family has probably not been taking medications as prescribed. She now is in ED without significant pain and no further shortness of breath. Her abdomen does not hurt and her legs are fine. She actually has no complaints at this time.   Past Medical History:  Diagnosis Date  . Aortic atherosclerosis (HCC)    on [place] 05/13/13  . Aortic dissection (HCC)    Dr. Morton Peters  . Chronic cystitis    Dr. Sherron Monday  . COPD (chronic obstructive pulmonary disease) (HCC)   . Diabetes mellitus without complication (HCC)   . Hyperlipidemia   . Hypertension   . Osteoporosis    giant cell arteritis, Dr. Kellie Simmering    Past Surgical History:  Procedure Laterality Date  . aortic acrh surgery      Allergies  Allergen Reactions  . Actonel [Risedronate Sodium] Nausea And Vomiting  . Codeine     Unknown   . Lipitor [Atorvastatin] Nausea And Vomiting and Other (See Comments)    Can only take low doses of med   . Macrobid [Nitrofurantoin Monohyd Macro] Swelling and Other (See Comments)    Headaches  . Septra [Sulfamethoxazole-Trimethoprim] Rash    Prior to Admission medications   Medication Sig Start Date End Date Taking? Authorizing Provider  aspirin 81 MG tablet Take 81 mg by mouth every morning.    Yes [provider]  atorvastatin (LIPITOR) 20 MG tablet Take 20 mg by mouth every morning.    Yes [provider]  calcium carbonate 200 MG capsule Take 2,000 mg by mouth every morning.    Yes [provider]    Cholecalciferol (VITAMIN D) 2000 UNITS tablet Take 2,000 Units by mouth every morning.    Yes [provider]  furosemide (LASIX) 20 MG tablet Take 20 mg by mouth every morning.    Yes [provider]  metFORMIN (GLUCOPHAGE) 500 MG tablet Take 500 mg by mouth 2 (two) times daily. 08/29/16  Yes [provider]  metoprolol succinate (TOPROL-XL) 50 MG 24 hr tablet Take 50 mg by mouth daily. Take with or immediately following a meal.   Yes [provider]  Multiple Vitamins-Minerals (MULTIVITAMIN PO) Take 1 tablet by mouth every morning.    Yes [provider]  Omega-3 Fatty Acids (FISH OIL) 1000 MG CAPS Take 1,000 mg by mouth daily.    Yes [provider]  potassium chloride (K-DUR) 10 MEQ tablet Take 1 tablet (10 mEq total) by mouth daily. 09/23/16  Yes Arrien, York Ram, MD  trimethoprim (TRIMPEX) 100 MG tablet Take 100 mg by mouth daily.   Yes [provider]    Social History   Social History  . Marital status: Widowed    Spouse name: N/A  . Number of children: N/A  . Years of education: N/A   Occupational History  . Not on file.   Social History Main Topics  . Smoking status: Never Smoker  . Smokeless tobacco: Never Used  . Alcohol use No  . Drug  use: No  . Sexual activity: Not on file   Other Topics Concern  . Not on file   Social History Narrative  . No narrative on file     Family History  Problem Relation Age of Onset  . Heart attack Father   . Bone cancer Sister     REVIEW OF SYSTEMS (negative unless checked):   Cardiac:  [x]  Chest pain or chest pressure? [x]  Shortness of breath upon activity? [x]  Shortness of breath when lying flat? []  Irregular heart rhythm?  Vascular:  []  Pain in calf, thigh, or hip brought on by walking? []  Pain in feet at night that wakes you up from your sleep? []  Blood clot in your veins? [x]  Leg swelling?  Pulmonary:  []  Oxygen at home? []  Productive cough? []   Wheezing?  Neurologic:  []  Sudden weakness in arms or legs? []  Sudden numbness in arms or legs? []  Sudden onset of difficult speaking or slurred speech? []  Temporary loss of vision in one eye? []  Problems with dizziness?  Gastrointestinal:  []  Blood in stool? []  Vomited blood?  Genitourinary:  []  Burning when urinating? []  Blood in urine?  Psychiatric:  []  Major depression  Hematologic:  []  Bleeding problems? []  Problems with blood clotting?  Dermatologic:  []  Rashes or ulcers?  Constitutional:  []  Fever or chills?  Ear/Nose/Throat:  []  Change in hearing? []  Nose bleeds? []  Sore throat?  Musculoskeletal:  []  Back pain? []  Joint pain? []  Muscle pain?    Physical Examination  Vitals:   04/29/17 0700 04/29/17 0800  BP: (!) 181/72 (!) 160/81  Pulse: 75 71  Resp: 16 14  Temp:    SpO2: 97% 97%   There is no height or weight on file to calculate BMI.  General:  WDWN in NAD HENT: WNL, normocephalic Pulmonary: normal non-labored breathing, without Rales, rhonchi,  wheezing Cardiac: 2+ femoral pulses Abdomen: soft, NT/ND, no masses Skin: without rashes Extremities: without ischemic changes, without Gangrene , without cellulitis; without open wounds;  Musculoskeletal: no muscle wasting or atrophy  Neurologic: A&O X 3; Appropriate Affect ; SENSATION: normal; MOTOR FUNCTION:  moving all extremities equally. Speech is fluent/normal Psychiatric:  Appropriate mood and affect  CBC    Component Value Date/Time   WBC 7.8 04/28/2017 2308   RBC 3.90 04/28/2017 2308   HGB 13.0 04/28/2017 2308   HCT 39.4 04/28/2017 2308   PLT 181 04/28/2017 2308   MCV 101.0 (H) 04/28/2017 2308   MCH 33.3 04/28/2017 2308   MCHC 33.0 04/28/2017 2308   RDW 12.8 04/28/2017 2308   LYMPHSABS 3.0 04/28/2017 2308   MONOABS 0.7 04/28/2017 2308   EOSABS 0.2 04/28/2017 2308   BASOSABS 0.0 04/28/2017 2308    BMET    Component Value Date/Time   NA 134 (L) 04/28/2017 2308   K 3.5  04/28/2017 2308   CL 101 04/28/2017 2308   CO2 23 04/28/2017 2308   GLUCOSE 229 (H) 04/28/2017 2308   BUN 20 04/28/2017 2308   CREATININE 0.89 04/28/2017 2308   CALCIUM 8.6 (L) 04/28/2017 2308   GFRNONAA 55 (L) 04/28/2017 2308   GFRAA >60 04/28/2017 2308    COAGS: Lab Results  Component Value Date   INR 0.89 04/18/2009   INR 1.0 12/12/2006     CT:  IMPRESSION: CTA CHEST:  1. Subcentimeter ascending aortic outpouching concerning for partially thrombosed penetrating ulcer. 2. Long segment descending thoracic aorta intimal hematoma, potentially acute. 3. Mild cardiomegaly.  No acute pulmonary process. CTA ABDOMEN  AND PELVIS:  1. No acute vascular process or or acute intra-abdominal/pelvic disease. 2. Cholelithiasis without acute cholecystitis. Aortic Atherosclerosis (ICD10-I70.0).   ASSESSMENT/PLAN: This is a 83 y.o. female with descending thoracic IMH. She needs bp control and pain management. Will plan to re-scan in 48 or 72 hours as long as no evidence of malperfusion which she does not have at this time. Agree with goals of care discussion.   Camya Haydon C. Randie Heinz, MD [facility] Office: [phone] Pager: [phone]

## 2017-04-29 NOTE — H&P (Addendum)
History and Physical    [name] ZOX:[id number] DOB: [birth year] DOA: 04/28/2017  Referring MD/NP/PA:   PCP: Lupita Raider, MD   Patient coming from:  The patient is coming from home.  At baseline, pt is independent for most of ADL.   Chief Complaint: chest pain  HPI: [name] is a 83 y.o. female with medical history significant of repaired aortic dissection (approximately 9-11 years ago per her daughter), hypertension, hyperlipidemia, diabetes mellitus, COPD, dCHF, who presents with chest pain.  Patient states that her chest pain started suddenly at about 10:20 p.m. It is located in the substernal area, 9 out of 10 in severity, crushing and pressure-like pain, radiating to the left scapula. It is associated with mild SOB. The shortness breath has resolved currently. Patient does not have cough, fever or chills. Denies tenderness in calf area. Patient does not have nausea, vomiting, diarrhea or abdominal pain. No symptoms of UTI or unilateral weakness.  ED Course: pt was found to have negative troponin, WBC 7.8, BNP 169.5, electrolytes and renal function okay, negative chest x-ray, temperature normal, tachycardia, oxygen saturation 92% on room air. Patient is admitted to telemetry bed as inpatient. Pending CT angiogram of aorta  Review of Systems:   General: no fevers, chills, no body weight gain, has fatigue HEENT: no blurry vision, hearing changes or sore throat Respiratory: has dyspnea, no coughing, wheezing CV: has chest pain, no palpitations GI: no nausea, vomiting, abdominal pain, diarrhea, constipation GU: no dysuria, burning on urination, increased urinary frequency, hematuria  Ext: has trace leg edema Neuro: no unilateral weakness, numbness, or tingling, no vision change or hearing loss Skin: no rash, no skin tear. MSK: No muscle spasm, no deformity, no limitation of range of movement in spin Heme: No easy bruising.  Travel history: No recent long distant  travel.  Allergy:  Allergies  Allergen Reactions  . Actonel [Risedronate Sodium] Nausea And Vomiting  . Codeine     Unknown   . Lipitor [Atorvastatin] Nausea And Vomiting and Other (See Comments)    Can only take low doses of med   . Macrobid [Nitrofurantoin Monohyd Macro] Swelling and Other (See Comments)    Headaches  . Septra [Sulfamethoxazole-Trimethoprim] Rash    Past Medical History:  Diagnosis Date  . Aortic atherosclerosis (HCC)    on [place] 05/13/13  . Aortic dissection (HCC)    Dr. Morton Peters  . Chronic cystitis    Dr. Sherron Monday  . COPD (chronic obstructive pulmonary disease) (HCC)   . Diabetes mellitus without complication (HCC)   . Hyperlipidemia   . Hypertension   . Osteoporosis    giant cell arteritis, Dr. Kellie Simmering    Past Surgical History:  Procedure Laterality Date  . aortic acrh surgery      Social History:  reports that she has never smoked. She has never used smokeless tobacco. She reports that she does not drink alcohol or use drugs.  Family History:  Family History  Problem Relation Age of Onset  . Heart attack Father   . Bone cancer Sister      Prior to Admission medications   Medication Sig Start Date End Date Taking? Authorizing Provider  aspirin 81 MG tablet Take 81 mg by mouth every morning.    Yes [provider]  atorvastatin (LIPITOR) 20 MG tablet Take 20 mg by mouth every morning.    Yes [provider]  calcium carbonate 200 MG capsule Take 2,000 mg by mouth every morning.  Yes [provider]  Cholecalciferol (VITAMIN D) 2000 UNITS tablet Take 2,000 Units by mouth every morning.    Yes [provider]  furosemide (LASIX) 20 MG tablet Take 20 mg by mouth every morning.    Yes [provider]  metFORMIN (GLUCOPHAGE) 500 MG tablet Take 500 mg by mouth 2 (two) times daily. 08/29/16  Yes [provider]  metoprolol succinate (TOPROL-XL) 50 MG 24 hr tablet Take 50 mg by mouth daily. Take  with or immediately following a meal.   Yes [provider]  Multiple Vitamins-Minerals (MULTIVITAMIN PO) Take 1 tablet by mouth every morning.    Yes [provider]  Omega-3 Fatty Acids (FISH OIL) 1000 MG CAPS Take 1,000 mg by mouth daily.    Yes [provider]  potassium chloride (K-DUR) 10 MEQ tablet Take 1 tablet (10 mEq total) by mouth daily. 09/23/16  Yes Arrien, York Ram, MD  trimethoprim (TRIMPEX) 100 MG tablet Take 100 mg by mouth daily.   Yes [provider]    Physical Exam: Vitals:   04/29/17 0300 04/29/17 0330 04/29/17 0500 04/29/17 0530  BP: 137/70 131/73 131/62 (!) 134/55  Pulse: 70 66 74 81  Resp: 15 (!) 21 17 (!) 30  Temp:      TempSrc:      SpO2: 92% 92% 93% 93%   General: Not in acute distress HEENT:       Eyes: PERRL, EOMI, no scleral icterus.       ENT: No discharge from the ears and nose, no pharynx injection, no tonsillar enlargement.        Neck: No JVD, no bruit, no mass felt. Heme: No neck lymph node enlargement. Cardiac: S1/S2, RRR, No murmurs, No gallops or rubs. Respiratory: No rales, wheezing, rhonchi or rubs. GI: Soft, nondistended, nontender, no rebound pain, no organomegaly, BS present. GU: No hematuria Ext: trace pitting leg edema bilaterally. 2+DP/PT pulse bilaterally. Musculoskeletal: No joint deformities, No joint redness or warmth, no limitation of ROM in spin. Skin: No rashes.  Neuro: Alert, oriented X3, cranial nerves II-XII grossly intact, moves all extremities normally.  Psych: Patient is not psychotic, no suicidal or hemocidal ideation.  Labs on Admission: I have personally reviewed following labs and imaging studies  CBC:  Recent Labs Lab 04/28/17 2308  WBC 7.8  NEUTROABS 3.8  HGB 13.0  HCT 39.4  MCV 101.0*  PLT 181   Basic Metabolic Panel:  Recent Labs Lab 04/28/17 2308  NA 134*  K 3.5  CL 101  CO2 23  GLUCOSE 229*  BUN 20  CREATININE 0.89  CALCIUM 8.6*   GFR: CrCl  cannot be calculated (Unknown ideal weight.). Liver Function Tests: No results for input(s): AST, ALT, ALKPHOS, BILITOT, PROT, ALBUMIN in the last 168 hours. No results for input(s): LIPASE, AMYLASE in the last 168 hours. No results for input(s): AMMONIA in the last 168 hours. Coagulation Profile: No results for input(s): INR, PROTIME in the last 168 hours. Cardiac Enzymes:  Recent Labs Lab 04/29/17 0140  TROPONINI <0.03   BNP (last 3 results) No results for input(s): PROBNP in the last 8760 hours. HbA1C: No results for input(s): HGBA1C in the last 72 hours. CBG:  Recent Labs Lab 04/29/17 0611  GLUCAP 193*   Lipid Profile:  Recent Labs  04/29/17 0457  CHOL 198  HDL 49  LDLCALC 124*  TRIG 123  CHOLHDL 4.0   Thyroid Function Tests: No results for input(s): TSH, T4TOTAL, FREET4, T3FREE, THYROIDAB in  the last 72 hours. Anemia Panel: No results for input(s): VITAMINB12, FOLATE, FERRITIN, TIBC, IRON, RETICCTPCT in the last 72 hours. Urine analysis:    Component Value Date/Time   COLORURINE AMBER (A) 09/20/2016 0017   APPEARANCEUR TURBID (A) 09/20/2016 0017   LABSPEC 1.014 09/20/2016 0017   PHURINE 5.0 09/20/2016 0017   GLUCOSEU NEGATIVE 09/20/2016 0017   HGBUR SMALL (A) 09/20/2016 0017   BILIRUBINUR NEGATIVE 09/20/2016 0017   KETONESUR 20 (A) 09/20/2016 0017   PROTEINUR 100 (A) 09/20/2016 0017   UROBILINOGEN 0.2 04/18/2009 0212   NITRITE NEGATIVE 09/20/2016 0017   LEUKOCYTESUR LARGE (A) 09/20/2016 0017   Sepsis Labs: @LABRCNTIP (procalcitonin:4,lacticidven:4) )No results found for this or any previous visit (from the past 240 hour(s)).   Radiological Exams on Admission: Dg Chest Port 1 View  Result Date: 04/28/2017 CLINICAL DATA:  83 year old female with chest pressure and shortness of breath. EXAM: PORTABLE CHEST 1 VIEW COMPARISON:  Chest radiograph dated 09/19/2016 FINDINGS: There is shallow inspiration. Left lung base atelectatic changes noted. No focal  consolidation, pleural effusion, or pneumothorax. The cardiac silhouette is within normal limits. There is atherosclerotic calcification of the aortic arch. Median sternotomy wires and CABG vascular clips noted. No acute osseous pathology. IMPRESSION: Shallow inspiration with atelectatic changes. No focal consolidation. Electronically Signed   By: Elgie Collard M.D.   On: 04/28/2017 23:28   Ct Angio Chest/abd/pel For Dissection W And/or W/wo  Addendum Date: 04/29/2017   ADDENDUM REPORT: 04/29/2017 04:18 ADDENDUM: CT chest Nov 03, 2010 now submitted for comparison. The focal outpouching of the ascending aorta is relatively unchanged, and may be related to prior surgery. The descending thoracic intimal hematoma is new. Findings discussed with and reconfirmed by [facility] on 04/29/2017 at 4:15 am. Electronically Signed   By: Awilda Metro M.D.   On: 04/29/2017 04:18   Addendum Date: 04/29/2017   ADDENDUM REPORT: 04/29/2017 03:10 ADDENDUM: Critical Value/emergent results were called by telephone at the time of interpretation on 04/29/2017 at 3:08 am to Dr. Lorretta Harp , who verbally acknowledged these results. Electronically Signed   By: Awilda Metro M.D.   On: 04/29/2017 03:10   Result Date: 04/29/2017 CLINICAL DATA:  Acute onset crushing central chest pain and shortness of breath. History of hypertension, hyperlipidemia and atherosclerosis. EXAM: CT ANGIOGRAPHY CHEST, ABDOMEN AND PELVIS TECHNIQUE: Multidetector CT imaging through the chest, abdomen and pelvis was performed using the standard protocol during bolus administration of intravenous contrast. Multiplanar reconstructed images and MIPs were obtained and reviewed to evaluate the vascular anatomy. CONTRAST:  48 cc Isovue 370 COMPARISON:  Chest radiograph April 28, 2017 FINDINGS: CTA CHEST FINDINGS CARDIOVASCULAR: Thoracic aorta is normal course and caliber. No intrinsic density on noncontrast CT. Long segment descending thoracic periaortic  intermediate density collection measuring to 6 mm (38 Hounsfield units). Homogeneous contrast opacification of thoracic aorta without dissection, or contrast extravasation. 8 mm laterally directed outpouching with subcentimeter intermediate density nodule above the level of the coronary artery origins. Heart size is mildly enlarged. Heavily calcified mitral annulus. Moderate coronary artery calcifications, status post CABG. No pericardial effusion. MEDIASTINUM/NODES: No mediastinal mass or lymphadenopathy by CT size criteria. LUNGS/PLEURA: Tracheobronchial tree is patent, no pneumothorax. No pleural effusions, focal consolidations, pulmonary nodules or masses. Minimal lower lobe bronchiectasis. Dependent atelectasis. MUSCULOSKELETAL: Non-suspicious. Status post median sternotomy. Osteopenia and moderate degenerative change of thoracic spine. Review of the MIP images confirms the above findings. CTA ABDOMEN AND PELVIS FINDINGS VASCULAR Aorta: Abdominal aorta is normal in caliber, tortuous course with moderate  calcific atherosclerosis. Homogeneous contrast opacification of aortoiliac vessels without dissection, aneurysm, luminal irregularity, periaortic fluid collections, or contrast extravasation. Celiac: Patent. SMA: Patent. Renals: Patent. IMA: Patent. Inflow: Negative. Veins: Negative, not tailored for evaluation. Review of the MIP images confirms the above findings. NON-VASCULAR HEPATOBILIARY: Subcentimeter gallstone without CT findings of acute cholecystitis. Negative liver. PANCREAS: Normal. SPLEEN: Normal. ADRENALS/URINARY TRACT: Kidneys are orthotopic, demonstrating symmetric enhancement. No nephrolithiasis, hydronephrosis or solid renal masses. Bilateral renal cysts measuring to 4.3 cm on the LEFT. Too small to characterize hypodensities kidneys bilaterally. The unopacified ureters are normal in course and caliber. Urinary bladder is well distended and unremarkable. Normal adrenal glands. STOMACH/BOWEL: The  stomach, small and large bowel are normal in course and caliber without inflammatory changes, sensitivity decreased without oral contrast. Mild sigmoid colonic diverticulosis. Moderate amount of retained large bowel stool. Normal appendix. VASCULAR/LYMPHATIC: No lymphadenopathy by CT size criteria. REPRODUCTIVE: Status post hysterectomy. OTHER: No intraperitoneal free fluid or free air. MUSCULOSKELETAL: Nonacute. Small fat containing umbilical hernia. Osteopenia. Mild old L2 and L3 compression fractures. Review of the MIP images confirms the above findings. IMPRESSION: CTA CHEST: 1. Subcentimeter ascending aortic outpouching concerning for partially thrombosed penetrating ulcer. 2. Long segment descending thoracic aorta intimal hematoma, potentially acute. 3. Mild cardiomegaly.  No acute pulmonary process. CTA ABDOMEN AND PELVIS: 1. No acute vascular process or or acute intra-abdominal/pelvic disease. 2. Cholelithiasis without acute cholecystitis. Aortic Atherosclerosis (ICD10-I70.0). Electronically Signed: By: Awilda Metroourtnay  Bloomer M.D. On: 04/29/2017 03:05     EKG: Independently reviewed.  Sinus rhythm, QTC 474, early R-wave progression, occasional PAC.  Assessment/Plan Principal Problem:   Chest pain Active Problems:   Type 2 diabetes mellitus with complication, without long-term current use of insulin (HCC)   Essential hypertension   Chronic diastolic CHF (congestive heart failure) (HCC)   HLD (hyperlipidemia)   Intramural aortic hematoma (HCC)   Chest pain: Etiology is not clear. Given history of repaired aortic dissection, and the sudden onset in nature, will need to r/o possibility of aortic dissection. Patient had mild SOB, which has resolved, no signs of DVT, less likely to have PE.  -will admit to tele bed as inpt -will get stat CAT of aorta to r/o dissection. -hold off ASA until dissection is ruled out (pt received one dose of ASA already. - cycle CE q6 x3 and repeat EKG in the am  -  prn Nitroglycerin, Morphine, lipitor, metoprolol - Risk factor stratification: will check FLP and A1C  - 2d echo  Addendum: CTA showed subcentimeter ascending aortic outpouching concerning for partially thrombosed penetrating ulcer, and long segment descending thoracic aorta intimal hematoma. -PCCM was consulted. -Thoracic surgeon was consulted -[facility], Dr. Randie Heinzain was cousulted.  Type 2 diabetes mellitus with complication, without long-term current use of insulin: Last A1c 7.7, not well controled. Patient is taking metformin at home -SSI  HTN: -continue metoprolol -Patient is also on Lasix for CHF. -IV hydralazine when necessary  HLD: -lipitor  Chronic diastolic CHF (congestive heart failure) (HCC): 2-D echo on 09/20/16 showed EF of 60-65% with grade 2 diastolic dysfunction. Patient has trace leg edema, but no DVT. No respiratory distress. CHF seems to be compensated. -Continue home dose of Lasix 20 mg daily -Continue metoprolol.    DVT ppx: SCD Code Status: Full code (I discussed with patient in the presents of her daughter, patient was found to be full code today). Family Communication: Yes, patient's daughter and a nephew   at bed side Disposition Plan:  Anticipate discharge back to previous  home environment Consults called:  none Admission status:  Inpatient/tele  Date of Service 04/29/2017    Lorretta Harp [facility] Pager [phone]  If 7PM-7AM, please contact night-coverage [email or web address] Password [facility] 04/29/2017, 6:29 AM

## 2017-04-30 ENCOUNTER — Inpatient Hospital Stay (HOSPITAL_COMMUNITY): Payer: Medicare Other

## 2017-04-30 DIAGNOSIS — E118 Type 2 diabetes mellitus with unspecified complications: Secondary | ICD-10-CM

## 2017-04-30 DIAGNOSIS — Z7189 Other specified counseling: Secondary | ICD-10-CM

## 2017-04-30 DIAGNOSIS — I351 Nonrheumatic aortic (valve) insufficiency: Secondary | ICD-10-CM

## 2017-04-30 DIAGNOSIS — I5032 Chronic diastolic (congestive) heart failure: Secondary | ICD-10-CM

## 2017-04-30 LAB — BASIC METABOLIC PANEL
Anion gap: 10 (ref 5–15)
BUN: 16 mg/dL (ref 6–20)
CHLORIDE: 100 mmol/L — AB (ref 101–111)
CO2: 24 mmol/L (ref 22–32)
CREATININE: 0.86 mg/dL (ref 0.44–1.00)
Calcium: 8.3 mg/dL — ABNORMAL LOW (ref 8.9–10.3)
GFR calc Af Amer: >60 mL/min (ref >60)
GFR, EST NON AFRICAN AMERICAN: 57 mL/min — AB (ref >60)
GLUCOSE: 162 mg/dL — AB (ref 65–99)
Potassium: 3.6 mmol/L (ref 3.5–5.1)
Sodium: 134 mmol/L — ABNORMAL LOW (ref 135–145)

## 2017-04-30 LAB — GLUCOSE, CAPILLARY
GLUCOSE-CAPILLARY: 167 mg/dL — AB (ref 65–99)
GLUCOSE-CAPILLARY: 228 mg/dL — AB (ref 65–99)
Glucose-Capillary: 175 mg/dL — ABNORMAL HIGH (ref 65–99)
Glucose-Capillary: 242 mg/dL — ABNORMAL HIGH (ref 65–99)
Glucose-Capillary: 288 mg/dL — ABNORMAL HIGH (ref 65–99)
Glucose-Capillary: 232 mg/dL — ABNORMAL HIGH (ref 65–99)

## 2017-04-30 LAB — CBC
HEMATOCRIT: 38 % (ref 36.0–46.0)
HEMOGLOBIN: 12.9 g/dL (ref 12.0–15.0)
MCH: 34.3 pg — AB (ref 26.0–34.0)
MCHC: 33.9 g/dL (ref 30.0–36.0)
MCV: 101.1 fL — AB (ref 78.0–100.0)
PLATELETS: 170 10*3/uL (ref 150–400)
RBC: 3.76 MIL/uL — AB (ref 3.87–5.11)
RDW: 12.6 % (ref 11.5–15.5)
WBC: 8.7 10*3/uL (ref 4.0–10.5)

## 2017-04-30 LAB — MAGNESIUM: MAGNESIUM: 1.8 mg/dL (ref 1.7–2.4)

## 2017-04-30 LAB — ECHOCARDIOGRAM COMPLETE
HEIGHTINCHES: 63 in
WEIGHTICAEL: 2832.47 [oz_av]

## 2017-04-30 LAB — PHOSPHORUS: Phosphorus: 3 mg/dL (ref 2.5–4.6)

## 2017-04-30 MED ORDER — PERFLUTREN LIPID MICROSPHERE
1.0000 mL | INTRAVENOUS | Status: AC | PRN
  Administered 2017-04-30: 2 mL via INTRAVENOUS
  Filled 2017-04-30: qty 10

## 2017-04-30 NOTE — Progress Notes (Signed)
  Progress Note    04/30/2017 9:39 AM * No surgery found *  Subjective:  Feeling much better  Vitals:   04/30/17 0557 04/30/17 0839  BP:  (!) 165/72  Pulse: 62 69  Resp: 18   Temp:    SpO2: 95%     Physical Exam: aaox3 Abdomen is soft 2+ femoral pulses Feet are warm and well perfused  CBC    Component Value Date/Time   WBC 8.7 04/30/2017 0253   RBC 3.76 (L) 04/30/2017 0253   HGB 12.9 04/30/2017 0253   HCT 38.0 04/30/2017 0253   PLT 170 04/30/2017 0253   MCV 101.1 (H) 04/30/2017 0253   MCH 34.3 (H) 04/30/2017 0253   MCHC 33.9 04/30/2017 0253   RDW 12.6 04/30/2017 0253   LYMPHSABS 3.0 04/28/2017 2308   MONOABS 0.7 04/28/2017 2308   EOSABS 0.2 04/28/2017 2308   BASOSABS 0.0 04/28/2017 2308    BMET    Component Value Date/Time   NA 134 (L) 04/30/2017 0253   K 3.6 04/30/2017 0253   CL 100 (L) 04/30/2017 0253   CO2 24 04/30/2017 0253   GLUCOSE 162 (H) 04/30/2017 0253   BUN 16 04/30/2017 0253   CREATININE 0.86 04/30/2017 0253   CALCIUM 8.3 (L) 04/30/2017 0253   GFRNONAA 57 (L) 04/30/2017 0253   GFRAA >60 04/30/2017 0253    INR    Component Value Date/Time   INR 0.89 04/18/2009 0155     Intake/Output Summary (Last 24 hours) at 04/30/2017 0939 Last data filed at 04/30/2017 0400 Gross per 24 hour  Intake 665 ml  Output 1650 ml  Net -985 ml     Assessment:  83 y.o. female is here with descending thoracic imh with elevated bp on arrival.  Her bp is now under much better control and she if free of pain. Any future repair for malperfusion or aneurysmal degeneration would be complex and unlikely she would want or tolerate. Will not pursue further imaging unless indicated. Discussed with family that she will need to adhere to her medication regimen.    Raneisha Bress C. Randie Heinzain, MD [facility] [place] Office: [phone] Pager: [phone]  04/30/2017 9:39 AM

## 2017-04-30 NOTE — Progress Notes (Addendum)
PROGRESS NOTE  [name] ION:[id number]:[id number] DOB: [birth year] DOA: 04/28/2017 PCP: Lupita RaiderShaw, Kimberlee, MD   LOS: 1 day   Brief Narrative / Interim history: 83 year old female with PMH of aortic dissection (repaired in 2008), HTN, HLD, DM, COPD, dCHF, who was admitted to the hospital on 11/2 with substernal chest pain, CT on admission revealed subcentimeter ascending aortic outpouching concerning for partially thrombosed ulcer, as well as long segment descending thoracic aorta intimal hematoma.  Thoracic surgery was consulted and patient was admitted to the ICU.  CXR 11/2 > Shallow inspiration with atelectatic changes. No focal consolidation CTA Chest 11/3 > . Subcentimeter ascending aortic outpouching concerning for partially thrombosed penetrating ulcer.Long segment descending thoracic aorta intimal hematoma, potentially acute. Mild cardiomegaly. No acute pulmonary process. No acute vascular process or or acute intra-abdominal/pelvic disease. Cholelithiasis without acute cholecystitis. ECHO 11/3 >>>   Assessment & Plan: Principal Problem:   Chest pain Active Problems:   Type 2 diabetes mellitus with complication, without long-term current use of insulin (HCC)   Essential hypertension   Chronic diastolic CHF (congestive heart failure) (HCC)   HLD (hyperlipidemia)   Intramural aortic hematoma (HCC)   Chest pain -In the setting of descending thoracic aorta intramural hematoma with poorly controlled blood pressures on arrival -Discussed with Dr. Randie Heinzain with vascular surgery, no need for repeat imaging at this point and blood pressure control is of utmost importance.  Does not need to be on bedrest anymore, will order physical therapy to evaluate patient  History of repaired aortic dissection in 2008 -Status post repair, thoracic surgery and vascular surgery consulted, no need for repeat imaging as discussed above.  Continue to hold aspirin.  Continue Toprol for blood pressure  control  COPD -No wheezing, this appears stable, continue albuterol  Hypertension -Continue metoprolol, furosemide, hydralazine as needed -Blood pressure much improved today,  Hyperlipidemia -Continue Lipitor  Diabetes mellitus -Sliding scale, hold metformin  Goals of care/DNR discussion -Discussed with patient and daughter at bedside regarding CODE STATUS.  Per their wishes, will change patient's CODE STATUS to DNR, she would not want to be hooked up to artificial life support  DVT prophylaxis: SCD Code Status: DNR Family Communication: d/w daughter at bedside Disposition Plan: home 1-2 days  Consultants:   Thoracic surgery  Vascular surgery  Procedures:   2D echo: pending  Antimicrobials:  None    Subjective: - no chest pain, shortness of breath, no abdominal pain, nausea or vomiting.  Asking when can she go home  Objective: Vitals:   04/30/17 0557 04/30/17 0800 04/30/17 0839 04/30/17 0930  BP:  (!) 165/72 (!) 165/72 (!) 159/78  Pulse: 62 62 69 72  Resp: 18 13  (!) 24  Temp:  98.2 F (36.8 C)    TempSrc:  Oral    SpO2: 95% 96%    Weight: 80.3 kg (177 lb 0.5 oz)     Height:        Intake/Output Summary (Last 24 hours) at 04/30/2017 1141 Last data filed at 04/30/2017 1000 Gross per 24 hour  Intake 665 ml  Output 1975 ml  Net -1310 ml   Filed Weights   04/29/17 1734 04/30/17 0557  Weight: 80.6 kg (177 lb 11.1 oz) 80.3 kg (177 lb 0.5 oz)    Examination:  Constitutional: NAD Eyes:  lids and conjunctivae normal ENMT: Mucous membranes are moist.  Respiratory: clear to auscultation bilaterally, no wheezing, no crackles. Normal respiratory effort. No accessory muscle use.  Cardiovascular: Regular rate and rhythm,  no murmurs / rubs / gallops. No LE edema. 2+ pedal pulses. No carotid bruits.  Abdomen: no tenderness. Bowel sounds positive. Neurologic: CN 2-12 grossly intact. Strength 5/5 in all 4.  Psychiatric: Normal judgment and insight. Alert and  oriented x 3. Normal mood.    Data Reviewed: I have independently reviewed following labs and imaging studies   CBC: Recent Labs  Lab 04/28/17 2308 04/30/17 0253  WBC 7.8 8.7  NEUTROABS 3.8  --   HGB 13.0 12.9  HCT 39.4 38.0  MCV 101.0* 101.1*  PLT 181 170   Basic Metabolic Panel: Recent Labs  Lab 04/28/17 2308 04/30/17 0253  NA 134* 134*  K 3.5 3.6  CL 101 100*  CO2 23 24  GLUCOSE 229* 162*  BUN 20 16  CREATININE 0.89 0.86  CALCIUM 8.6* 8.3*  MG  --  1.8  PHOS  --  3.0   GFR: Estimated Creatinine Clearance: 42.8 mL/min (by C-G formula based on SCr of 0.86 mg/dL). Liver Function Tests: No results for input(s): AST, ALT, ALKPHOS, BILITOT, PROT, ALBUMIN in the last 168 hours. No results for input(s): LIPASE, AMYLASE in the last 168 hours. No results for input(s): AMMONIA in the last 168 hours. Coagulation Profile: No results for input(s): INR, PROTIME in the last 168 hours. Cardiac Enzymes: Recent Labs  Lab 04/29/17 0140 04/29/17 0857 04/29/17 1454  TROPONINI <0.03 <0.03 <0.03   BNP (last 3 results) No results for input(s): PROBNP in the last 8760 hours. HbA1C: Recent Labs    04/29/17 0857  HGBA1C 8.0*   CBG: Recent Labs  Lab 04/29/17 1609 04/29/17 1949 04/30/17 0002 04/30/17 0406 04/30/17 0824  GLUCAP 229* 198* 146* 175* 167*   Lipid Profile: Recent Labs    04/29/17 0457  CHOL 198  HDL 49  LDLCALC 124*  TRIG 123  CHOLHDL 4.0   Thyroid Function Tests: No results for input(s): TSH, T4TOTAL, FREET4, T3FREE, THYROIDAB in the last 72 hours. Anemia Panel: No results for input(s): VITAMINB12, FOLATE, FERRITIN, TIBC, IRON, RETICCTPCT in the last 72 hours. Urine analysis:    Component Value Date/Time   COLORURINE AMBER (A) 09/20/2016 0017   APPEARANCEUR TURBID (A) 09/20/2016 0017   LABSPEC 1.014 09/20/2016 0017   PHURINE 5.0 09/20/2016 0017   GLUCOSEU NEGATIVE 09/20/2016 0017   HGBUR SMALL (A) 09/20/2016 0017   BILIRUBINUR NEGATIVE  09/20/2016 0017   KETONESUR 20 (A) 09/20/2016 0017   PROTEINUR 100 (A) 09/20/2016 0017   UROBILINOGEN 0.2 04/18/2009 0212   NITRITE NEGATIVE 09/20/2016 0017   LEUKOCYTESUR LARGE (A) 09/20/2016 0017   Sepsis Labs: Invalid input(s): PROCALCITONIN, LACTICIDVEN  Recent Results (from the past 240 hour(s))  MRSA PCR Screening     Status: None   Collection Time: 04/29/17  8:19 AM  Result Value Ref Range Status   MRSA by PCR NEGATIVE NEGATIVE Final    Comment:        The GeneXpert MRSA Assay (FDA approved for NASAL specimens only), is one component of a comprehensive MRSA colonization surveillance program. It is not intended to diagnose MRSA infection nor to guide or monitor treatment for MRSA infections.       Radiology Studies: Dg Chest Port 1 View  Result Date: 04/28/2017 CLINICAL DATA:  83 year old female with chest pressure and shortness of breath. EXAM: PORTABLE CHEST 1 VIEW COMPARISON:  Chest radiograph dated 09/19/2016 FINDINGS: There is shallow inspiration. Left lung base atelectatic changes noted. No focal consolidation, pleural effusion, or pneumothorax. The cardiac silhouette is within normal  limits. There is atherosclerotic calcification of the aortic arch. Median sternotomy wires and CABG vascular clips noted. No acute osseous pathology. IMPRESSION: Shallow inspiration with atelectatic changes. No focal consolidation. Electronically Signed   By: Elgie Collard M.D.   On: 04/28/2017 23:28   Ct Angio Chest/abd/pel For Dissection W And/or W/wo  Addendum Date: 04/29/2017   ADDENDUM REPORT: 04/29/2017 04:18 ADDENDUM: CT chest Nov 03, 2010 now submitted for comparison. The focal outpouching of the ascending aorta is relatively unchanged, and may be related to prior surgery. The descending thoracic intimal hematoma is new. Findings discussed with and reconfirmed by [facility] on 04/29/2017 at 4:15 am. Electronically Signed   By: Awilda Metro M.D.   On: 04/29/2017 04:18    Addendum Date: 04/29/2017   ADDENDUM REPORT: 04/29/2017 03:10 ADDENDUM: Critical Value/emergent results were called by telephone at the time of interpretation on 04/29/2017 at 3:08 am to Dr. Lorretta Harp , who verbally acknowledged these results. Electronically Signed   By: Awilda Metro M.D.   On: 04/29/2017 03:10   Result Date: 04/29/2017 CLINICAL DATA:  Acute onset crushing central chest pain and shortness of breath. History of hypertension, hyperlipidemia and atherosclerosis. EXAM: CT ANGIOGRAPHY CHEST, ABDOMEN AND PELVIS TECHNIQUE: Multidetector CT imaging through the chest, abdomen and pelvis was performed using the standard protocol during bolus administration of intravenous contrast. Multiplanar reconstructed images and MIPs were obtained and reviewed to evaluate the vascular anatomy. CONTRAST:  48 cc Isovue 370 COMPARISON:  Chest radiograph April 28, 2017 FINDINGS: CTA CHEST FINDINGS CARDIOVASCULAR: Thoracic aorta is normal course and caliber. No intrinsic density on noncontrast CT. Long segment descending thoracic periaortic intermediate density collection measuring to 6 mm (38 Hounsfield units). Homogeneous contrast opacification of thoracic aorta without dissection, or contrast extravasation. 8 mm laterally directed outpouching with subcentimeter intermediate density nodule above the level of the coronary artery origins. Heart size is mildly enlarged. Heavily calcified mitral annulus. Moderate coronary artery calcifications, status post CABG. No pericardial effusion. MEDIASTINUM/NODES: No mediastinal mass or lymphadenopathy by CT size criteria. LUNGS/PLEURA: Tracheobronchial tree is patent, no pneumothorax. No pleural effusions, focal consolidations, pulmonary nodules or masses. Minimal lower lobe bronchiectasis. Dependent atelectasis. MUSCULOSKELETAL: Non-suspicious. Status post median sternotomy. Osteopenia and moderate degenerative change of thoracic spine. Review of the MIP images confirms  the above findings. CTA ABDOMEN AND PELVIS FINDINGS VASCULAR Aorta: Abdominal aorta is normal in caliber, tortuous course with moderate calcific atherosclerosis. Homogeneous contrast opacification of aortoiliac vessels without dissection, aneurysm, luminal irregularity, periaortic fluid collections, or contrast extravasation. Celiac: Patent. SMA: Patent. Renals: Patent. IMA: Patent. Inflow: Negative. Veins: Negative, not tailored for evaluation. Review of the MIP images confirms the above findings. NON-VASCULAR HEPATOBILIARY: Subcentimeter gallstone without CT findings of acute cholecystitis. Negative liver. PANCREAS: Normal. SPLEEN: Normal. ADRENALS/URINARY TRACT: Kidneys are orthotopic, demonstrating symmetric enhancement. No nephrolithiasis, hydronephrosis or solid renal masses. Bilateral renal cysts measuring to 4.3 cm on the LEFT. Too small to characterize hypodensities kidneys bilaterally. The unopacified ureters are normal in course and caliber. Urinary bladder is well distended and unremarkable. Normal adrenal glands. STOMACH/BOWEL: The stomach, small and large bowel are normal in course and caliber without inflammatory changes, sensitivity decreased without oral contrast. Mild sigmoid colonic diverticulosis. Moderate amount of retained large bowel stool. Normal appendix. VASCULAR/LYMPHATIC: No lymphadenopathy by CT size criteria. REPRODUCTIVE: Status post hysterectomy. OTHER: No intraperitoneal free fluid or free air. MUSCULOSKELETAL: Nonacute. Small fat containing umbilical hernia. Osteopenia. Mild old L2 and L3 compression fractures. Review of the MIP images confirms the above findings.  IMPRESSION: CTA CHEST: 1. Subcentimeter ascending aortic outpouching concerning for partially thrombosed penetrating ulcer. 2. Long segment descending thoracic aorta intimal hematoma, potentially acute. 3. Mild cardiomegaly.  No acute pulmonary process. CTA ABDOMEN AND PELVIS: 1. No acute vascular process or or acute  intra-abdominal/pelvic disease. 2. Cholelithiasis without acute cholecystitis. Aortic Atherosclerosis (ICD10-I70.0). Electronically Signed: By: Awilda Metro M.D. On: 04/29/2017 03:05     Scheduled Meds: . atorvastatin  20 mg Oral Daily  . calcium carbonate  1,250 mg Oral Daily  . cholecalciferol  2,000 Units Oral Daily  . furosemide  20 mg Oral Daily  . insulin aspart  2-6 Units Subcutaneous Q4H  . metoprolol succinate  50 mg Oral Daily  . multivitamin with minerals  1 tablet Oral Daily  . omega-3 acid ethyl esters  1 g Oral Daily  . trimethoprim  100 mg Oral Daily   Continuous Infusions: . sodium chloride      Pamella Pert, MD, PhD [facility] Pager [phone]  If 7PM-7AM, please contact night-coverage [email or web address] Password [email or web address] 04/30/2017, 11:41 AM

## 2017-04-30 NOTE — Progress Notes (Signed)
  Echocardiogram 2D Echocardiogram has been performed.  [name] 04/30/2017, 8:50 AM

## 2017-05-01 ENCOUNTER — Other Ambulatory Visit: Payer: Self-pay

## 2017-05-01 LAB — GLUCOSE, CAPILLARY
Glucose-Capillary: 171 mg/dL — ABNORMAL HIGH (ref 65–99)
Glucose-Capillary: 210 mg/dL — ABNORMAL HIGH (ref 65–99)
Glucose-Capillary: 158 mg/dL — ABNORMAL HIGH (ref 65–99)
Glucose-Capillary: 270 mg/dL — ABNORMAL HIGH (ref 65–99)
Glucose-Capillary: 297 mg/dL — ABNORMAL HIGH (ref 65–99)

## 2017-05-01 NOTE — Evaluation (Signed)
Physical Therapy Evaluation Patient Details Name: [name] MRN: [id number] DOB: [birth year] Today's Date: 05/01/2017   History of Present Illness   Mikayla Modelsie B Franson is a 83 y.o. female with medical history significant of repaired aortic dissection (approximately 9-11 years ago per her daughter), hypertension, hyperlipidemia, diabetes mellitus, COPD, dCHF, who presents with chest pain.  Clinical Impression  Pt very stuck in her ways and adamant about maintaining indep. Pt functioning at min guard level right now. Pt was mowing grass and driving PTA but now requires close min guard for safe ambulation without AD. Pt refuses to use RW at home. Pt with noted decreased safety awareness and impaired balance. Pt to benefit from outpt PT to address strength and balance to progress to indep with function.    Follow Up Recommendations Outpatient PT;Supervision/Assistance - 24 hour    Equipment Recommendations  None recommended by PT    Recommendations for Other Services       Precautions / Restrictions Precautions Precautions: Fall Restrictions Weight Bearing Restrictions: No      Mobility  Bed Mobility Overal bed mobility: Modified Independent             General bed mobility comments: HOB elevated, used bed rail but family reports she has an adjustable bed at home  Transfers Overall transfer level: Needs assistance Equipment used: None;Rolling walker (2 wheeled) Transfers: Sit to/from Stand Sit to Stand: Min assist         General transfer comment: v/c's for safe hand placement. Despite max v/c's pt impulsively trying to stand via pulling up with RW stating, "i can push up with my legs"  Ambulation/Gait Ambulation/Gait assistance: Min guard;Min assist Ambulation Distance (Feet): 150 Feet Assistive device: Rolling walker (2 wheeled);None Gait Pattern/deviations: Step-through pattern;Decreased stride length Gait velocity: dec without AD   General Gait Details: began  amb with RW and pt demo'd stabiltiy requiring min guard assist more for walker safety. pt then amb 7175' without RW and had decreased cadence, wider base of support but no overt episode of LOB.   Stairs            Wheelchair Mobility    Modified Rankin (Stroke Patients Only)       Balance Overall balance assessment: Needs assistance Sitting-balance support: Feet supported;No upper extremity supported Sitting balance-Leahy Scale: Good     Standing balance support: No upper extremity supported Standing balance-Leahy Scale: Fair                               Pertinent Vitals/Pain Pain Assessment: No/denies pain    Home Living Family/patient expects to be discharged to:: Private residence Living Arrangements: Alone(but has daughters who stay with her 24/7) Available Help at Discharge: Family;Available 24 hours/day Type of Home: House Home Access: Stairs to enter Entrance Stairs-Rails: Right Entrance Stairs-Number of Steps: 1 or 3 Home Layout: One level;Laundry or work area in [employer]basement Home Equipment: Environmental consultantWalker - 2 wheels      Prior Function Level of Independence: Independent         Comments: pt reports mowing grass, driving, grocery shopping and doing all ADLs     Hand Dominance   Dominant Hand: Right    Extremity/Trunk Assessment   Upper Extremity Assessment Upper Extremity Assessment: Generalized weakness    Lower Extremity Assessment Lower Extremity Assessment: Generalized weakness    Cervical / Trunk Assessment Cervical / Trunk Assessment: Normal  Communication   Communication: No  difficulties  Cognition Arousal/Alertness: Awake/alert Behavior During Therapy: WFL for tasks assessed/performed Overall Cognitive Status: Within Functional Limits for tasks assessed                                 General Comments: pt appears to have decreased safety awareness and deficits however family reports this to be her baseline as she  is determined to remain indep.      General Comments      Exercises     Assessment/Plan    PT Assessment Patient needs continued PT services  PT Problem List Decreased strength;Decreased activity tolerance;Decreased balance;Decreased mobility;Decreased knowledge of use of DME;Decreased safety awareness       PT Treatment Interventions DME instruction;Gait training;Functional mobility training;Stair training;Therapeutic activities;Therapeutic exercise;Neuromuscular re-education;Balance training    PT Goals (Current goals can be found in the Care Plan section)  Acute Rehab PT Goals Patient Stated Goal: home PT Goal Formulation: With patient Time For Goal Achievement: 05/08/17 Potential to Achieve Goals: Good    Frequency Min 3X/week   Barriers to discharge        Co-evaluation               AM-PAC PT "6 Clicks" Daily Activity  Outcome Measure Difficulty turning over in bed (including adjusting bedclothes, sheets and blankets)?: A Little Difficulty moving from lying on back to sitting on the side of the bed? : A Little Difficulty sitting down on and standing up from a chair with arms (e.g., wheelchair, bedside commode, etc,.)?: A Little Help needed moving to and from a bed to chair (including a wheelchair)?: A Little Help needed walking in hospital room?: A Little Help needed climbing 3-5 steps with a railing? : A Little 6 Click Score: 18    End of Session Equipment Utilized During Treatment: Gait belt Activity Tolerance: Patient tolerated treatment well Patient left: in chair;with call bell/phone within reach;with chair alarm set;with family/visitor present Nurse Communication: Mobility status PT Visit Diagnosis: Unsteadiness on feet (R26.81)    Time: 1015-1050 PT Time Calculation (min) (ACUTE ONLY): 35 min   Charges:   PT Evaluation $PT Eval Moderate Complexity: 1 Mod PT Treatments $Gait Training: 8-22 mins   PT G Codes:        Lewis Shock, PT,  DPT Pager #: [phone] Office #: [phone]   Eowyn Tabone M Quentin Strebel 05/01/2017, 12:36 PM

## 2017-05-01 NOTE — Progress Notes (Signed)
  Progress Note    05/01/2017 1:22 PM * No surgery found *  Subjective:  Eating breakfast at time of exam, no complaints  Vitals:   05/01/17 1200 05/01/17 1301  BP: (!) 111/53   Pulse:    Resp: 16   Temp:  97.8 F (36.6 C)  SpO2: 93%     Physical Exam: Awake and alert Abdomen is soft  Feet are warm and sensorimotor in tact  CBC    Component Value Date/Time   WBC 8.7 04/30/2017 0253   RBC 3.76 (L) 04/30/2017 0253   HGB 12.9 04/30/2017 0253   HCT 38.0 04/30/2017 0253   PLT 170 04/30/2017 0253   MCV 101.1 (H) 04/30/2017 0253   MCH 34.3 (H) 04/30/2017 0253   MCHC 33.9 04/30/2017 0253   RDW 12.6 04/30/2017 0253   LYMPHSABS 3.0 04/28/2017 2308   MONOABS 0.7 04/28/2017 2308   EOSABS 0.2 04/28/2017 2308   BASOSABS 0.0 04/28/2017 2308    BMET    Component Value Date/Time   NA 134 (L) 04/30/2017 0253   K 3.6 04/30/2017 0253   CL 100 (L) 04/30/2017 0253   CO2 24 04/30/2017 0253   GLUCOSE 162 (H) 04/30/2017 0253   BUN 16 04/30/2017 0253   CREATININE 0.86 04/30/2017 0253   CALCIUM 8.3 (L) 04/30/2017 0253   GFRNONAA 57 (L) 04/30/2017 0253   GFRAA >60 04/30/2017 0253    INR    Component Value Date/Time   INR 0.89 04/18/2009 0155     Intake/Output Summary (Last 24 hours) at 05/01/2017 1322 Last data filed at 05/01/2017 1030 Gross per 24 hour  Intake -  Output 1100 ml  Net -1100 ml     Assessment/plan:  83 y.o. female is here with descending thoracic imh with elevated bp on arrival. bp controlled now and will need to be compliant with medications at home. No vascular intervention planned. Please call with questions or changes in clinical course.     Wealthy Danielski C. Randie Heinzain, MD [facility] Office: [phone] Pager: [phone]  05/01/2017 1:22 PM

## 2017-05-01 NOTE — Discharge Instructions (Signed)
Follow with [name], Kimberlee, MD in 5-7 days  Please get a complete blood count and chemistry panel checked by your Primary MD at your next visit, and again as instructed by your Primary MD. Please get your medications reviewed and adjusted by your Primary MD.  Please request your Primary MD to go over all Hospital Tests and Procedure/Radiological results at the follow up, please get all Hospital records sent to your Prim MD by signing hospital release before you go home.  If you had Pneumonia of Lung problems at the Hospital: Please get a 2 view Chest X ray done in 6-8 weeks after hospital discharge or sooner if instructed by your Primary MD.  If you have Congestive Heart Failure: Please call your Cardiologist or Primary MD anytime you have any of the following symptoms:  1) 3 pound weight gain in 24 hours or 5 pounds in 1 week  2) shortness of breath, with or without a dry hacking cough  3) swelling in the hands, feet or stomach  4) if you have to sleep on extra pillows at night in order to breathe  Follow cardiac low salt diet and 1.5 lit/day fluid restriction.  If you have diabetes Accuchecks 4 times/day, Once in AM empty stomach and then before each meal. Log in all results and show them to your primary doctor at your next visit. If any glucose reading is under 80 or above 300 call your primary MD immediately.  If you have Seizure/Convulsions/Epilepsy: Please do not drive, operate heavy machinery, participate in activities at heights or participate in high speed sports until you have seen by Primary MD or a Neurologist and advised to do so again.  If you had Gastrointestinal Bleeding: Please ask your Primary MD to check a complete blood count within one week of discharge or at your next visit. Your endoscopic/colonoscopic biopsies that are pending at the time of discharge, will also need to followed by your Primary MD.  Get Medicines reviewed and adjusted. Please take all your  medications with you for your next visit with your Primary MD  Please request your Primary MD to go over all hospital tests and procedure/radiological results at the follow up, please ask your Primary MD to get all Hospital records sent to his/her office.  If you experience worsening of your admission symptoms, develop shortness of breath, life threatening emergency, suicidal or homicidal thoughts you must seek medical attention immediately by calling 911 or calling your MD immediately  if symptoms less severe.  You must read complete instructions/literature along with all the possible adverse reactions/side effects for all the Medicines you take and that have been prescribed to you. Take any new Medicines after you have completely understood and accpet all the possible adverse reactions/side effects.   Do not drive or operate heavy machinery when taking Pain medications.   Do not take more than prescribed Pain, Sleep and Anxiety Medications  Special Instructions: If you have smoked or chewed Tobacco  in the last 2 yrs please stop smoking, stop any regular Alcohol  and or any Recreational drug use.  Wear Seat belts while driving.  Please note You were cared for by a hospitalist during your hospital stay. If you have any questions about your discharge medications or the care you received while you were in the hospital after you are discharged, you can call the unit and asked to speak with the hospitalist on call if the hospitalist that took care of you is not available. Once  you are discharged, your primary care physician will handle any further medical issues. Please note that NO REFILLS for any discharge medications will be authorized once you are discharged, as it is imperative that you return to your primary care physician (or establish a relationship with a primary care physician if you do not have one) for your aftercare needs so that they can reassess your need for medications and monitor your  lab values.  You can reach the hospitalist office at phone [phone] or fax [phone]   If you do not have a primary care physician, you can call [phone] for a physician referral.  Activity: As tolerated with Full fall precautions use walker/cane & assistance as needed  Diet: diabetic  Disposition Home

## 2017-05-01 NOTE — Progress Notes (Signed)
Inpatient Diabetes Program Recommendations  AACE/ADA: New Consensus Statement on Inpatient Glycemic Control (2015)  Target Ranges:  Prepandial:   less than 140 mg/dL      Peak postprandial:   less than 180 mg/dL (1-2 hours)      Critically ill patients:  140 - 180 mg/dL   Lab Results  Component Value Date   GLUCAP 158 (H) 05/01/2017   HGBA1C 8.0 (H) 04/29/2017    Review of Glycemic ControlResults for Nadara ModeLOMIA, Jaidon B (MRN [id number]) as of 05/01/2017 10:20  Ref. Range 04/30/2017 19:19 04/30/2017 20:51 05/01/2017 01:39 05/01/2017 04:25 05/01/2017 08:04  Glucose-Capillary Latest Ref Range: 65 - 99 mg/dL 952288 (H) 841232 (H) 324171 (H) 210 (H) 158 (H)   Diabetes history: Type 2 diabetes Outpatient Diabetes medications: Metformin 500 mg bid Current orders for Inpatient glycemic control:  Novolog 2-4-6 q 4 hours  Inpatient Diabetes Program Recommendations:   Please consider d/c of ICU Novolog correction.  Consider adding Lantus 8 units daily and Novolog sensitive correction tid with meals and HS.   Thanks, Beryl MeagerJenny Nasri Boakye, RN, BC-ADM Inpatient Diabetes Coordinator Pager [phone] (8a-5p)

## 2017-05-01 NOTE — Discharge Summary (Signed)
Physician Discharge Summary  MENUCHA DICESARE OZH:[id number] DOB: [birth year] DOA: 04/28/2017  PCP: Lupita Raider, MD  Admit date: 04/28/2017 Discharge date: 05/01/2017  Admitted From: home Disposition:  home  Recommendations for Outpatient Follow-up:  1. Follow up with PCP in 1-2 weeks  Home Health: PT Equipment/Devices: bedside commode  Discharge Condition: stable CODE STATUS: DNR Diet recommendation: heart healthy  HPI: Per Dr. Clyde Lundborg, [name] is a 83 y.o. female with medical history significant of repaired aortic dissection (approximately 9-11 years ago per her daughter), hypertension, hyperlipidemia, diabetes mellitus, COPD, dCHF, who presents with chest pain. Patient states that her chest pain started suddenly at about 10:20 p.m. It is located in the substernal area, 9 out of 10 in severity, crushing and pressure-like pain, radiating to the left scapula. It is associated with mild SOB. The shortness breath has resolved currently. Patient does not have cough, fever or chills. Denies tenderness in calf area. Patient does not have nausea, vomiting, diarrhea or abdominal pain. No symptoms of UTI or unilateral weakness. ED Course: pt was found to have negative troponin, WBC 7.8, BNP 169.5, electrolytes and renal function okay, negative chest x-ray, temperature normal, tachycardia, oxygen saturation 92% on room air. Patient is admitted to telemetry bed as inpatient. Pending CT angiogram of aorta  Hospital Course: Discharge Diagnoses:  Principal Problem:   Chest pain Active Problems:   Type 2 diabetes mellitus with complication, without long-term current use of insulin (HCC)   Essential hypertension   Chronic diastolic CHF (congestive heart failure) (HCC)   HLD (hyperlipidemia)   Intramural aortic hematoma (HCC)   Chest pain -patient was admitted to the hospital with chest pain, this is likely in the setting of descending thoracic aorta intramural hematoma with poorly controlled  blood pressures on arrival.  Thoracic surgery as well as vascular surgery were consulted.  They recommend conservative management with blood pressure control, and she is not currently a good candidate for surgical intervention.  With blood pressure control, her condition stabilized, her chest pain resolved and she returned clinically back to normal.  She was able to work with physical therapy and ambulate in the hallway without difficulties. History of repaired aortic dissection in 2008 -Status post repair, thoracic surgery and vascular surgery consulted as discussed above.  Continue to hold aspirin.  Continue Toprol for blood pressure control COPD -No wheezing, this appears stable, continue albuterol Hypertension -Continue metoprolol, furosemide, her blood pressure has been well controlled with this regimen in the hospital, raising concern about noncompliance at home as patient lives alone. Have arranged home health RN to assist with medications. Hyperlipidemia -Continue Lipitor Diabetes mellitus -hemoglobin A1c is 8.0, and according to the newer guidelines this is at goal in her age group.  Continue metformin. Goals of care/DNR discussion -patient was admitted full code, and this was changed to DNR during her hospital stay    Discharge Instructions   Allergies as of 05/01/2017      Reactions   Actonel [risedronate Sodium] Nausea And Vomiting   Codeine    Unknown    Lipitor [atorvastatin] Nausea And Vomiting, Other (See Comments)   Can only take low doses of med    Macrobid [nitrofurantoin Monohyd Macro] Swelling, Other (See Comments)   Headaches   Septra [sulfamethoxazole-trimethoprim] Rash      Medication List    STOP taking these medications   aspirin 81 MG tablet     TAKE these medications   atorvastatin 20 MG tablet Commonly known as:  LIPITOR Take 20 mg by mouth every morning.   calcium carbonate 200 MG capsule Take 2,000 mg by mouth every morning.   Fish Oil 1000 MG  Caps Take 1,000 mg by mouth daily.   furosemide 20 MG tablet Commonly known as:  LASIX Take 20 mg by mouth every morning.   metFORMIN 500 MG tablet Commonly known as:  GLUCOPHAGE Take 500 mg by mouth 2 (two) times daily.   metoprolol succinate 50 MG 24 hr tablet Commonly known as:  TOPROL-XL Take 50 mg by mouth daily. Take with or immediately following a meal.   MULTIVITAMIN PO Take 1 tablet by mouth every morning.   potassium chloride 10 MEQ tablet Commonly known as:  K-DUR Take 1 tablet (10 mEq total) by mouth daily.   trimethoprim 100 MG tablet Commonly known as:  TRIMPEX Take 100 mg by mouth daily.   Vitamin D 2000 units tablet Take 2,000 Units by mouth every morning.            Durable Medical Equipment  (From admission, onward)        Start     Ordered   05/01/17 1410  For home use only DME 3 n 1  Once     05/01/17 1409     Follow-up Information    Lupita RaiderShaw, Kimberlee, MD. Schedule an appointment as soon as possible for a visit in 2 week(s).   Specialty:  Family Medicine Contact information: [address] [place] [id number] [phone]           Consultations:  Thoracic surgery  Vascular surgery  Critical care  Procedures/Studies:  2D echo Study Conclusions - Left ventricle: The cavity size was normal. There was mild concentric hypertrophy. Systolic function was normal. The estimated ejection fraction was in the range of 60% to 65%. Wall motion was normal; there were no regional wall motion abnormalities. Features are consistent with a pseudonormal left ventricular filling pattern, with concomitant abnormal relaxation and increased filling pressure (grade 2 diastolic dysfunction). Doppler parameters are consistent with elevated ventricular end-diastolic filling pressure. - Aortic valve: Trileaflet; mildly thickened, mildly calcified leaflets. There was mild regurgitation. - Aortic root: The aortic root was normal in size. -  Mitral valve: Calcified annulus. Mildly thickened leaflets. There was moderate regurgitation. - Left atrium: The atrium was mildly dilated. - Right ventricle: Systolic function was normal. - Right atrium: The atrium was normal in size. - Tricuspid valve: There was mild regurgitation. - Inferior vena cava: The vessel was normal in size. - Pericardium, extracardiac: There was no pericardial effusion.  Dg Chest Port 1 View  Result Date: 04/28/2017 CLINICAL DATA:  83 year old female with chest pressure and shortness of breath. EXAM: PORTABLE CHEST 1 VIEW COMPARISON:  Chest radiograph dated 09/19/2016 FINDINGS: There is shallow inspiration. Left lung base atelectatic changes noted. No focal consolidation, pleural effusion, or pneumothorax. The cardiac silhouette is within normal limits. There is atherosclerotic calcification of the aortic arch. Median sternotomy wires and CABG vascular clips noted. No acute osseous pathology. IMPRESSION: Shallow inspiration with atelectatic changes. No focal consolidation. Electronically Signed   By: Elgie CollardArash  Radparvar M.D.   On: 04/28/2017 23:28   Ct Angio Chest/abd/pel For Dissection W And/or W/wo  Addendum Date: 04/29/2017   ADDENDUM REPORT: 04/29/2017 04:18 ADDENDUM: CT chest Nov 03, 2010 now submitted for comparison. The focal outpouching of the ascending aorta is relatively unchanged, and may be related to prior surgery. The descending thoracic intimal hematoma is new. Findings discussed  with and reconfirmed by [facility] on 04/29/2017 at 4:15 am. Electronically Signed   By: Awilda Metro M.D.   On: 04/29/2017 04:18   Addendum Date: 04/29/2017   ADDENDUM REPORT: 04/29/2017 03:10 ADDENDUM: Critical Value/emergent results were called by telephone at the time of interpretation on 04/29/2017 at 3:08 am to Dr. Lorretta Harp , who verbally acknowledged these results. Electronically Signed   By: Awilda Metro M.D.   On: 04/29/2017 03:10   Result Date: 04/29/2017 CLINICAL  DATA:  Acute onset crushing central chest pain and shortness of breath. History of hypertension, hyperlipidemia and atherosclerosis. EXAM: CT ANGIOGRAPHY CHEST, ABDOMEN AND PELVIS TECHNIQUE: Multidetector CT imaging through the chest, abdomen and pelvis was performed using the standard protocol during bolus administration of intravenous contrast. Multiplanar reconstructed images and MIPs were obtained and reviewed to evaluate the vascular anatomy. CONTRAST:  48 cc Isovue 370 COMPARISON:  Chest radiograph April 28, 2017 FINDINGS: CTA CHEST FINDINGS CARDIOVASCULAR: Thoracic aorta is normal course and caliber. No intrinsic density on noncontrast CT. Long segment descending thoracic periaortic intermediate density collection measuring to 6 mm (38 Hounsfield units). Homogeneous contrast opacification of thoracic aorta without dissection, or contrast extravasation. 8 mm laterally directed outpouching with subcentimeter intermediate density nodule above the level of the coronary artery origins. Heart size is mildly enlarged. Heavily calcified mitral annulus. Moderate coronary artery calcifications, status post CABG. No pericardial effusion. MEDIASTINUM/NODES: No mediastinal mass or lymphadenopathy by CT size criteria. LUNGS/PLEURA: Tracheobronchial tree is patent, no pneumothorax. No pleural effusions, focal consolidations, pulmonary nodules or masses. Minimal lower lobe bronchiectasis. Dependent atelectasis. MUSCULOSKELETAL: Non-suspicious. Status post median sternotomy. Osteopenia and moderate degenerative change of thoracic spine. Review of the MIP images confirms the above findings. CTA ABDOMEN AND PELVIS FINDINGS VASCULAR Aorta: Abdominal aorta is normal in caliber, tortuous course with moderate calcific atherosclerosis. Homogeneous contrast opacification of aortoiliac vessels without dissection, aneurysm, luminal irregularity, periaortic fluid collections, or contrast extravasation. Celiac: Patent. SMA: Patent.  Renals: Patent. IMA: Patent. Inflow: Negative. Veins: Negative, not tailored for evaluation. Review of the MIP images confirms the above findings. NON-VASCULAR HEPATOBILIARY: Subcentimeter gallstone without CT findings of acute cholecystitis. Negative liver. PANCREAS: Normal. SPLEEN: Normal. ADRENALS/URINARY TRACT: Kidneys are orthotopic, demonstrating symmetric enhancement. No nephrolithiasis, hydronephrosis or solid renal masses. Bilateral renal cysts measuring to 4.3 cm on the LEFT. Too small to characterize hypodensities kidneys bilaterally. The unopacified ureters are normal in course and caliber. Urinary bladder is well distended and unremarkable. Normal adrenal glands. STOMACH/BOWEL: The stomach, small and large bowel are normal in course and caliber without inflammatory changes, sensitivity decreased without oral contrast. Mild sigmoid colonic diverticulosis. Moderate amount of retained large bowel stool. Normal appendix. VASCULAR/LYMPHATIC: No lymphadenopathy by CT size criteria. REPRODUCTIVE: Status post hysterectomy. OTHER: No intraperitoneal free fluid or free air. MUSCULOSKELETAL: Nonacute. Small fat containing umbilical hernia. Osteopenia. Mild old L2 and L3 compression fractures. Review of the MIP images confirms the above findings. IMPRESSION: CTA CHEST: 1. Subcentimeter ascending aortic outpouching concerning for partially thrombosed penetrating ulcer. 2. Long segment descending thoracic aorta intimal hematoma, potentially acute. 3. Mild cardiomegaly.  No acute pulmonary process. CTA ABDOMEN AND PELVIS: 1. No acute vascular process or or acute intra-abdominal/pelvic disease. 2. Cholelithiasis without acute cholecystitis. Aortic Atherosclerosis (ICD10-I70.0). Electronically Signed: By: Awilda Metro M.D. On: 04/29/2017 03:05     Subjective: - no chest pain, shortness of breath, no abdominal pain, nausea or vomiting.   Discharge Exam: Vitals:   05/01/17 1200 05/01/17 1301  BP: (!) 111/53  Pulse:    Resp: 16   Temp:  97.8 F (36.6 C)  SpO2: 93%     General: Pt is alert, awake, not in acute distress Cardiovascular: RRR, S1/S2 +, no rubs, no gallops Respiratory: CTA bilaterally, no wheezing, no rhonchi Abdominal: Soft, NT, ND, bowel sounds + Extremities: no edema, no cyanosis    The results of significant diagnostics from this hospitalization (including imaging, microbiology, ancillary and laboratory) are listed below for reference.     Microbiology: Recent Results (from the past 240 hour(s))  MRSA PCR Screening     Status: None   Collection Time: 04/29/17  8:19 AM  Result Value Ref Range Status   MRSA by PCR NEGATIVE NEGATIVE Final    Comment:        The GeneXpert MRSA Assay (FDA approved for NASAL specimens only), is one component of a comprehensive MRSA colonization surveillance program. It is not intended to diagnose MRSA infection nor to guide or monitor treatment for MRSA infections.      Labs: BNP (last 3 results) Recent Labs    09/20/16 0502 04/28/17 2313  BNP 623.5* 169.5*   Basic Metabolic Panel: Recent Labs  Lab 04/28/17 2308 04/30/17 0253  NA 134* 134*  K 3.5 3.6  CL 101 100*  CO2 23 24  GLUCOSE 229* 162*  BUN 20 16  CREATININE 0.89 0.86  CALCIUM 8.6* 8.3*  MG  --  1.8  PHOS  --  3.0   Liver Function Tests: No results for input(s): AST, ALT, ALKPHOS, BILITOT, PROT, ALBUMIN in the last 168 hours. No results for input(s): LIPASE, AMYLASE in the last 168 hours. No results for input(s): AMMONIA in the last 168 hours. CBC: Recent Labs  Lab 04/28/17 2308 04/30/17 0253  WBC 7.8 8.7  NEUTROABS 3.8  --   HGB 13.0 12.9  HCT 39.4 38.0  MCV 101.0* 101.1*  PLT 181 170   Cardiac Enzymes: Recent Labs  Lab 04/29/17 0140 04/29/17 0857 04/29/17 1454  TROPONINI <0.03 <0.03 <0.03   BNP: Invalid input(s): POCBNP CBG: Recent Labs  Lab 05/01/17 0139 05/01/17 0425 05/01/17 0804 05/01/17 1154 05/01/17 1611  GLUCAP 171*  210* 158* 270* 297*   D-Dimer No results for input(s): DDIMER in the last 72 hours. Hgb A1c Recent Labs    04/29/17 0857  HGBA1C 8.0*   Lipid Profile Recent Labs    04/29/17 0457  CHOL 198  HDL 49  LDLCALC 124*  TRIG 123  CHOLHDL 4.0   Thyroid function studies No results for input(s): TSH, T4TOTAL, T3FREE, THYROIDAB in the last 72 hours.  Invalid input(s): FREET3 Anemia work up No results for input(s): VITAMINB12, FOLATE, FERRITIN, TIBC, IRON, RETICCTPCT in the last 72 hours. Urinalysis    Component Value Date/Time   COLORURINE AMBER (A) 09/20/2016 0017   APPEARANCEUR TURBID (A) 09/20/2016 0017   LABSPEC 1.014 09/20/2016 0017   PHURINE 5.0 09/20/2016 0017   GLUCOSEU NEGATIVE 09/20/2016 0017   HGBUR SMALL (A) 09/20/2016 0017   BILIRUBINUR NEGATIVE 09/20/2016 0017   KETONESUR 20 (A) 09/20/2016 0017   PROTEINUR 100 (A) 09/20/2016 0017   UROBILINOGEN 0.2 04/18/2009 0212   NITRITE NEGATIVE 09/20/2016 0017   LEUKOCYTESUR LARGE (A) 09/20/2016 0017   Sepsis Labs Invalid input(s): PROCALCITONIN,  WBC,  LACTICIDVEN   Time coordinating discharge: 40 minutes  SIGNED:  Pamella Pert, MD  [facility] 05/01/2017, 4:39 PM Pager [phone]  If 7PM-7AM, please contact night-coverage [email or web address] Password [email or web address]

## 2017-05-01 NOTE — Care Management Note (Addendum)
Case Management Note  Patient Details  Name: [name] MRN: [id number] Date of Birth: 07/11/1923  Subjective/Objective:   Pt admitted with CP           Action/Plan:    PTA independent from home alone.  Pt has support of daughters that stay close by and check on her daily.  [facility]  (pt states she may have transportation issues so she prefers HH instead of outpt)- [facility] and [facility].  Pt request bedside commode - [facility] - agency contacted and referral accepted.  CM provided pt meals on wheels information - daughters will also help pt with meals as needed.  Pt states she has PCP and denied barriers with obtaining/paying for medications.  Daughters will provide 24/7 supervision at discharge.   Expected Discharge Date:  05/01/17               Expected Discharge Plan:  Home w Home Health Services  In-House Referral:     Discharge planning Services  CM Consult  Post Acute Care Choice:    Choice offered to:     DME Arranged:  3-N-1 DME Agency:  [facility]  [facility]  [facility]  Status of Service:  Completed, signed off  If discussed at Long Length of Stay Meetings, dates discussed:    Additional Comments:  Cherylann ParrClaxton, Anette Barra S, RN 05/01/2017, 2:04 PM

## 2017-05-08 DIAGNOSIS — E119 Type 2 diabetes mellitus without complications: Secondary | ICD-10-CM | POA: Diagnosis not present

## 2017-05-08 DIAGNOSIS — M810 Age-related osteoporosis without current pathological fracture: Secondary | ICD-10-CM | POA: Diagnosis not present

## 2017-05-08 DIAGNOSIS — I7101 Dissection of thoracic aorta: Secondary | ICD-10-CM | POA: Diagnosis not present

## 2017-05-08 DIAGNOSIS — Z7984 Long term (current) use of oral hypoglycemic drugs: Secondary | ICD-10-CM | POA: Diagnosis not present

## 2017-05-08 DIAGNOSIS — I5032 Chronic diastolic (congestive) heart failure: Secondary | ICD-10-CM | POA: Diagnosis not present

## 2017-05-08 DIAGNOSIS — E785 Hyperlipidemia, unspecified: Secondary | ICD-10-CM | POA: Diagnosis not present

## 2017-05-08 DIAGNOSIS — I110 Hypertensive heart disease with heart failure: Secondary | ICD-10-CM | POA: Diagnosis not present

## 2017-05-08 DIAGNOSIS — J449 Chronic obstructive pulmonary disease, unspecified: Secondary | ICD-10-CM | POA: Diagnosis not present

## 2017-05-10 DIAGNOSIS — I10 Essential (primary) hypertension: Secondary | ICD-10-CM | POA: Diagnosis not present

## 2017-05-10 DIAGNOSIS — I714 Abdominal aortic aneurysm, without rupture: Secondary | ICD-10-CM | POA: Diagnosis not present

## 2017-05-10 DIAGNOSIS — I7100 Dissection of unspecified site of aorta: Secondary | ICD-10-CM | POA: Diagnosis not present

## 2017-05-11 DIAGNOSIS — I7101 Dissection of thoracic aorta: Secondary | ICD-10-CM | POA: Diagnosis not present

## 2017-05-11 DIAGNOSIS — M810 Age-related osteoporosis without current pathological fracture: Secondary | ICD-10-CM | POA: Diagnosis not present

## 2017-05-11 DIAGNOSIS — J449 Chronic obstructive pulmonary disease, unspecified: Secondary | ICD-10-CM | POA: Diagnosis not present

## 2017-05-11 DIAGNOSIS — I110 Hypertensive heart disease with heart failure: Secondary | ICD-10-CM | POA: Diagnosis not present

## 2017-05-11 DIAGNOSIS — E119 Type 2 diabetes mellitus without complications: Secondary | ICD-10-CM | POA: Diagnosis not present

## 2017-05-11 DIAGNOSIS — I5032 Chronic diastolic (congestive) heart failure: Secondary | ICD-10-CM | POA: Diagnosis not present

## 2017-05-16 DIAGNOSIS — I7101 Dissection of thoracic aorta: Secondary | ICD-10-CM | POA: Diagnosis not present

## 2017-05-16 DIAGNOSIS — I110 Hypertensive heart disease with heart failure: Secondary | ICD-10-CM | POA: Diagnosis not present

## 2017-05-16 DIAGNOSIS — J449 Chronic obstructive pulmonary disease, unspecified: Secondary | ICD-10-CM | POA: Diagnosis not present

## 2017-05-16 DIAGNOSIS — I5032 Chronic diastolic (congestive) heart failure: Secondary | ICD-10-CM | POA: Diagnosis not present

## 2017-05-16 DIAGNOSIS — R300 Dysuria: Secondary | ICD-10-CM | POA: Diagnosis not present

## 2017-05-16 DIAGNOSIS — E119 Type 2 diabetes mellitus without complications: Secondary | ICD-10-CM | POA: Diagnosis not present

## 2017-05-16 DIAGNOSIS — M810 Age-related osteoporosis without current pathological fracture: Secondary | ICD-10-CM | POA: Diagnosis not present

## 2017-05-17 DIAGNOSIS — J449 Chronic obstructive pulmonary disease, unspecified: Secondary | ICD-10-CM | POA: Diagnosis not present

## 2017-05-17 DIAGNOSIS — E119 Type 2 diabetes mellitus without complications: Secondary | ICD-10-CM | POA: Diagnosis not present

## 2017-05-17 DIAGNOSIS — I5032 Chronic diastolic (congestive) heart failure: Secondary | ICD-10-CM | POA: Diagnosis not present

## 2017-05-17 DIAGNOSIS — I110 Hypertensive heart disease with heart failure: Secondary | ICD-10-CM | POA: Diagnosis not present

## 2017-05-17 DIAGNOSIS — M810 Age-related osteoporosis without current pathological fracture: Secondary | ICD-10-CM | POA: Diagnosis not present

## 2017-05-17 DIAGNOSIS — I7101 Dissection of thoracic aorta: Secondary | ICD-10-CM | POA: Diagnosis not present

## 2017-05-23 DIAGNOSIS — I5032 Chronic diastolic (congestive) heart failure: Secondary | ICD-10-CM | POA: Diagnosis not present

## 2017-05-23 DIAGNOSIS — J449 Chronic obstructive pulmonary disease, unspecified: Secondary | ICD-10-CM | POA: Diagnosis not present

## 2017-05-23 DIAGNOSIS — E119 Type 2 diabetes mellitus without complications: Secondary | ICD-10-CM | POA: Diagnosis not present

## 2017-05-23 DIAGNOSIS — I7101 Dissection of thoracic aorta: Secondary | ICD-10-CM | POA: Diagnosis not present

## 2017-05-23 DIAGNOSIS — I110 Hypertensive heart disease with heart failure: Secondary | ICD-10-CM | POA: Diagnosis not present

## 2017-05-23 DIAGNOSIS — M810 Age-related osteoporosis without current pathological fracture: Secondary | ICD-10-CM | POA: Diagnosis not present

## 2017-05-25 DIAGNOSIS — I7101 Dissection of thoracic aorta: Secondary | ICD-10-CM | POA: Diagnosis not present

## 2017-05-25 DIAGNOSIS — I5032 Chronic diastolic (congestive) heart failure: Secondary | ICD-10-CM | POA: Diagnosis not present

## 2017-05-25 DIAGNOSIS — E119 Type 2 diabetes mellitus without complications: Secondary | ICD-10-CM | POA: Diagnosis not present

## 2017-05-25 DIAGNOSIS — I110 Hypertensive heart disease with heart failure: Secondary | ICD-10-CM | POA: Diagnosis not present

## 2017-05-25 DIAGNOSIS — J449 Chronic obstructive pulmonary disease, unspecified: Secondary | ICD-10-CM | POA: Diagnosis not present

## 2017-05-25 DIAGNOSIS — M810 Age-related osteoporosis without current pathological fracture: Secondary | ICD-10-CM | POA: Diagnosis not present

## 2017-05-30 DIAGNOSIS — I110 Hypertensive heart disease with heart failure: Secondary | ICD-10-CM | POA: Diagnosis not present

## 2017-05-30 DIAGNOSIS — I7101 Dissection of thoracic aorta: Secondary | ICD-10-CM | POA: Diagnosis not present

## 2017-05-30 DIAGNOSIS — M810 Age-related osteoporosis without current pathological fracture: Secondary | ICD-10-CM | POA: Diagnosis not present

## 2017-05-30 DIAGNOSIS — E119 Type 2 diabetes mellitus without complications: Secondary | ICD-10-CM | POA: Diagnosis not present

## 2017-05-30 DIAGNOSIS — J449 Chronic obstructive pulmonary disease, unspecified: Secondary | ICD-10-CM | POA: Diagnosis not present

## 2017-05-30 DIAGNOSIS — I5032 Chronic diastolic (congestive) heart failure: Secondary | ICD-10-CM | POA: Diagnosis not present

## 2017-05-31 DIAGNOSIS — J449 Chronic obstructive pulmonary disease, unspecified: Secondary | ICD-10-CM | POA: Diagnosis not present

## 2017-05-31 DIAGNOSIS — E119 Type 2 diabetes mellitus without complications: Secondary | ICD-10-CM | POA: Diagnosis not present

## 2017-05-31 DIAGNOSIS — I110 Hypertensive heart disease with heart failure: Secondary | ICD-10-CM | POA: Diagnosis not present

## 2017-05-31 DIAGNOSIS — I7101 Dissection of thoracic aorta: Secondary | ICD-10-CM | POA: Diagnosis not present

## 2017-05-31 DIAGNOSIS — M810 Age-related osteoporosis without current pathological fracture: Secondary | ICD-10-CM | POA: Diagnosis not present

## 2017-05-31 DIAGNOSIS — I5032 Chronic diastolic (congestive) heart failure: Secondary | ICD-10-CM | POA: Diagnosis not present

## 2017-06-01 DIAGNOSIS — J449 Chronic obstructive pulmonary disease, unspecified: Secondary | ICD-10-CM | POA: Diagnosis not present

## 2017-06-01 DIAGNOSIS — I5032 Chronic diastolic (congestive) heart failure: Secondary | ICD-10-CM | POA: Diagnosis not present

## 2017-06-01 DIAGNOSIS — M810 Age-related osteoporosis without current pathological fracture: Secondary | ICD-10-CM | POA: Diagnosis not present

## 2017-06-01 DIAGNOSIS — I110 Hypertensive heart disease with heart failure: Secondary | ICD-10-CM | POA: Diagnosis not present

## 2017-06-01 DIAGNOSIS — E119 Type 2 diabetes mellitus without complications: Secondary | ICD-10-CM | POA: Diagnosis not present

## 2017-06-01 DIAGNOSIS — I7101 Dissection of thoracic aorta: Secondary | ICD-10-CM | POA: Diagnosis not present

## 2017-06-13 DIAGNOSIS — E871 Hypo-osmolality and hyponatremia: Secondary | ICD-10-CM | POA: Diagnosis not present

## 2017-06-14 DIAGNOSIS — I7101 Dissection of thoracic aorta: Secondary | ICD-10-CM | POA: Diagnosis not present

## 2017-06-14 DIAGNOSIS — E119 Type 2 diabetes mellitus without complications: Secondary | ICD-10-CM | POA: Diagnosis not present

## 2017-06-14 DIAGNOSIS — M810 Age-related osteoporosis without current pathological fracture: Secondary | ICD-10-CM | POA: Diagnosis not present

## 2017-06-14 DIAGNOSIS — I5032 Chronic diastolic (congestive) heart failure: Secondary | ICD-10-CM | POA: Diagnosis not present

## 2017-06-14 DIAGNOSIS — I110 Hypertensive heart disease with heart failure: Secondary | ICD-10-CM | POA: Diagnosis not present

## 2017-06-14 DIAGNOSIS — J449 Chronic obstructive pulmonary disease, unspecified: Secondary | ICD-10-CM | POA: Diagnosis not present

## 2017-06-22 DIAGNOSIS — M810 Age-related osteoporosis without current pathological fracture: Secondary | ICD-10-CM | POA: Diagnosis not present

## 2017-06-22 DIAGNOSIS — J449 Chronic obstructive pulmonary disease, unspecified: Secondary | ICD-10-CM | POA: Diagnosis not present

## 2017-06-22 DIAGNOSIS — I5032 Chronic diastolic (congestive) heart failure: Secondary | ICD-10-CM | POA: Diagnosis not present

## 2017-06-22 DIAGNOSIS — I110 Hypertensive heart disease with heart failure: Secondary | ICD-10-CM | POA: Diagnosis not present

## 2017-06-22 DIAGNOSIS — I7101 Dissection of thoracic aorta: Secondary | ICD-10-CM | POA: Diagnosis not present

## 2017-06-22 DIAGNOSIS — E119 Type 2 diabetes mellitus without complications: Secondary | ICD-10-CM | POA: Diagnosis not present

## 2017-06-29 DIAGNOSIS — M810 Age-related osteoporosis without current pathological fracture: Secondary | ICD-10-CM | POA: Diagnosis not present

## 2017-06-29 DIAGNOSIS — I5032 Chronic diastolic (congestive) heart failure: Secondary | ICD-10-CM | POA: Diagnosis not present

## 2017-06-29 DIAGNOSIS — E119 Type 2 diabetes mellitus without complications: Secondary | ICD-10-CM | POA: Diagnosis not present

## 2017-06-29 DIAGNOSIS — I110 Hypertensive heart disease with heart failure: Secondary | ICD-10-CM | POA: Diagnosis not present

## 2017-06-29 DIAGNOSIS — J449 Chronic obstructive pulmonary disease, unspecified: Secondary | ICD-10-CM | POA: Diagnosis not present

## 2017-06-29 DIAGNOSIS — I7101 Dissection of thoracic aorta: Secondary | ICD-10-CM | POA: Diagnosis not present

## 2017-07-03 DIAGNOSIS — J449 Chronic obstructive pulmonary disease, unspecified: Secondary | ICD-10-CM | POA: Diagnosis not present

## 2017-07-03 DIAGNOSIS — E119 Type 2 diabetes mellitus without complications: Secondary | ICD-10-CM | POA: Diagnosis not present

## 2017-07-03 DIAGNOSIS — I110 Hypertensive heart disease with heart failure: Secondary | ICD-10-CM | POA: Diagnosis not present

## 2017-07-03 DIAGNOSIS — I7101 Dissection of thoracic aorta: Secondary | ICD-10-CM | POA: Diagnosis not present

## 2017-07-03 DIAGNOSIS — M810 Age-related osteoporosis without current pathological fracture: Secondary | ICD-10-CM | POA: Diagnosis not present

## 2017-07-03 DIAGNOSIS — I5032 Chronic diastolic (congestive) heart failure: Secondary | ICD-10-CM | POA: Diagnosis not present

## 2017-09-20 DIAGNOSIS — Z7984 Long term (current) use of oral hypoglycemic drugs: Secondary | ICD-10-CM | POA: Diagnosis not present

## 2017-09-20 DIAGNOSIS — I700 Atherosclerosis of aorta: Secondary | ICD-10-CM | POA: Diagnosis not present

## 2017-09-20 DIAGNOSIS — J449 Chronic obstructive pulmonary disease, unspecified: Secondary | ICD-10-CM | POA: Diagnosis not present

## 2017-09-20 DIAGNOSIS — I10 Essential (primary) hypertension: Secondary | ICD-10-CM | POA: Diagnosis not present

## 2017-09-20 DIAGNOSIS — I739 Peripheral vascular disease, unspecified: Secondary | ICD-10-CM | POA: Diagnosis not present

## 2017-09-20 DIAGNOSIS — E11319 Type 2 diabetes mellitus with unspecified diabetic retinopathy without macular edema: Secondary | ICD-10-CM | POA: Diagnosis not present

## 2017-09-20 DIAGNOSIS — E1151 Type 2 diabetes mellitus with diabetic peripheral angiopathy without gangrene: Secondary | ICD-10-CM | POA: Diagnosis not present

## 2017-09-20 DIAGNOSIS — E782 Mixed hyperlipidemia: Secondary | ICD-10-CM | POA: Diagnosis not present

## 2017-11-28 DIAGNOSIS — R413 Other amnesia: Secondary | ICD-10-CM | POA: Diagnosis not present

## 2017-11-28 DIAGNOSIS — N3000 Acute cystitis without hematuria: Secondary | ICD-10-CM | POA: Diagnosis not present

## 2017-11-28 DIAGNOSIS — R300 Dysuria: Secondary | ICD-10-CM | POA: Diagnosis not present

## 2018-02-01 DIAGNOSIS — F0390 Unspecified dementia without behavioral disturbance: Secondary | ICD-10-CM | POA: Diagnosis not present

## 2018-02-01 DIAGNOSIS — M810 Age-related osteoporosis without current pathological fracture: Secondary | ICD-10-CM | POA: Diagnosis not present

## 2018-02-01 DIAGNOSIS — S81802A Unspecified open wound, left lower leg, initial encounter: Secondary | ICD-10-CM | POA: Diagnosis not present

## 2018-03-11 DIAGNOSIS — Z23 Encounter for immunization: Secondary | ICD-10-CM | POA: Diagnosis not present

## 2018-03-26 DIAGNOSIS — M810 Age-related osteoporosis without current pathological fracture: Secondary | ICD-10-CM | POA: Diagnosis not present

## 2018-03-26 DIAGNOSIS — I739 Peripheral vascular disease, unspecified: Secondary | ICD-10-CM | POA: Diagnosis not present

## 2018-03-26 DIAGNOSIS — Z0000 Encounter for general adult medical examination without abnormal findings: Secondary | ICD-10-CM | POA: Diagnosis not present

## 2018-03-26 DIAGNOSIS — F0390 Unspecified dementia without behavioral disturbance: Secondary | ICD-10-CM | POA: Diagnosis not present

## 2018-03-26 DIAGNOSIS — I10 Essential (primary) hypertension: Secondary | ICD-10-CM | POA: Diagnosis not present

## 2018-03-26 DIAGNOSIS — Z7984 Long term (current) use of oral hypoglycemic drugs: Secondary | ICD-10-CM | POA: Diagnosis not present

## 2018-03-26 DIAGNOSIS — E11319 Type 2 diabetes mellitus with unspecified diabetic retinopathy without macular edema: Secondary | ICD-10-CM | POA: Diagnosis not present

## 2018-03-26 DIAGNOSIS — J449 Chronic obstructive pulmonary disease, unspecified: Secondary | ICD-10-CM | POA: Diagnosis not present

## 2018-03-26 DIAGNOSIS — E782 Mixed hyperlipidemia: Secondary | ICD-10-CM | POA: Diagnosis not present

## 2018-03-26 DIAGNOSIS — I700 Atherosclerosis of aorta: Secondary | ICD-10-CM | POA: Diagnosis not present

## 2018-03-26 DIAGNOSIS — E1151 Type 2 diabetes mellitus with diabetic peripheral angiopathy without gangrene: Secondary | ICD-10-CM | POA: Diagnosis not present

## 2018-06-11 DIAGNOSIS — H04123 Dry eye syndrome of bilateral lacrimal glands: Secondary | ICD-10-CM | POA: Diagnosis not present

## 2018-06-11 DIAGNOSIS — E119 Type 2 diabetes mellitus without complications: Secondary | ICD-10-CM | POA: Diagnosis not present

## 2018-06-11 DIAGNOSIS — H26492 Other secondary cataract, left eye: Secondary | ICD-10-CM | POA: Diagnosis not present

## 2018-06-11 DIAGNOSIS — H52203 Unspecified astigmatism, bilateral: Secondary | ICD-10-CM | POA: Diagnosis not present

## 2018-06-25 DIAGNOSIS — L97909 Non-pressure chronic ulcer of unspecified part of unspecified lower leg with unspecified severity: Secondary | ICD-10-CM | POA: Diagnosis not present

## 2018-06-25 DIAGNOSIS — R296 Repeated falls: Secondary | ICD-10-CM | POA: Diagnosis not present

## 2018-06-25 DIAGNOSIS — I10 Essential (primary) hypertension: Secondary | ICD-10-CM | POA: Diagnosis not present

## 2018-06-25 DIAGNOSIS — E1151 Type 2 diabetes mellitus with diabetic peripheral angiopathy without gangrene: Secondary | ICD-10-CM | POA: Diagnosis not present

## 2018-06-25 DIAGNOSIS — R32 Unspecified urinary incontinence: Secondary | ICD-10-CM | POA: Diagnosis not present

## 2018-06-25 DIAGNOSIS — Z7984 Long term (current) use of oral hypoglycemic drugs: Secondary | ICD-10-CM | POA: Diagnosis not present

## 2018-06-25 DIAGNOSIS — J449 Chronic obstructive pulmonary disease, unspecified: Secondary | ICD-10-CM | POA: Diagnosis not present

## 2018-06-25 DIAGNOSIS — E782 Mixed hyperlipidemia: Secondary | ICD-10-CM | POA: Diagnosis not present

## 2018-06-25 DIAGNOSIS — F0390 Unspecified dementia without behavioral disturbance: Secondary | ICD-10-CM | POA: Diagnosis not present

## 2018-06-25 DIAGNOSIS — I700 Atherosclerosis of aorta: Secondary | ICD-10-CM | POA: Diagnosis not present

## 2018-06-25 DIAGNOSIS — I739 Peripheral vascular disease, unspecified: Secondary | ICD-10-CM | POA: Diagnosis not present

## 2018-06-29 DIAGNOSIS — L97221 Non-pressure chronic ulcer of left calf limited to breakdown of skin: Secondary | ICD-10-CM | POA: Diagnosis not present

## 2018-06-29 DIAGNOSIS — E1151 Type 2 diabetes mellitus with diabetic peripheral angiopathy without gangrene: Secondary | ICD-10-CM | POA: Diagnosis not present

## 2018-06-29 DIAGNOSIS — Z7984 Long term (current) use of oral hypoglycemic drugs: Secondary | ICD-10-CM | POA: Diagnosis not present

## 2018-06-29 DIAGNOSIS — R296 Repeated falls: Secondary | ICD-10-CM | POA: Diagnosis not present

## 2018-06-29 DIAGNOSIS — Z9181 History of falling: Secondary | ICD-10-CM | POA: Diagnosis not present

## 2018-06-29 DIAGNOSIS — F0390 Unspecified dementia without behavioral disturbance: Secondary | ICD-10-CM | POA: Diagnosis not present

## 2018-07-01 DIAGNOSIS — Z9181 History of falling: Secondary | ICD-10-CM | POA: Diagnosis not present

## 2018-07-01 DIAGNOSIS — Z7984 Long term (current) use of oral hypoglycemic drugs: Secondary | ICD-10-CM | POA: Diagnosis not present

## 2018-07-01 DIAGNOSIS — E1151 Type 2 diabetes mellitus with diabetic peripheral angiopathy without gangrene: Secondary | ICD-10-CM | POA: Diagnosis not present

## 2018-07-01 DIAGNOSIS — L97221 Non-pressure chronic ulcer of left calf limited to breakdown of skin: Secondary | ICD-10-CM | POA: Diagnosis not present

## 2018-07-01 DIAGNOSIS — F0390 Unspecified dementia without behavioral disturbance: Secondary | ICD-10-CM | POA: Diagnosis not present

## 2018-07-01 DIAGNOSIS — R296 Repeated falls: Secondary | ICD-10-CM | POA: Diagnosis not present

## 2018-07-02 DIAGNOSIS — R296 Repeated falls: Secondary | ICD-10-CM | POA: Diagnosis not present

## 2018-07-02 DIAGNOSIS — E1151 Type 2 diabetes mellitus with diabetic peripheral angiopathy without gangrene: Secondary | ICD-10-CM | POA: Diagnosis not present

## 2018-07-02 DIAGNOSIS — Z9181 History of falling: Secondary | ICD-10-CM | POA: Diagnosis not present

## 2018-07-02 DIAGNOSIS — L97221 Non-pressure chronic ulcer of left calf limited to breakdown of skin: Secondary | ICD-10-CM | POA: Diagnosis not present

## 2018-07-02 DIAGNOSIS — Z7984 Long term (current) use of oral hypoglycemic drugs: Secondary | ICD-10-CM | POA: Diagnosis not present

## 2018-07-02 DIAGNOSIS — F0390 Unspecified dementia without behavioral disturbance: Secondary | ICD-10-CM | POA: Diagnosis not present

## 2018-07-03 DIAGNOSIS — Z9181 History of falling: Secondary | ICD-10-CM | POA: Diagnosis not present

## 2018-07-03 DIAGNOSIS — Z7984 Long term (current) use of oral hypoglycemic drugs: Secondary | ICD-10-CM | POA: Diagnosis not present

## 2018-07-03 DIAGNOSIS — F0390 Unspecified dementia without behavioral disturbance: Secondary | ICD-10-CM | POA: Diagnosis not present

## 2018-07-03 DIAGNOSIS — R296 Repeated falls: Secondary | ICD-10-CM | POA: Diagnosis not present

## 2018-07-03 DIAGNOSIS — L97221 Non-pressure chronic ulcer of left calf limited to breakdown of skin: Secondary | ICD-10-CM | POA: Diagnosis not present

## 2018-07-03 DIAGNOSIS — E1151 Type 2 diabetes mellitus with diabetic peripheral angiopathy without gangrene: Secondary | ICD-10-CM | POA: Diagnosis not present

## 2018-07-04 DIAGNOSIS — E1151 Type 2 diabetes mellitus with diabetic peripheral angiopathy without gangrene: Secondary | ICD-10-CM | POA: Diagnosis not present

## 2018-07-04 DIAGNOSIS — F0390 Unspecified dementia without behavioral disturbance: Secondary | ICD-10-CM | POA: Diagnosis not present

## 2018-07-04 DIAGNOSIS — Z9181 History of falling: Secondary | ICD-10-CM | POA: Diagnosis not present

## 2018-07-04 DIAGNOSIS — R296 Repeated falls: Secondary | ICD-10-CM | POA: Diagnosis not present

## 2018-07-04 DIAGNOSIS — Z7984 Long term (current) use of oral hypoglycemic drugs: Secondary | ICD-10-CM | POA: Diagnosis not present

## 2018-07-04 DIAGNOSIS — L97221 Non-pressure chronic ulcer of left calf limited to breakdown of skin: Secondary | ICD-10-CM | POA: Diagnosis not present

## 2018-07-10 DIAGNOSIS — F0390 Unspecified dementia without behavioral disturbance: Secondary | ICD-10-CM | POA: Diagnosis not present

## 2018-07-10 DIAGNOSIS — E1151 Type 2 diabetes mellitus with diabetic peripheral angiopathy without gangrene: Secondary | ICD-10-CM | POA: Diagnosis not present

## 2018-07-10 DIAGNOSIS — Z9181 History of falling: Secondary | ICD-10-CM | POA: Diagnosis not present

## 2018-07-10 DIAGNOSIS — R296 Repeated falls: Secondary | ICD-10-CM | POA: Diagnosis not present

## 2018-07-10 DIAGNOSIS — L97221 Non-pressure chronic ulcer of left calf limited to breakdown of skin: Secondary | ICD-10-CM | POA: Diagnosis not present

## 2018-07-10 DIAGNOSIS — Z7984 Long term (current) use of oral hypoglycemic drugs: Secondary | ICD-10-CM | POA: Diagnosis not present

## 2018-07-11 DIAGNOSIS — R296 Repeated falls: Secondary | ICD-10-CM | POA: Diagnosis not present

## 2018-07-11 DIAGNOSIS — E1151 Type 2 diabetes mellitus with diabetic peripheral angiopathy without gangrene: Secondary | ICD-10-CM | POA: Diagnosis not present

## 2018-07-11 DIAGNOSIS — Z7984 Long term (current) use of oral hypoglycemic drugs: Secondary | ICD-10-CM | POA: Diagnosis not present

## 2018-07-11 DIAGNOSIS — Z9181 History of falling: Secondary | ICD-10-CM | POA: Diagnosis not present

## 2018-07-11 DIAGNOSIS — L97221 Non-pressure chronic ulcer of left calf limited to breakdown of skin: Secondary | ICD-10-CM | POA: Diagnosis not present

## 2018-07-11 DIAGNOSIS — F0390 Unspecified dementia without behavioral disturbance: Secondary | ICD-10-CM | POA: Diagnosis not present

## 2018-07-13 DIAGNOSIS — E1151 Type 2 diabetes mellitus with diabetic peripheral angiopathy without gangrene: Secondary | ICD-10-CM | POA: Diagnosis not present

## 2018-07-13 DIAGNOSIS — F0390 Unspecified dementia without behavioral disturbance: Secondary | ICD-10-CM | POA: Diagnosis not present

## 2018-07-13 DIAGNOSIS — R296 Repeated falls: Secondary | ICD-10-CM | POA: Diagnosis not present

## 2018-07-13 DIAGNOSIS — Z7984 Long term (current) use of oral hypoglycemic drugs: Secondary | ICD-10-CM | POA: Diagnosis not present

## 2018-07-13 DIAGNOSIS — L97221 Non-pressure chronic ulcer of left calf limited to breakdown of skin: Secondary | ICD-10-CM | POA: Diagnosis not present

## 2018-07-13 DIAGNOSIS — Z9181 History of falling: Secondary | ICD-10-CM | POA: Diagnosis not present

## 2018-07-17 DIAGNOSIS — R296 Repeated falls: Secondary | ICD-10-CM | POA: Diagnosis not present

## 2018-07-17 DIAGNOSIS — E1151 Type 2 diabetes mellitus with diabetic peripheral angiopathy without gangrene: Secondary | ICD-10-CM | POA: Diagnosis not present

## 2018-07-17 DIAGNOSIS — Z7984 Long term (current) use of oral hypoglycemic drugs: Secondary | ICD-10-CM | POA: Diagnosis not present

## 2018-07-17 DIAGNOSIS — F0390 Unspecified dementia without behavioral disturbance: Secondary | ICD-10-CM | POA: Diagnosis not present

## 2018-07-17 DIAGNOSIS — L97221 Non-pressure chronic ulcer of left calf limited to breakdown of skin: Secondary | ICD-10-CM | POA: Diagnosis not present

## 2018-07-17 DIAGNOSIS — Z9181 History of falling: Secondary | ICD-10-CM | POA: Diagnosis not present

## 2018-07-18 DIAGNOSIS — E1151 Type 2 diabetes mellitus with diabetic peripheral angiopathy without gangrene: Secondary | ICD-10-CM | POA: Diagnosis not present

## 2018-07-18 DIAGNOSIS — F0390 Unspecified dementia without behavioral disturbance: Secondary | ICD-10-CM | POA: Diagnosis not present

## 2018-07-18 DIAGNOSIS — R296 Repeated falls: Secondary | ICD-10-CM | POA: Diagnosis not present

## 2018-07-18 DIAGNOSIS — Z7984 Long term (current) use of oral hypoglycemic drugs: Secondary | ICD-10-CM | POA: Diagnosis not present

## 2018-07-18 DIAGNOSIS — Z9181 History of falling: Secondary | ICD-10-CM | POA: Diagnosis not present

## 2018-07-18 DIAGNOSIS — L97221 Non-pressure chronic ulcer of left calf limited to breakdown of skin: Secondary | ICD-10-CM | POA: Diagnosis not present

## 2018-07-19 DIAGNOSIS — F0390 Unspecified dementia without behavioral disturbance: Secondary | ICD-10-CM | POA: Diagnosis not present

## 2018-07-19 DIAGNOSIS — Z7984 Long term (current) use of oral hypoglycemic drugs: Secondary | ICD-10-CM | POA: Diagnosis not present

## 2018-07-19 DIAGNOSIS — Z9181 History of falling: Secondary | ICD-10-CM | POA: Diagnosis not present

## 2018-07-19 DIAGNOSIS — E1151 Type 2 diabetes mellitus with diabetic peripheral angiopathy without gangrene: Secondary | ICD-10-CM | POA: Diagnosis not present

## 2018-07-19 DIAGNOSIS — R296 Repeated falls: Secondary | ICD-10-CM | POA: Diagnosis not present

## 2018-07-19 DIAGNOSIS — L97221 Non-pressure chronic ulcer of left calf limited to breakdown of skin: Secondary | ICD-10-CM | POA: Diagnosis not present

## 2018-07-20 DIAGNOSIS — L97221 Non-pressure chronic ulcer of left calf limited to breakdown of skin: Secondary | ICD-10-CM | POA: Diagnosis not present

## 2018-07-20 DIAGNOSIS — R296 Repeated falls: Secondary | ICD-10-CM | POA: Diagnosis not present

## 2018-07-20 DIAGNOSIS — Z9181 History of falling: Secondary | ICD-10-CM | POA: Diagnosis not present

## 2018-07-20 DIAGNOSIS — F0390 Unspecified dementia without behavioral disturbance: Secondary | ICD-10-CM | POA: Diagnosis not present

## 2018-07-20 DIAGNOSIS — E1151 Type 2 diabetes mellitus with diabetic peripheral angiopathy without gangrene: Secondary | ICD-10-CM | POA: Diagnosis not present

## 2018-07-20 DIAGNOSIS — Z7984 Long term (current) use of oral hypoglycemic drugs: Secondary | ICD-10-CM | POA: Diagnosis not present

## 2018-07-23 DIAGNOSIS — R296 Repeated falls: Secondary | ICD-10-CM | POA: Diagnosis not present

## 2018-07-23 DIAGNOSIS — F0390 Unspecified dementia without behavioral disturbance: Secondary | ICD-10-CM | POA: Diagnosis not present

## 2018-07-23 DIAGNOSIS — Z9181 History of falling: Secondary | ICD-10-CM | POA: Diagnosis not present

## 2018-07-23 DIAGNOSIS — L97221 Non-pressure chronic ulcer of left calf limited to breakdown of skin: Secondary | ICD-10-CM | POA: Diagnosis not present

## 2018-07-23 DIAGNOSIS — Z7984 Long term (current) use of oral hypoglycemic drugs: Secondary | ICD-10-CM | POA: Diagnosis not present

## 2018-07-23 DIAGNOSIS — E1151 Type 2 diabetes mellitus with diabetic peripheral angiopathy without gangrene: Secondary | ICD-10-CM | POA: Diagnosis not present

## 2018-07-24 DIAGNOSIS — Z7984 Long term (current) use of oral hypoglycemic drugs: Secondary | ICD-10-CM | POA: Diagnosis not present

## 2018-07-24 DIAGNOSIS — F0390 Unspecified dementia without behavioral disturbance: Secondary | ICD-10-CM | POA: Diagnosis not present

## 2018-07-24 DIAGNOSIS — Z9181 History of falling: Secondary | ICD-10-CM | POA: Diagnosis not present

## 2018-07-24 DIAGNOSIS — R296 Repeated falls: Secondary | ICD-10-CM | POA: Diagnosis not present

## 2018-07-24 DIAGNOSIS — L97221 Non-pressure chronic ulcer of left calf limited to breakdown of skin: Secondary | ICD-10-CM | POA: Diagnosis not present

## 2018-07-24 DIAGNOSIS — E1151 Type 2 diabetes mellitus with diabetic peripheral angiopathy without gangrene: Secondary | ICD-10-CM | POA: Diagnosis not present

## 2018-07-25 DIAGNOSIS — R296 Repeated falls: Secondary | ICD-10-CM | POA: Diagnosis not present

## 2018-07-25 DIAGNOSIS — L97221 Non-pressure chronic ulcer of left calf limited to breakdown of skin: Secondary | ICD-10-CM | POA: Diagnosis not present

## 2018-07-25 DIAGNOSIS — Z7984 Long term (current) use of oral hypoglycemic drugs: Secondary | ICD-10-CM | POA: Diagnosis not present

## 2018-07-25 DIAGNOSIS — E1151 Type 2 diabetes mellitus with diabetic peripheral angiopathy without gangrene: Secondary | ICD-10-CM | POA: Diagnosis not present

## 2018-07-25 DIAGNOSIS — Z9181 History of falling: Secondary | ICD-10-CM | POA: Diagnosis not present

## 2018-07-25 DIAGNOSIS — F0390 Unspecified dementia without behavioral disturbance: Secondary | ICD-10-CM | POA: Diagnosis not present

## 2018-07-27 DIAGNOSIS — R296 Repeated falls: Secondary | ICD-10-CM | POA: Diagnosis not present

## 2018-07-27 DIAGNOSIS — Z7984 Long term (current) use of oral hypoglycemic drugs: Secondary | ICD-10-CM | POA: Diagnosis not present

## 2018-07-27 DIAGNOSIS — Z9181 History of falling: Secondary | ICD-10-CM | POA: Diagnosis not present

## 2018-07-27 DIAGNOSIS — F0390 Unspecified dementia without behavioral disturbance: Secondary | ICD-10-CM | POA: Diagnosis not present

## 2018-07-27 DIAGNOSIS — E1151 Type 2 diabetes mellitus with diabetic peripheral angiopathy without gangrene: Secondary | ICD-10-CM | POA: Diagnosis not present

## 2018-07-27 DIAGNOSIS — L97221 Non-pressure chronic ulcer of left calf limited to breakdown of skin: Secondary | ICD-10-CM | POA: Diagnosis not present

## 2018-07-29 DIAGNOSIS — L97221 Non-pressure chronic ulcer of left calf limited to breakdown of skin: Secondary | ICD-10-CM | POA: Diagnosis not present

## 2018-07-29 DIAGNOSIS — E1151 Type 2 diabetes mellitus with diabetic peripheral angiopathy without gangrene: Secondary | ICD-10-CM | POA: Diagnosis not present

## 2018-07-29 DIAGNOSIS — Z7984 Long term (current) use of oral hypoglycemic drugs: Secondary | ICD-10-CM | POA: Diagnosis not present

## 2018-07-29 DIAGNOSIS — R296 Repeated falls: Secondary | ICD-10-CM | POA: Diagnosis not present

## 2018-07-29 DIAGNOSIS — Z9181 History of falling: Secondary | ICD-10-CM | POA: Diagnosis not present

## 2018-07-29 DIAGNOSIS — F0390 Unspecified dementia without behavioral disturbance: Secondary | ICD-10-CM | POA: Diagnosis not present

## 2018-07-31 DIAGNOSIS — F0390 Unspecified dementia without behavioral disturbance: Secondary | ICD-10-CM | POA: Diagnosis not present

## 2018-07-31 DIAGNOSIS — Z9181 History of falling: Secondary | ICD-10-CM | POA: Diagnosis not present

## 2018-07-31 DIAGNOSIS — L97221 Non-pressure chronic ulcer of left calf limited to breakdown of skin: Secondary | ICD-10-CM | POA: Diagnosis not present

## 2018-07-31 DIAGNOSIS — E1151 Type 2 diabetes mellitus with diabetic peripheral angiopathy without gangrene: Secondary | ICD-10-CM | POA: Diagnosis not present

## 2018-07-31 DIAGNOSIS — Z7984 Long term (current) use of oral hypoglycemic drugs: Secondary | ICD-10-CM | POA: Diagnosis not present

## 2018-07-31 DIAGNOSIS — R296 Repeated falls: Secondary | ICD-10-CM | POA: Diagnosis not present

## 2018-08-02 ENCOUNTER — Telehealth: Payer: Self-pay | Admitting: Licensed Clinical Social Worker

## 2018-08-02 NOTE — Telephone Encounter (Signed)
Palliative Care SW spoke with patient and scheduled a home visit for Monday, 2/10, at 11am.

## 2018-08-06 ENCOUNTER — Other Ambulatory Visit: Payer: Medicare Other | Admitting: Licensed Clinical Social Worker

## 2018-08-06 ENCOUNTER — Other Ambulatory Visit: Payer: Medicare Other | Admitting: *Deleted

## 2018-08-06 DIAGNOSIS — Z515 Encounter for palliative care: Secondary | ICD-10-CM

## 2018-08-06 DIAGNOSIS — E1151 Type 2 diabetes mellitus with diabetic peripheral angiopathy without gangrene: Secondary | ICD-10-CM | POA: Diagnosis not present

## 2018-08-06 DIAGNOSIS — R296 Repeated falls: Secondary | ICD-10-CM | POA: Diagnosis not present

## 2018-08-06 DIAGNOSIS — L97221 Non-pressure chronic ulcer of left calf limited to breakdown of skin: Secondary | ICD-10-CM | POA: Diagnosis not present

## 2018-08-06 DIAGNOSIS — Z7984 Long term (current) use of oral hypoglycemic drugs: Secondary | ICD-10-CM | POA: Diagnosis not present

## 2018-08-06 DIAGNOSIS — Z9181 History of falling: Secondary | ICD-10-CM | POA: Diagnosis not present

## 2018-08-06 DIAGNOSIS — F0390 Unspecified dementia without behavioral disturbance: Secondary | ICD-10-CM | POA: Diagnosis not present

## 2018-08-06 NOTE — Progress Notes (Addendum)
COMMUNITY PALLIATIVE CARE SW NOTE  PATIENT NAME: [name] DOB: 1923/12/10 MRN: [id number]  PRIMARY CARE PROVIDER: Mayra Neer, MD  RESPONSIBLE PARTY:  Acct ID - Guarantor Home Phone Work Phone Relationship Acct Type  [id number] [name] [id number] Self P/F     [place], [place],  [address]     PLAN OF CARE and INTERVENTIONS:             1. GOALS OF CARE/ ADVANCE CARE PLANNING:  Goal is for patient to remain in her home.  She  DNR. 2. SOCIAL/EMOTIONAL/SPIRITUAL ASSESSMENT/ INTERVENTIONS:  SW and Palliative Care RN, [name], met with patient in her home, along with her daughter, [name].  Patient had to be woken up with tactile prompts while sleeping in her chair.  She was open to answering questions, but repeated herself a few times.  [name] stays with patient during the day and her other daughter, [name], stays with patient at night.  Patient acknowledged having a few falls recently with no injuries. She met her husband while they both worked at [employer].  The couple moved to [place], where her husband was from and lived there for some time before returning to [place].  She has a dog that is a very good companion. 3. PATIENT/CAREGIVER EDUCATION/ COPING:  Patient copes by expressing her feelings openly. 4. PERSONAL EMERGENCY PLAN:  Patient's caregivers will call EMS as needed. 5. COMMUNITY RESOURCES COORDINATION/ HEALTH CARE NAVIGATION:  None. 6. FINANCIAL/LEGAL CONCERNS/INTERVENTIONS:  None expressed.     SOCIAL HX:  Social History   Tobacco Use  . Smoking status: Never Smoker  . Smokeless tobacco: Never Used  Substance Use Topics  . Alcohol use: No    CODE STATUS:  DNR  ADVANCED DIRECTIVES: N MOST FORM COMPLETE:  N HOSPICE EDUCATION PROVIDED:  N PPS:  Patient reports her appetite is normal.  She is able to stand and uses a walker. Duration of visit and documentation:  60 minutes.      Mikayla Corn Deshawn Skelley, LCSW

## 2018-08-08 NOTE — Progress Notes (Signed)
COMMUNITY PALLIATIVE CARE RN NOTE  PATIENT NAME: [name] DOB: [birth year] MRN: [id number]  PRIMARY CARE PROVIDER: Mayra Neer, MD  RESPONSIBLE PARTY:  Acct ID - Guarantor Home Phone Work Phone Relationship Acct Type  [id number] Mikayla [phone] Self P/F     [name], [place], [place] [address]    PLAN OF CARE and INTERVENTION:  1. ADVANCE CARE PLANNING/GOALS OF CARE: She wants to remain in her home for as long as possible 2. PATIENT/CAREGIVER EDUCATION: Explained Palliative Care Services, Reinforced Safe Mobility and Fall Prevention. 3. DISEASE STATUS: Met with patient and daughter [name] in patient's home. Patient initially sitting up in recliner sleeping soundly. I had to shake her leg to wake her up, as she would not arouse with verbal stimulation only. She denies pain. Forgetful at times and repeats statements. She is ambulatory with a walker, but is a high fall risk. She says that when she falls, she tends to fall backwards without warning. Last fall about a month ago. No apparent injuries. She spends most of her day sitting in a recliner. She enjoys reading, but it usually causes her to fall asleep. She also watches TV on occasion. She speaks about the difficulty in not being able to do things like she wants, such as household chores. Daughter reports that she is sleeping most of the day and all night. Her intake is normal. Her CBG was 197 this am. Controlled with Metformin BID. She takes her pills with water without difficulty. No dysphagia reported. She is continent of both bowel and bladder. She welcomes future Palliative Care visits. Will continue to monitor.  HISTORY OF PRESENT ILLNESS:  This is a 85 yo female who resides at home. Her daughter, [name] stays with patient during the day and daughter, [name] during the night. Palliative Care Team asked to follow patient for symptom management. Will visit patient monthly and PRN.  CODE STATUS:  DNR ADVANCED DIRECTIVES: N MOST FORM: no PPS: 50%   PHYSICAL EXAM:   VITALS: Today's Vitals   08/06/18 1149  BP: (!) 102/58  Pulse: (!) 50  Resp: 16  Temp: (!) 97.5 F (36.4 C)  TempSrc: Temporal  SpO2: 90%  PainSc: 0-No pain    LUNGS: clear to auscultation  CARDIAC: Cor RRR EXTREMITIES: No edema SKIN: Thin, dry skin; Exposed skin is dry and intact  NEURO: Alert and oriented to person/place, forgetful, generalized weakness, ambulatory with walker   (Duration of visit and documentation 75 minutes)    Mikayla Eastern, RN, BSN

## 2018-08-10 DIAGNOSIS — Z7984 Long term (current) use of oral hypoglycemic drugs: Secondary | ICD-10-CM | POA: Diagnosis not present

## 2018-08-10 DIAGNOSIS — R296 Repeated falls: Secondary | ICD-10-CM | POA: Diagnosis not present

## 2018-08-10 DIAGNOSIS — F0390 Unspecified dementia without behavioral disturbance: Secondary | ICD-10-CM | POA: Diagnosis not present

## 2018-08-10 DIAGNOSIS — E1151 Type 2 diabetes mellitus with diabetic peripheral angiopathy without gangrene: Secondary | ICD-10-CM | POA: Diagnosis not present

## 2018-08-10 DIAGNOSIS — Z9181 History of falling: Secondary | ICD-10-CM | POA: Diagnosis not present

## 2018-08-10 DIAGNOSIS — L97221 Non-pressure chronic ulcer of left calf limited to breakdown of skin: Secondary | ICD-10-CM | POA: Diagnosis not present

## 2018-08-13 DIAGNOSIS — F0390 Unspecified dementia without behavioral disturbance: Secondary | ICD-10-CM | POA: Diagnosis not present

## 2018-08-13 DIAGNOSIS — R296 Repeated falls: Secondary | ICD-10-CM | POA: Diagnosis not present

## 2018-08-13 DIAGNOSIS — Z7984 Long term (current) use of oral hypoglycemic drugs: Secondary | ICD-10-CM | POA: Diagnosis not present

## 2018-08-13 DIAGNOSIS — E1151 Type 2 diabetes mellitus with diabetic peripheral angiopathy without gangrene: Secondary | ICD-10-CM | POA: Diagnosis not present

## 2018-08-13 DIAGNOSIS — L97221 Non-pressure chronic ulcer of left calf limited to breakdown of skin: Secondary | ICD-10-CM | POA: Diagnosis not present

## 2018-08-13 DIAGNOSIS — Z9181 History of falling: Secondary | ICD-10-CM | POA: Diagnosis not present

## 2018-08-16 DIAGNOSIS — Z7984 Long term (current) use of oral hypoglycemic drugs: Secondary | ICD-10-CM | POA: Diagnosis not present

## 2018-08-16 DIAGNOSIS — Z9181 History of falling: Secondary | ICD-10-CM | POA: Diagnosis not present

## 2018-08-16 DIAGNOSIS — F0390 Unspecified dementia without behavioral disturbance: Secondary | ICD-10-CM | POA: Diagnosis not present

## 2018-08-16 DIAGNOSIS — R296 Repeated falls: Secondary | ICD-10-CM | POA: Diagnosis not present

## 2018-08-16 DIAGNOSIS — L97221 Non-pressure chronic ulcer of left calf limited to breakdown of skin: Secondary | ICD-10-CM | POA: Diagnosis not present

## 2018-08-16 DIAGNOSIS — E1151 Type 2 diabetes mellitus with diabetic peripheral angiopathy without gangrene: Secondary | ICD-10-CM | POA: Diagnosis not present

## 2018-08-23 DIAGNOSIS — R296 Repeated falls: Secondary | ICD-10-CM | POA: Diagnosis not present

## 2018-08-23 DIAGNOSIS — Z7984 Long term (current) use of oral hypoglycemic drugs: Secondary | ICD-10-CM | POA: Diagnosis not present

## 2018-08-23 DIAGNOSIS — Z9181 History of falling: Secondary | ICD-10-CM | POA: Diagnosis not present

## 2018-08-23 DIAGNOSIS — F0390 Unspecified dementia without behavioral disturbance: Secondary | ICD-10-CM | POA: Diagnosis not present

## 2018-08-23 DIAGNOSIS — L97221 Non-pressure chronic ulcer of left calf limited to breakdown of skin: Secondary | ICD-10-CM | POA: Diagnosis not present

## 2018-08-23 DIAGNOSIS — E1151 Type 2 diabetes mellitus with diabetic peripheral angiopathy without gangrene: Secondary | ICD-10-CM | POA: Diagnosis not present

## 2018-08-27 DIAGNOSIS — F0390 Unspecified dementia without behavioral disturbance: Secondary | ICD-10-CM | POA: Diagnosis not present

## 2018-08-27 DIAGNOSIS — Z7984 Long term (current) use of oral hypoglycemic drugs: Secondary | ICD-10-CM | POA: Diagnosis not present

## 2018-08-27 DIAGNOSIS — R296 Repeated falls: Secondary | ICD-10-CM | POA: Diagnosis not present

## 2018-08-27 DIAGNOSIS — E1151 Type 2 diabetes mellitus with diabetic peripheral angiopathy without gangrene: Secondary | ICD-10-CM | POA: Diagnosis not present

## 2018-08-27 DIAGNOSIS — Z9181 History of falling: Secondary | ICD-10-CM | POA: Diagnosis not present

## 2018-08-27 DIAGNOSIS — L97221 Non-pressure chronic ulcer of left calf limited to breakdown of skin: Secondary | ICD-10-CM | POA: Diagnosis not present

## 2018-09-11 ENCOUNTER — Telehealth: Payer: Self-pay | Admitting: Licensed Clinical Social Worker

## 2018-09-11 DIAGNOSIS — R269 Unspecified abnormalities of gait and mobility: Secondary | ICD-10-CM | POA: Diagnosis not present

## 2018-09-11 DIAGNOSIS — Z9181 History of falling: Secondary | ICD-10-CM | POA: Diagnosis not present

## 2018-09-11 DIAGNOSIS — J449 Chronic obstructive pulmonary disease, unspecified: Secondary | ICD-10-CM | POA: Diagnosis not present

## 2018-09-11 NOTE — Telephone Encounter (Signed)
Palliative Care SW spoke with patient and scheduled a home visit for 9:30 on 3/18.

## 2018-09-12 ENCOUNTER — Other Ambulatory Visit: Payer: Medicare Other | Admitting: Licensed Clinical Social Worker

## 2018-09-12 ENCOUNTER — Other Ambulatory Visit: Payer: Medicare Other | Admitting: *Deleted

## 2018-09-12 ENCOUNTER — Other Ambulatory Visit: Payer: Self-pay

## 2018-09-12 DIAGNOSIS — Z515 Encounter for palliative care: Secondary | ICD-10-CM

## 2018-09-12 NOTE — Progress Notes (Signed)
COMMUNITY PALLIATIVE CARE SW NOTE  PATIENT NAME: [name] DOB: 1923/08/02 MRN: [id number]  PRIMARY CARE PROVIDER: Mayra Neer, MD  RESPONSIBLE PARTY:  Acct ID - Guarantor Home Phone Work Phone Relationship Acct Type  [id number] [name] Self P/F     [place], [place], [place] [address]     PLAN OF CARE and INTERVENTIONS:             1. GOALS OF CARE/ ADVANCE CARE PLANNING:  Patient's goal is to remain in her home.  She has a DNR. 2. SOCIAL/EMOTIONAL/SPIRITUAL ASSESSMENT/ INTERVENTIONS:  SW and Palliative Care RN, Daryl Eastern, met with patient and her daughter, [name], in patient's home.  She was alert and engaged in conversation.  She provided additional information about her previous marriages and work history.  She denied pain.  She has not had any falls in the past several weeks.  Her dog provides her with company.  Patient's daughter expressed no concerns at this time.  SW provided active listening and supportive counseling. 3. PATIENT/CAREGIVER EDUCATION/ COPING:  Patient expresses her feelings openly and uses humor to cope. 4. PERSONAL EMERGENCY PLAN:  Patient's caregivers will call EMS.  Patient will rest in her recliner when fatigued. 5. COMMUNITY RESOURCES COORDINATION/ HEALTH CARE NAVIGATION:  None. 6. FINANCIAL/LEGAL CONCERNS/INTERVENTIONS:  None.     SOCIAL HX:  Social History   Tobacco Use  . Smoking status: Never Smoker  . Smokeless tobacco: Never Used  Substance Use Topics  . Alcohol use: No    CODE STATUS:  DNR ADVANCED DIRECTIVES: N MOST FORM COMPLETE:  N HOSPICE EDUCATION PROVIDED: N PPS:  Patient states that her appetite is normal.  She ambulates with her walker and is able to stand independently. Duration of visit and documentation:  75 minutes.      Creola Corn Tamantha Saline, LCSW

## 2018-09-18 NOTE — Progress Notes (Signed)
COMMUNITY PALLIATIVE CARE RN NOTE  PATIENT NAME: [name] DOB: Nov 10, 1923 MRN: [id number]  PRIMARY CARE PROVIDER: Mayra Neer, MD  RESPONSIBLE PARTY:  Acct ID - Guarantor Home Phone Work Phone Relationship Acct Type  [id number] [name] Self P/F     [place], [place], [place] [address]    PLAN OF CARE and INTERVENTION:  1. ADVANCE CARE PLANNING/GOALS OF CARE: She wants to remain in her home as long as possible. She is a DNR. 2. PATIENT/CAREGIVER EDUCATION: Reinforced Safe Mobility 3. DISEASE STATUS: Joint visit made with Palliative Care SW, Lynn Duffy. Met with patient and daughter, [name], in her home. She is sitting up in her recliner awake and alert. Pleasant mood. She is talkative and stories are repetitive in nature. She is forgetful with poor short term memory. She denies pain. She remains ambulatory. She denies any falls since last visit a month ago. No skin issues. Her intake is normal. Remains able to take her medications without difficulty. Denies dysphagia. Her blood sugars have been stable. Remains continent of both bowel and bladder. She has no issues or concerns at this time. Will continue to monitor.   HISTORY OF PRESENT ILLNESS:  This is a 85 yo female who resides at home with her daughters as caregivers. Palliative Care team continues to follow patient. Next visit scheduled in 1 month.  CODE STATUS: DNR ADVANCED DIRECTIVES: N MOST FORM: no PPS: 50%   PHYSICAL EXAM:   LUNGS: clear to auscultation  CARDIAC: Cor RRR EXTREMITIES: No edema SKIN: Exposed skin is dry and intact; Denies any skin issues  NEURO: Alert and oriented x 2 (person/place), pleasant mood, forgetful, ambulatory with walker   (Duration of visit and documentation 60 minutes)    Daryl Eastern, RN BSN

## 2018-09-20 ENCOUNTER — Ambulatory Visit: Payer: Medicare Other | Admitting: Physical Therapy

## 2018-10-10 ENCOUNTER — Other Ambulatory Visit: Payer: Medicare Other | Admitting: *Deleted

## 2018-10-10 ENCOUNTER — Other Ambulatory Visit: Payer: Self-pay

## 2018-10-10 ENCOUNTER — Other Ambulatory Visit: Payer: Medicare Other | Admitting: Licensed Clinical Social Worker

## 2018-10-10 DIAGNOSIS — Z515 Encounter for palliative care: Secondary | ICD-10-CM

## 2018-10-11 NOTE — Progress Notes (Signed)
COMMUNITY PALLIATIVE CARE SW NOTE  PATIENT NAME: [name] DOB: 1923/12/02 MRN: [id number]  PRIMARY CARE PROVIDER: Lupita Raider, MD  RESPONSIBLE PARTY:  Acct ID - Guarantor Home Phone Work Phone Relationship Acct Type  [id number] Harrington Challenger* [phone] Self P/F     [address], [place], [place] [address]   Due to the COVID-19 crisis, this virtual check-in visit was done via telephone from my office and it was initiated and consent given by this patientand orfamily.  PLAN OF CARE and INTERVENTIONS:             1. GOALS OF CARE/ ADVANCE CARE PLANNING:  Goal is for patient to remain in her home.  Patient has a DNR. 2. SOCIAL/EMOTIONAL/SPIRITUAL ASSESSMENT/ INTERVENTIONS:  SW and Palliative Care RN, Candiss Norse, conducted a joint virtual check-in visit with patient and her daughter, [name].  Patient had difficulty hearing on the phone, and had her daughter take over the conversation.  [name] reported no concerns at this time.  Patient is remaining in her home with her dog.  She is not experiencing any pain. 3. PATIENT/CAREGIVER EDUCATION/ COPING:  Provided education regarding virtual check-in visit.  Patient and her daughter express their feelings openly. 4. PERSONAL EMERGENCY PLAN:  Patient's caregiver will contact EMS.  Patient will take naps when she is fatigued. 5. COMMUNITY RESOURCES COORDINATION/ HEALTH CARE NAVIGATION:  Patient's two daughters take turns staying with patient. 6. FINANCIAL/LEGAL CONCERNS/INTERVENTIONS:  None.     SOCIAL HX:  Social History   Tobacco Use  . Smoking status: Never Smoker  . Smokeless tobacco: Never Used  Substance Use Topics  . Alcohol use: No    CODE STATUS:  DNR ADVANCED DIRECTIVES: N MOST FORM COMPLETE:  N HOSPICE EDUCATION PROVIDED:  N PPS:  Patient's appetite is normal.  She uses her walker to ambulate. Duration of visit and documentation:  30 minutes.      Vella Kohler Amaka Gluth, LCSW

## 2018-10-11 NOTE — Progress Notes (Signed)
COMMUNITY PALLIATIVE CARE RN NOTE  PATIENT NAME: [name] DOB: March 19, 1924 MRN: [id number]  PRIMARY CARE PROVIDER: Lupita Raider, MD  RESPONSIBLE PARTY:  Acct ID - Guarantor Home Phone Work Phone Relationship Acct Type  [id number] Harrington Challenger* [phone] Self P/F     [address], [place], [place] [address]    Due to the COVID-19 crisis, this virtual check-in visit was done via telephone from my office and it was initiated and consent by this patient and or family.  PLAN OF CARE and INTERVENTION:  1. ADVANCE CARE PLANNING/GOALS OF CARE: Goal is for patient to remain in her home and avoid going to the hospital. She is a DNR. 2. PATIENT/CAREGIVER EDUCATION: Reinforced Safe Mobility 3. DISEASE STATUS: Joint virtual check-in visit made with Palliative Care SW, Lynn Duffy. Spoke with patient's daughter, [name], who is present with patient during the day. Patient denies any pain or discomfort. She remains able to engage in conversation and make needs known. Patient is forgetful and makes repetitive statements. She remains able to perform ADLs independently. She ambulates using her walker. No recent falls. Her daughter states that her CBGs have been good. Intake is good. Patient continues to nap on and off throughout the day and spends most of her time sitting in her recliner. No new issues or concerns. Will continue to monitor.   HISTORY OF PRESENT ILLNESS:  This is a 85 yo female who resides in her home. Her daughters, [name] and [name], take turns staying with patient so she is not alone. Palliative Care Team continues to follow patient. Will continue to check-in with patient monthly and PRN.   CODE STATUS: DNR ADVANCED DIRECTIVES: N MOST FORM: no PPS: 50%    (Duration of visit and documentation 30 minutes)    Candiss Norse, RN BSN

## 2018-10-12 DIAGNOSIS — Z9181 History of falling: Secondary | ICD-10-CM | POA: Diagnosis not present

## 2018-10-12 DIAGNOSIS — I739 Peripheral vascular disease, unspecified: Secondary | ICD-10-CM | POA: Diagnosis not present

## 2018-10-12 DIAGNOSIS — J449 Chronic obstructive pulmonary disease, unspecified: Secondary | ICD-10-CM | POA: Diagnosis not present

## 2018-10-12 DIAGNOSIS — R269 Unspecified abnormalities of gait and mobility: Secondary | ICD-10-CM | POA: Diagnosis not present

## 2018-10-12 DIAGNOSIS — E782 Mixed hyperlipidemia: Secondary | ICD-10-CM | POA: Diagnosis not present

## 2018-10-12 DIAGNOSIS — E1151 Type 2 diabetes mellitus with diabetic peripheral angiopathy without gangrene: Secondary | ICD-10-CM | POA: Diagnosis not present

## 2018-10-12 DIAGNOSIS — L97929 Non-pressure chronic ulcer of unspecified part of left lower leg with unspecified severity: Secondary | ICD-10-CM | POA: Diagnosis not present

## 2018-10-12 DIAGNOSIS — Z7984 Long term (current) use of oral hypoglycemic drugs: Secondary | ICD-10-CM | POA: Diagnosis not present

## 2018-10-12 DIAGNOSIS — F0390 Unspecified dementia without behavioral disturbance: Secondary | ICD-10-CM | POA: Diagnosis not present

## 2018-10-12 DIAGNOSIS — I700 Atherosclerosis of aorta: Secondary | ICD-10-CM | POA: Diagnosis not present

## 2018-10-12 DIAGNOSIS — I10 Essential (primary) hypertension: Secondary | ICD-10-CM | POA: Diagnosis not present

## 2018-10-29 ENCOUNTER — Other Ambulatory Visit: Payer: Medicare Other | Admitting: Licensed Clinical Social Worker

## 2018-10-29 ENCOUNTER — Other Ambulatory Visit: Payer: Self-pay

## 2018-10-29 DIAGNOSIS — Z515 Encounter for palliative care: Secondary | ICD-10-CM

## 2018-10-29 NOTE — Progress Notes (Signed)
COMMUNITY PALLIATIVE CARE SW NOTE  PATIENT NAME: [name] DOB: 06/06/1924 MRN: [id number]  PRIMARY CARE PROVIDER: Lupita Raider, MD  RESPONSIBLE PARTY:  Acct ID - Guarantor Home Phone Work Phone Relationship Acct Type  [id number] Harrington Challenger* [phone] Self P/F     [address], [place], [place] [address]   Due to the COVID-19 crisis, this virtual check-in visit was done via telephone from my office and it was initiated and consent given by this patient and or family.  PLAN OF CARE and INTERVENTIONS:             1. GOALS OF CARE/ ADVANCE CARE PLANNING:  Patient wishes to remain in her home.  She has a DNR. 2. SOCIAL/EMOTIONAL/SPIRITUAL ASSESSMENT/ INTERVENTIONS:  SW conducted a virtual check-in visit with patient in her home.  Her daughter, [name], was at home with her.  Patient denied pain and stated she was sleeping well.  She has decreased energy and misses being active.  She spends her time reading.  SW provided active listening and supportive counseling while patient expressed her concerns about the pandemic and her experiences with health care when she lived in other countries. 3. PATIENT/CAREGIVER EDUCATION/ COPING:  Provided education regarding virtual check-in-visit.  Patient copes by expressing her feelings openly. 4. PERSONAL EMERGENCY PLAN:  Her daughter's will contact EMS. 5. COMMUNITY RESOURCES COORDINATION/ HEALTH CARE NAVIGATION:  None. 6. FINANCIAL/LEGAL CONCERNS/INTERVENTIONS:  None.     SOCIAL HX:  Social History   Tobacco Use  . Smoking status: Never Smoker  . Smokeless tobacco: Never Used  Substance Use Topics  . Alcohol use: No    CODE STATUS:  DNR  ADVANCED DIRECTIVES: N MOST FORM COMPLETE:  N HOSPICE EDUCATION PROVIDED:  N PPS:  Patient reports her appetite is better than normal.  She ambulates with a walker. Duration of visit and documentation:  30 minutes.      Mikayla Kohler [name], LCSW

## 2018-11-24 IMAGING — CT CT HEAD W/O CM
3 of 4 series · 15 of 47 positions shown, 18 images · non-contrast
Comparison: MRI from 07/28/2006

CLINICAL DATA: Syncopal episodes.

EXAM:
CT HEAD WITHOUT CONTRAST
TECHNIQUE: Contiguous axial images were obtained from the base of the skull
through the vertex without intravenous contrast.

[Series 2: head w/o · axial · non-contrast · 0.45mm/px · z∈[-127,-7]mm · 9 of 30 slices shown, 12 images]
[im 3/30  brain]
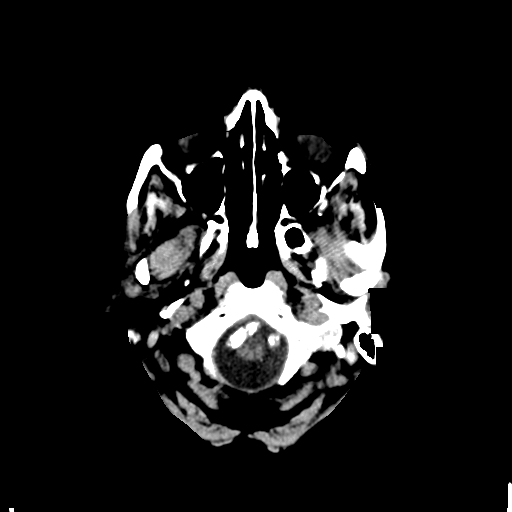
[im 3/30  bone]
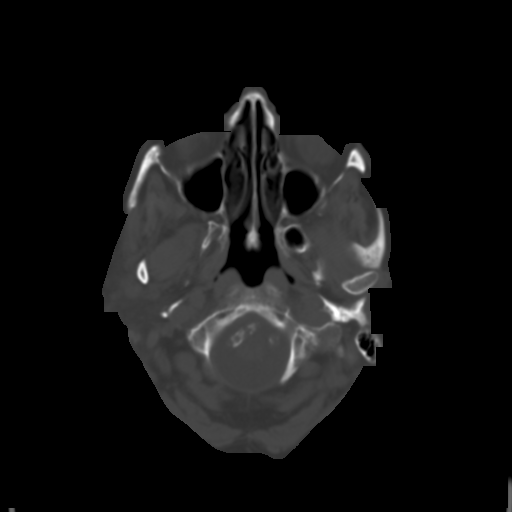
[im 6/30  brain]
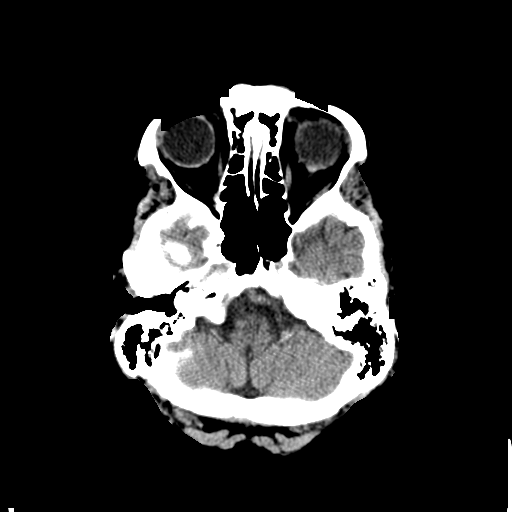
[im 9/30  brain]
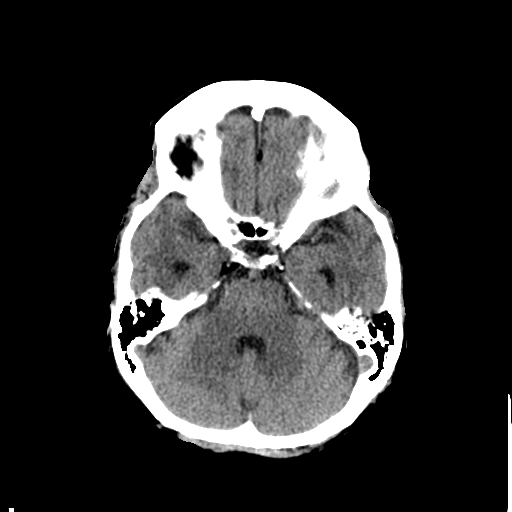
[im 12/30  brain]
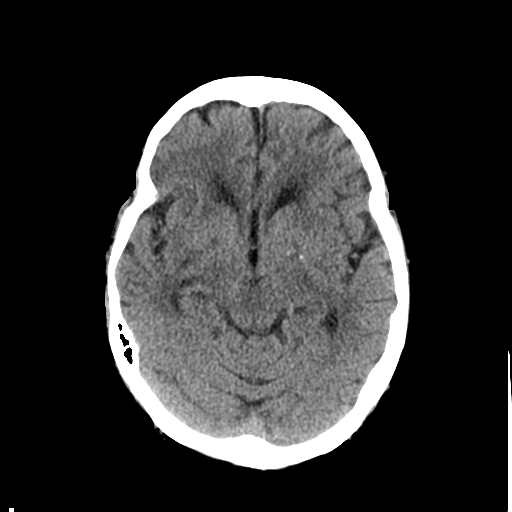
[im 15/30  brain]
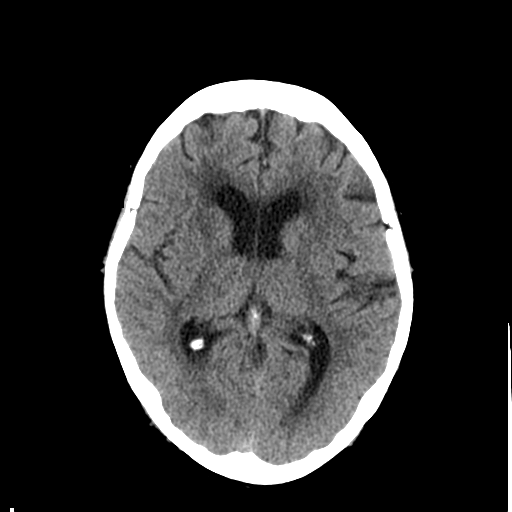
[im 15/30  bone]
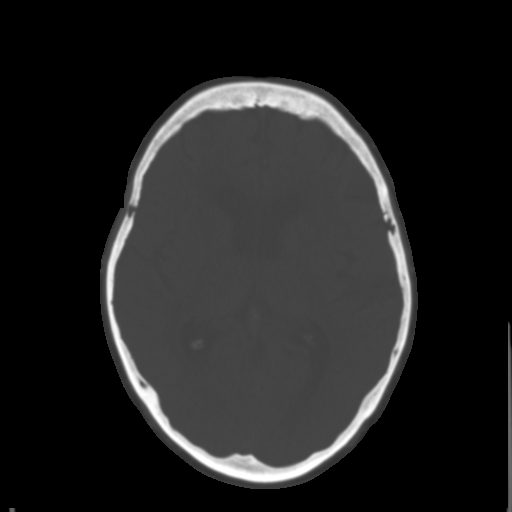
[im 18/30  brain]
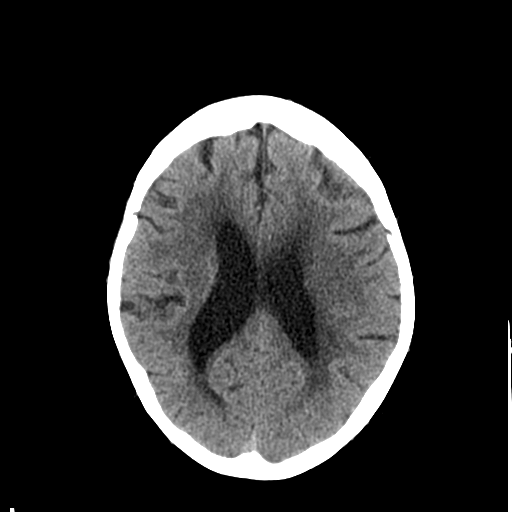
[im 21/30  brain]
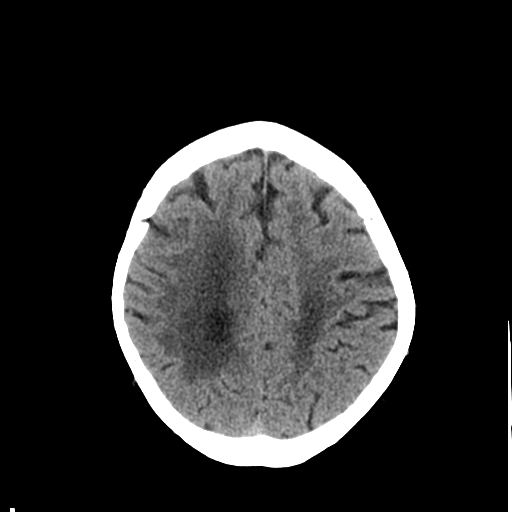
[im 24/30  brain]
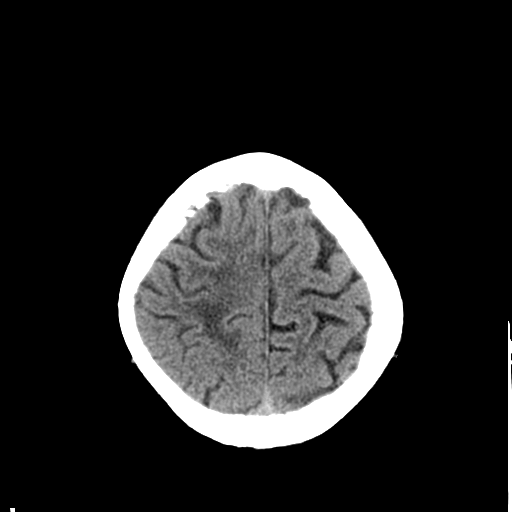
[im 27/30  brain]
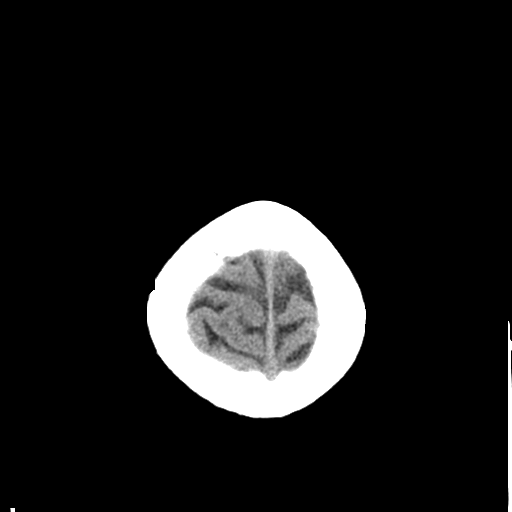
[im 27/30  bone]
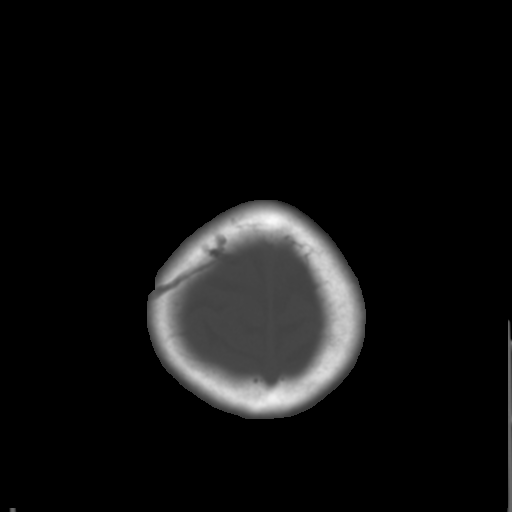

[Series 4: coronal · coronal · 0.28mm/px · 3 of 60 slices shown]
[im 20/60  brain]
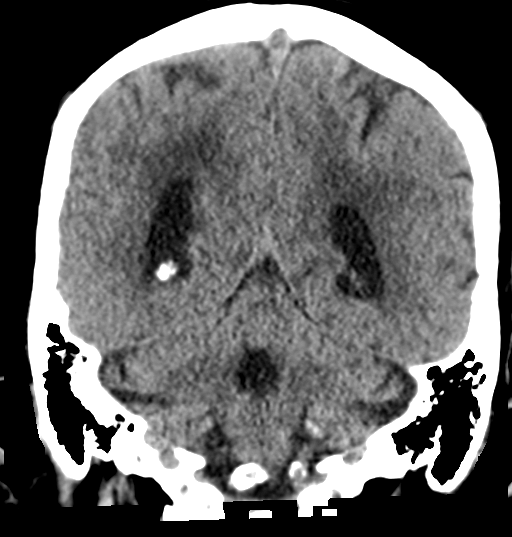
[im 27/60  brain]
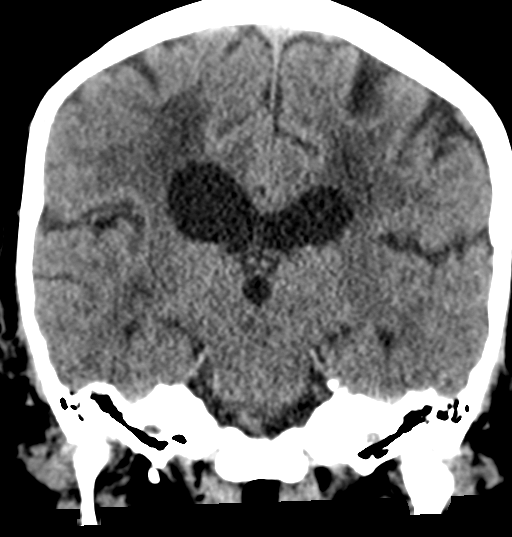
[im 33/60  brain]
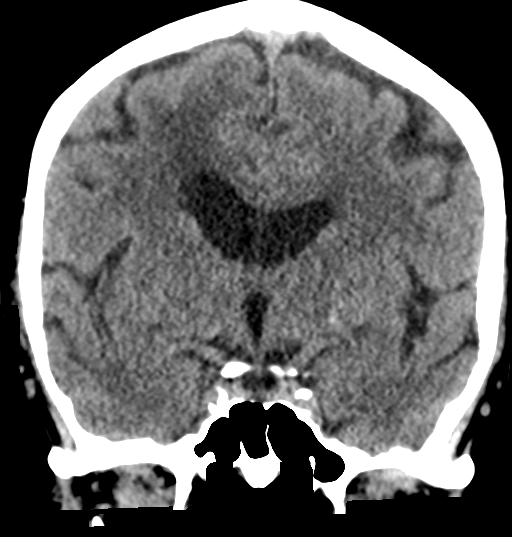

[Series 5: sagittal · sagittal · 0.30mm/px · 3 of 46 slices shown]
[im 16/46  brain]
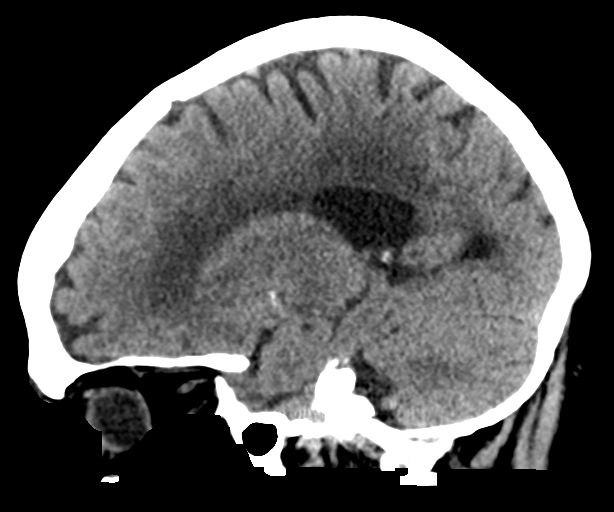
[im 23/46  brain]
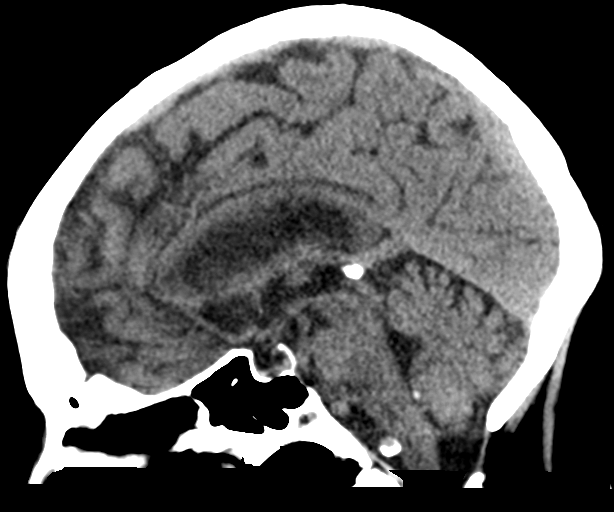
[im 31/46  brain]
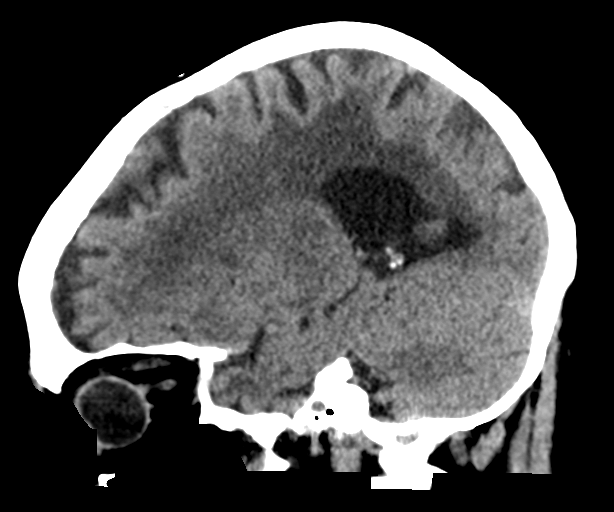

[15 of 47 positions shown; findings below may reference images not displayed]

FINDINGS: Brain: Moderate central atrophy with mild to moderate chronic
appearing small vessel ischemic disease of periventricular and
subcortical white matter. No definite intra-axial mass. No acute
intracranial hemorrhage, midline shift or edema. No extra-axial
fluid collections.

Vascular: Atherosclerosis of the carotid siphons and both vertebral
arteries.

Skull: Negative for acute fracture or focal lesions.

Sinuses/Orbits: Clear bilateral mastoids and paranasal sinuses.
Intact globes.

Other: None.
IMPRESSION: Atrophy with mild to moderate chronic appearing small vessel
ischemic disease of periventricular subcortical white matter. No
acute intracranial abnormality.

## 2018-12-21 ENCOUNTER — Other Ambulatory Visit: Payer: Medicare Other | Admitting: Licensed Clinical Social Worker

## 2018-12-21 ENCOUNTER — Other Ambulatory Visit: Payer: Self-pay

## 2018-12-21 DIAGNOSIS — Z515 Encounter for palliative care: Secondary | ICD-10-CM

## 2018-12-21 NOTE — Progress Notes (Signed)
COMMUNITY PALLIATIVE CARE SW NOTE  PATIENT NAME: [name] DOB: 1924-02-04 MRN: [id number]  PRIMARY CARE PROVIDER: Mayra Neer, MD  RESPONSIBLE PARTY:  Acct ID - Guarantor Home Phone Work Phone Relationship Acct Type  [id number] [name] Self P/F     [place], [place], [place] [address]   Due to the COVID-19 crisis, this virtual check-in visit was done via telephone from my office and it was initiated and consent given by this patient and or family.  PLAN OF CARE and INTERVENTIONS:             1. GOALS OF CARE/ ADVANCE CARE PLANNING:  Goal is for patient to remain in her home.  Patient has a DNR. 2. SOCIAL/EMOTIONAL/SPIRITUAL ASSESSMENT/ INTERVENTIONS:  SW conducted a virtual check-in visit with patient's daughter, [name], in patient's home.  Daughter stated patient has had no recent falls.  [name] stays with patient during the day and her other daughter, [name], remains with patient at night.  Patient is not experiencing any pain issues.  She is sleeping well at night. 3. PATIENT/CAREGIVER EDUCATION/ COPING:  Patient and her daughter cope by expressing themselves openly.  Provided education to [name] regarding Palliative Care. 4. PERSONAL EMERGENCY PLAN:  Daughter's will contact patient's MD. 5. COMMUNITY RESOURCES COORDINATION/ HEALTH CARE NAVIGATION:  None. 6. FINANCIAL/LEGAL CONCERNS/INTERVENTIONS:  None.     SOCIAL HX:  Social History   Tobacco Use  . Smoking status: Never Smoker  . Smokeless tobacco: Never Used  Substance Use Topics  . Alcohol use: No    CODE STATUS:  DNR  ADVANCED DIRECTIVES: N MOST FORM COMPLETE:  N HOSPICE EDUCATION PROVIDED: N PPS:  Daughter reports patient's appetite is normal.  She is able to ambulate with a walker. Duration of visit and documentation:  30 minutes.      Mikayla Corn Charday Capetillo, LCSW

## 2018-12-27 ENCOUNTER — Other Ambulatory Visit: Payer: Self-pay

## 2018-12-27 ENCOUNTER — Other Ambulatory Visit: Payer: Medicare Other | Admitting: Licensed Clinical Social Worker

## 2018-12-27 DIAGNOSIS — Z515 Encounter for palliative care: Secondary | ICD-10-CM

## 2018-12-27 NOTE — Progress Notes (Signed)
COMMUNITY PALLIATIVE CARE SW NOTE  PATIENT NAME: [name] DOB: [birth year] MRN: [id number]  PRIMARY CARE PROVIDER: Mayra Neer, MD  RESPONSIBLE PARTY:  Acct ID - Guarantor Home Phone Work Phone Relationship Acct Type  [id number] [name] Self P/F     [place], [place], [place] [address]   Due to the COVID-19 crisis, this virtual check-in visit was done via telephone from my office and it was initiated and consent given by this patient and or family.  PLAN OF CARE and INTERVENTIONS:             1. GOALS OF CARE/ ADVANCE CARE PLANNING:  Patient's goal is to remain in her home.  She has a DNR. 2. SOCIAL/EMOTIONAL/SPIRITUAL ASSESSMENT/ INTERVENTIONS:  SW conducted a virtual check-in visit with patient in her home.  She answered the phone and had difficulty hearing SW, so she gave the phone to her daughter, [name].  SW provided education regarding Palliative Care due to her not recalling previous visits or calls.  She stated her mother's symptoms were managed with no pain or falls.  Patient is sleeping and sleeping well. 3. PATIENT/CAREGIVER EDUCATION/ COPING:  Patient and her daughter express their feelings openly. 4. PERSONAL EMERGENCY PLAN:  Daughter will contact patient's MD. 5. COMMUNITY RESOURCES COORDINATION/ HEALTH CARE NAVIGATION:  None. 6. FINANCIAL/LEGAL CONCERNS/INTERVENTIONS:  None.     SOCIAL HX:  Social History   Tobacco Use  . Smoking status: Never Smoker  . Smokeless tobacco: Never Used  Substance Use Topics  . Alcohol use: No    CODE STATUS:  DNR  ADVANCED DIRECTIVES: N MOST FORM COMPLETE: N HOSPICE EDUCATION PROVIDED: N PPS: Daughter reports patient's appetite is normal.  Patient ambulates with walker. Duration of visit and documentation:  30 minutes.      Creola Corn Deagen Krass, LCSW

## 2019-01-03 DIAGNOSIS — J449 Chronic obstructive pulmonary disease, unspecified: Secondary | ICD-10-CM | POA: Diagnosis not present

## 2019-01-03 DIAGNOSIS — Z7189 Other specified counseling: Secondary | ICD-10-CM | POA: Diagnosis not present

## 2019-01-03 DIAGNOSIS — M1990 Unspecified osteoarthritis, unspecified site: Secondary | ICD-10-CM | POA: Diagnosis not present

## 2019-01-03 DIAGNOSIS — I10 Essential (primary) hypertension: Secondary | ICD-10-CM | POA: Diagnosis not present

## 2019-01-03 DIAGNOSIS — Z7984 Long term (current) use of oral hypoglycemic drugs: Secondary | ICD-10-CM | POA: Diagnosis not present

## 2019-01-03 DIAGNOSIS — H8110 Benign paroxysmal vertigo, unspecified ear: Secondary | ICD-10-CM | POA: Diagnosis not present

## 2019-01-03 DIAGNOSIS — E1151 Type 2 diabetes mellitus with diabetic peripheral angiopathy without gangrene: Secondary | ICD-10-CM | POA: Diagnosis not present

## 2019-01-03 DIAGNOSIS — R270 Ataxia, unspecified: Secondary | ICD-10-CM | POA: Diagnosis not present

## 2019-02-08 ENCOUNTER — Other Ambulatory Visit: Payer: Medicare Other | Admitting: *Deleted

## 2019-02-08 ENCOUNTER — Other Ambulatory Visit: Payer: Self-pay

## 2019-02-08 DIAGNOSIS — Z515 Encounter for palliative care: Secondary | ICD-10-CM

## 2019-02-08 NOTE — Progress Notes (Signed)
COMMUNITY PALLIATIVE CARE RN NOTE  PATIENT NAME: LAGRETTA LOSEKE DOB: 1924/03/16 MRN: [id number]  PRIMARY CARE PROVIDER: Mayra Neer, MD  RESPONSIBLE PARTY:  Acct ID - Guarantor Home Phone Work Phone Relationship Acct Type  [id number] [name] Self P/F     [place], [place], [place] [address]   Due to the COVID-19 crisis, this virtual check-in visit was done via telephone from my office and it was initiated and consent by this patient and or family.  PLAN OF CARE and INTERVENTION:  1. ADVANCE CARE PLANNING/GOALS OF CARE: Goal is for patient to remain in her home. She is a DNR. 2. PATIENT/CAREGIVER EDUCATION: N/A 3. DISEASE STATUS: Virtual check-in visit completed via telephone. Patient denies pain at this time. She is pleasant and engaging. Forgetful and repetitive at times. She says that she remains ambulatory using her walker. No recent falls. She remains able to perform ADLs independently, but one of her daughter's are present at all times. She spends most of her day sitting up in her recliner. She does take naps throughout the day. She reports that she has no difficulty sleeping at night. Her intake is normal. She is eating 3 meals/day and snacks in between. She denies dysphagia. Her blood sugars have been stable. She is continent of both bowel and bladder. She does not report any issues or concerns at this time. Will continue to monitor.   HISTORY OF PRESENT ILLNESS:  This is a 85 yo female who resides in her home. Palliative care team continues to follow patient. Will continue to visit monthly and PRN.  CODE STATUS: DNR ADVANCED DIRECTIVES: N MOST FORM: no PPS: 50%   (Duration of visit and documentation 45 minutes)   Daryl Eastern, RN BSN

## 2019-02-22 DIAGNOSIS — Z23 Encounter for immunization: Secondary | ICD-10-CM | POA: Diagnosis not present

## 2019-02-28 ENCOUNTER — Other Ambulatory Visit: Payer: Medicare Other | Admitting: Licensed Clinical Social Worker

## 2019-02-28 ENCOUNTER — Other Ambulatory Visit: Payer: Self-pay

## 2019-02-28 DIAGNOSIS — Z515 Encounter for palliative care: Secondary | ICD-10-CM

## 2019-02-28 NOTE — Progress Notes (Signed)
COMMUNITY PALLIATIVE CARE SW NOTE  PATIENT NAME: [name] DOB: 1924-02-12 MRN: [id number]  PRIMARY CARE PROVIDER: Mayra Neer, MD  RESPONSIBLE PARTY:  Acct ID - Guarantor Home Phone Work Phone Relationship Acct Type  [id number] [name] Self P/F     [place], [place], [place] [address]   Due to the COVID-19 crisis, this virtual check-in visit was done via telephone from my office and it was initiated and consent given by this patient and or family.  PLAN OF CARE and INTERVENTIONS:             1. GOALS OF CARE/ ADVANCE CARE PLANNING:  Goal is for patient to remain in her home.  Patient has a DNR. 2. SOCIAL/EMOTIONAL/SPIRITUAL ASSESSMENT/ INTERVENTIONS:  SW conducted a virtual check-in visit with patient in her home.  She denied pain.  She stated she stumbles sometimes when she walks, but has not had any falls.  Her two daughters continue taking turns staying with patient.  Patient stated she was "good" and looks forward to a face to face visit in the future. 3. PATIENT/CAREGIVER EDUCATION/ COPING:  Patient expresses her feelings openly.  She has a dog that helps her cope also. 4. PERSONAL EMERGENCY PLAN:  Patient's daughter will contact the MD or EMS. 5. COMMUNITY RESOURCES COORDINATION/ HEALTH CARE NAVIGATION:  None. 6. FINANCIAL/LEGAL CONCERNS/INTERVENTIONS:  None.     SOCIAL HX:  Social History   Tobacco Use  . Smoking status: Never Smoker  . Smokeless tobacco: Never Used  Substance Use Topics  . Alcohol use: No    CODE STATUS:  DNR  ADVANCED DIRECTIVES: N MOST FORM COMPLETE:  N HOSPICE EDUCATION PROVIDED: N PPS:  Patient reports her appetite is normal.  She uses a walker to ambulate. Duration of visit and documentation:  30 minutes.      Creola Corn Torrey Horseman, LCSW

## 2019-03-11 DIAGNOSIS — N393 Stress incontinence (female) (male): Secondary | ICD-10-CM | POA: Diagnosis not present

## 2019-03-14 ENCOUNTER — Telehealth: Payer: Self-pay | Admitting: *Deleted

## 2019-03-14 NOTE — Telephone Encounter (Signed)
Received a message from Libby Maw [facility] Admission RN), stating that patient's daughter, [name], called to the hospice referral department and wanted her mom evaluated for hospice. Butch Penny performed this evaluation this am at 10:00. Patient does not currently meet hospice eligibility criteria and will remain on Palliative Care services at this time. Palliative visit scheduled for 03/22/19 at 1:30 pm.

## 2019-03-22 ENCOUNTER — Other Ambulatory Visit: Payer: Medicare Other | Admitting: *Deleted

## 2019-03-22 ENCOUNTER — Other Ambulatory Visit: Payer: Medicare Other | Admitting: Licensed Clinical Social Worker

## 2019-03-22 ENCOUNTER — Other Ambulatory Visit: Payer: Self-pay

## 2019-03-22 DIAGNOSIS — Z515 Encounter for palliative care: Secondary | ICD-10-CM

## 2019-03-26 NOTE — Progress Notes (Signed)
COMMUNITY PALLIATIVE CARE SW NOTE  PATIENT NAME: [name] DOB: 07-28-1923 MRN: [id number]  PRIMARY CARE PROVIDER: Mayra Neer, MD  RESPONSIBLE PARTY:  Acct ID - Guarantor Home Phone Work Phone Relationship Acct Type  [id number] [name] Self P/F     [place], [place], [place] [address]     PLAN OF CARE and INTERVENTIONS:             1. GOALS OF CARE/ ADVANCE CARE PLANNING:  Patient's goal is to remain in her home.  She has a DNR. 2. SOCIAL/EMOTIONAL/SPIRITUAL ASSESSMENT/ INTERVENTIONS:  SW and Palliative Care RN, Daryl Eastern, met with patient and her daughter, [name], in patient's home.  She was sitting in her recliner and was alert and oriented.  [name] reported that her sister, [name], has been displaying increased confusion and is not caring for patient as much during the day.  [name] remains with patient at night, does meal prep and oversees her medications.  Patient reports being unsteady at times.  SW provided active listening and supportive counseling. 3. PATIENT/CAREGIVER EDUCATION/ COPING:  Provided education regarding the Palliative Care program to [facility], and she stated she understood. 4. PERSONAL EMERGENCY PLAN:  Patient's family will contact the MD or EMS. 5. COMMUNITY RESOURCES COORDINATION/ HEALTH CARE NAVIGATION:  None. 6. FINANCIAL/LEGAL CONCERNS/INTERVENTIONS:  None.     SOCIAL HX:  Social History   Tobacco Use  . Smoking status: Never Smoker  . Smokeless tobacco: Never Used  Substance Use Topics  . Alcohol use: No    CODE STATUS:  DNR  ADVANCED DIRECTIVES: No MOST FORM COMPLETE:  No HOSPICE EDUCATION PROVIDED:  No PPS: Patient reports her appetite is good.  She ambulates with a walker in the house. Duration of visit and documentation:  45 minutes.     Creola Corn Lynze Reddy, LCSW

## 2019-03-28 NOTE — Progress Notes (Signed)
COMMUNITY PALLIATIVE CARE RN NOTE  PATIENT NAME: [name] DOB: [birth year] MRN: [id number]  PRIMARY CARE PROVIDER: Mayra Neer, MD  RESPONSIBLE PARTY:  Acct ID - Guarantor Home Phone Work Phone Relationship Acct Type  [id number] [name] Self P/F     [place], [place], [place] [address]   Covid-19 Pre-screening Negative  PLAN OF CARE and INTERVENTION:  1. ADVANCE CARE PLANNING/GOALS OF CARE: Goal is for patient to remain in her home. She has a DNR. 2. PATIENT/CAREGIVER EDUCATION: Reviewed Palliative care services for daughter, [facility] 3. DISEASE STATUS: Joint visit made with Palliative Care SW, Lynn Duffy. Patient was assessed for hospice eligibility last week, but did not qualify d/t prognosis greater than 6 months. Daughter, [name], present at visit. Patient does experience left knee pain at times and says that it can be difficulty trying to stand and balance herself. She denies pain at this time. No dyspnea or coughing noted. She is alert and oriented x 2, but intermittent confused forgetful and repetitive at times. She is independent with bathing, dressing, ambulation and feeding. She ambulates using a walker inside the home. [name] is concerned about her falling when she goes into the back yard. Patient loved to garden. She has a transporter wheelchair (used outside of the home), hospital bed and shower chair. No recent falls. [name] says patient had a back adjustment with a Chiropractor about a month ago and feels this has improved patient's balance. Her appetite has been good. Denies dysphagia. She received Meals on Wheels. Blood sugars are stable. She is starting with some intermittent urinary incontinence. She was checked for a UTI about 3 weeks ago and results were negative. She is continent of bowels. Her daughter, [name], comes in the morning and prepares breakfast and gives patient her medications each morning. [name] works during the day  and stays with patient during the night. Will continue to monitor.  HISTORY OF PRESENT ILLNESS:  This is a 85 yo female who resides in her home. Palliative care team to continue to follow patient. Will visit monthly and PRN.  CODE STATUS: DNR ADVANCED DIRECTIVES: N MOST FORM: no PPS: 50%   PHYSICAL EXAM:   VITALS: Today's Vitals   03/22/19 1156  BP: 123/77  Pulse: 73  Resp: 18  Temp: (!) 97.4 F (36.3 C)  TempSrc: Temporal  SpO2: 94%  PainSc: 0-No pain    LUNGS: clear to auscultation  CARDIAC: Cor RRR EXTREMITIES: No edema SKIN: Exposed skin is dry and intact  NEURO: Alert and oriented x 2 (person/place), intermittent confusion/forgetfulness, ambulatory with walker   (Duration of visit and documentation 60 minutes)   Daryl Eastern, RN BSN

## 2019-04-19 ENCOUNTER — Telehealth: Payer: Self-pay | Admitting: *Deleted

## 2019-04-19 NOTE — Telephone Encounter (Signed)
Contacted and spoke with patient's daughter, [name], to arrange a home visit. Visit scheduled for Wednesday 04/24/19 at 4:00 pm.

## 2019-04-24 ENCOUNTER — Other Ambulatory Visit: Payer: Medicare Other | Admitting: *Deleted

## 2019-04-24 ENCOUNTER — Other Ambulatory Visit: Payer: Self-pay

## 2019-04-24 DIAGNOSIS — Z515 Encounter for palliative care: Secondary | ICD-10-CM

## 2019-04-29 NOTE — Progress Notes (Signed)
COMMUNITY PALLIATIVE CARE RN NOTE  PATIENT NAME: [name] DOB: Aug 20, 1923 MRN: [id number]  PRIMARY CARE PROVIDER: Mayra Neer, MD  RESPONSIBLE PARTY: [name] (daughter) Acct ID - Guarantor Home Phone Work Phone Relationship Acct Type  [id number] CHAYAH, [phone] Self P/F     [place], [place], [place] [address]   Covid-19 Pre-screening Negative  PLAN OF CARE and INTERVENTION:  1. ADVANCE CARE PLANNING/GOALS OF CARE: Goal is to remain in her home and avoid hospitalizations. 2. PATIENT/CAREGIVER EDUCATION: [facility] Mob[facility] 3. DISEASE STATUS: Met with patient and her daughter, [name], in patient's home. She is sitting up in her recliner eating dinner. She is pleasant and engaging. She is forgetful and repetitive. She denies pain. No dyspnea noted. Occasional nonproductive cough and clearing her throat during visit. She spends most of her day sitting in her recliner. She takes small naps during the day and reports sleeping well at night. She remains ambulatory using her walker. She is also able to give herself a sponge bath, dress, toilet and feed herself.  She wears Depends for occasional urinary incontinence. No recent falls. She has a good appetite w/good fluid intake. Denies dysphagia and none noted during meal this evening. She receives meals on wheels. She is a diabetic and takes insulin daily. Her blood sugars have been stable per daughter. Her daughter [name] continues to stay with patient during the night. Her daughter, [name], comes in the mornings to prepare her breakfast and make sure she takes her medications while [name] is at work. Patient has a routine appointment with her PCP on December 4th and is also to receive lab work. Will continue to monitor.   HISTORY OF PRESENT ILLNESS:  This is a 85 yo female who resides at home. She has a supportive family. Palliative care team continues to follow patient. Will continue to visit patient monthly and  PRN.  CODE STATUS: DNR  ADVANCED DIRECTIVES: N MOST FORM: no PPS: 50%   PHYSICAL EXAM:   VITALS: Today's Vitals   04/24/19 1626  BP: 119/61  Pulse: 69  Resp: 18  Temp: (!) 97.2 F (36.2 C)  TempSrc: Temporal  SpO2: 98%  PainSc: 0-No pain    LUNGS: clear to auscultation  CARDIAC: Cor RRR EXTREMITIES: No edema SKIN: Skin color, texture, turgor normal. No rashes or lesions  NEURO: Alert and oriented x 2 (person/place), forgetful, generalized weakness, ambulatory w/walker   (Duration of visit and documentation 60 minutes)   Daryl Eastern, RN BSN

## 2019-05-07 ENCOUNTER — Other Ambulatory Visit: Payer: Medicare Other | Admitting: Licensed Clinical Social Worker

## 2019-05-07 DIAGNOSIS — Z515 Encounter for palliative care: Secondary | ICD-10-CM

## 2019-05-07 NOTE — Progress Notes (Signed)
COMMUNITY PALLIATIVE CARE SW NOTE  PATIENT NAME: [name] DOB: 03-20-1924 MRN: [id number]  PRIMARY CARE PROVIDER: Mayra Neer, MD  RESPONSIBLE PARTY:  Acct ID - Guarantor Home Phone Work Phone Relationship Acct Type  [id number] [name] Self P/F     [place], [place], [place] [address]   Due to the COVID-19 crisis, this virtual check-in visit was done via telephone from my office and it was initiated and consent given by this patient and or family.  PLAN OF CARE and INTERVENTIONS:             1. GOALS OF CARE/ ADVANCE CARE PLANNING:  The goal for patient is to remain in her home.  Patient has a DNR. 2. SOCIAL/EMOTIONAL/SPIRITUAL ASSESSMENT/ INTERVENTIONS:  SW conducted a Sales executive visit with patient's daughter, [name].  She was in the patient's home with her.  [name] stated patient was doing the same since last visit.  She has a MD appointment in December.  Scheduled a visit in two weeks.  [name] had no other concerns or questions. 3. PATIENT/CAREGIVER EDUCATION/ COPING:  [name] copes by problem-solving. 4. PERSONAL EMERGENCY PLAN:  Family will contact patient's MD or EMS. 5. COMMUNITY RESOURCES COORDINATION/ HEALTH CARE NAVIGATION:  No. 6. FINANCIAL/LEGAL CONCERNS/INTERVENTIONS:  No.     SOCIAL HX:  Social History   Tobacco Use  . Smoking status: Never Smoker  . Smokeless tobacco: Never Used  Substance Use Topics  . Alcohol use: No    CODE STATUS:  DNR ADVANCED DIRECTIVES: N MOST FORM COMPLETE: N  HOSPICE EDUCATION PROVIDED: N PPS:  Patient's appetite is normal.  She uses a walker to ambulate around her house. Duration of visit and documentation:  30 minutes.      Creola Corn Chauncy Mangiaracina, LCSW

## 2019-05-08 ENCOUNTER — Other Ambulatory Visit: Payer: Self-pay

## 2019-05-14 ENCOUNTER — Other Ambulatory Visit: Payer: Self-pay

## 2019-05-14 ENCOUNTER — Other Ambulatory Visit: Payer: Medicare Other | Admitting: *Deleted

## 2019-05-14 ENCOUNTER — Other Ambulatory Visit: Payer: Medicare Other | Admitting: Licensed Clinical Social Worker

## 2019-05-14 DIAGNOSIS — Z515 Encounter for palliative care: Secondary | ICD-10-CM
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
## 2019-05-31 DIAGNOSIS — E1151 Type 2 diabetes mellitus with diabetic peripheral angiopathy without gangrene: Secondary | ICD-10-CM | POA: Diagnosis not present

## 2019-05-31 DIAGNOSIS — Z7984 Long term (current) use of oral hypoglycemic drugs: Secondary | ICD-10-CM | POA: Diagnosis not present

## 2019-06-04 ENCOUNTER — Telehealth: Payer: Self-pay | Admitting: *Deleted

## 2019-06-04 NOTE — Telephone Encounter (Signed)
Contacted and spoke with patient's daughter, [name], to arrange a home visit. Visit scheduled for Thursday, 06/13/19 at 4:00 pm.

## 2019-06-05 DIAGNOSIS — E11319 Type 2 diabetes mellitus with unspecified diabetic retinopathy without macular edema: Secondary | ICD-10-CM | POA: Diagnosis not present

## 2019-06-05 DIAGNOSIS — Z7189 Other specified counseling: Secondary | ICD-10-CM | POA: Diagnosis not present

## 2019-06-05 DIAGNOSIS — M810 Age-related osteoporosis without current pathological fracture: Secondary | ICD-10-CM | POA: Diagnosis not present

## 2019-06-05 DIAGNOSIS — I10 Essential (primary) hypertension: Secondary | ICD-10-CM | POA: Diagnosis not present

## 2019-06-05 DIAGNOSIS — Z0000 Encounter for general adult medical examination without abnormal findings: Secondary | ICD-10-CM | POA: Diagnosis not present

## 2019-06-05 DIAGNOSIS — J449 Chronic obstructive pulmonary disease, unspecified: Secondary | ICD-10-CM | POA: Diagnosis not present

## 2019-06-05 DIAGNOSIS — I739 Peripheral vascular disease, unspecified: Secondary | ICD-10-CM | POA: Diagnosis not present

## 2019-06-05 DIAGNOSIS — Z7984 Long term (current) use of oral hypoglycemic drugs: Secondary | ICD-10-CM | POA: Diagnosis not present

## 2019-06-05 DIAGNOSIS — I700 Atherosclerosis of aorta: Secondary | ICD-10-CM | POA: Diagnosis not present

## 2019-06-05 DIAGNOSIS — F0390 Unspecified dementia without behavioral disturbance: Secondary | ICD-10-CM | POA: Diagnosis not present

## 2019-06-05 DIAGNOSIS — E1151 Type 2 diabetes mellitus with diabetic peripheral angiopathy without gangrene: Secondary | ICD-10-CM | POA: Diagnosis not present

## 2019-06-05 DIAGNOSIS — E782 Mixed hyperlipidemia: Secondary | ICD-10-CM | POA: Diagnosis not present

## 2019-06-13 ENCOUNTER — Other Ambulatory Visit: Payer: Self-pay

## 2019-06-13 ENCOUNTER — Other Ambulatory Visit: Payer: Medicare Other | Admitting: *Deleted

## 2019-06-13 DIAGNOSIS — Z515 Encounter for palliative care: Secondary | ICD-10-CM

## 2019-06-13 NOTE — Progress Notes (Signed)
COMMUNITY PALLIATIVE CARE RN NOTE  PATIENT NAME: JUSTIN BUECHNER DOB: [birth year] MRN: [id number]  PRIMARY CARE PROVIDER: Mayra Neer, MD  RESPONSIBLE PARTY: [name] (daughter) Acct ID - Guarantor Home Phone Work Phone Relationship Acct Type  [id number] EVERLENE, [phone] Self P/F     [place], [place], [place] [address]   Covid-19 Pre-screening Negative  PLAN OF CARE and INTERVENTION:  1. ADVANCE CARE PLANNING/GOALS OF CARE: Goal is for patient to remain in her home and avoid hospitalizations. 2. PATIENT/CAREGIVER EDUCATION: Safe Mobility 3. DISEASE STATUS: Met with patient, and her daughter's [name] and [name], in patient's home. Patient is sitting up in her recliner awake and alert, feeding herself dinner. She is forgetful, but able to answer most questions appropriately. She denies pain. She remains ambulatory using her walker and she is able to bathe, dress, transfer and ambulate independently. No recent falls. She had a recent appointment to have labs drawn along with a telehealth visit the following week. Her Hgb A1C was elevated. [name] did not want to go up on patient's insulin as of yet. She continues on insulin once daily. Her intake is normal. She denies dysphagia and takes her medications without difficulty. She sleeps well during the night and takes naps during the day. Less issues with urinary incontinence. She wears Depends. BMs are regular. Her daughter, [name], is now living with patient 24/7 and makes sure patient has her meals and medications. [name] has had a recent decline in her cognitive status so [name] felt better to have her live with patient vs by herself. Patient does say that her vision and hearing are slowly worsening. Will continue to visit patient monthly and PRN.   HISTORY OF PRESENT ILLNESS: This is a 85 yo female who resides at home. Palliative care team continue to follow patient. Will continue monthly and PRN visits.   CODE  STATUS: DNR  ADVANCED DIRECTIVES: N MOST FORM: no PPS: 50%   PHYSICAL EXAM:   VITALS: Today's Vitals   06/13/19 1635  BP: 118/65  Pulse: (!) 59  Resp: 16  Temp: (!) 97 F (36.1 C)  TempSrc: Temporal  SpO2: 96%  PainSc: 0-No pain    LUNGS: clear to auscultation  CARDIAC: Cor Brady EXTREMITIES: No edema SKIN: Exposed skin is dry and intact  NEURO: Alert and oriented to person/place, forgetful, HOH, ambulatory w/walker   (Duration of visit and documentation 45 minutes)   Daryl Eastern, RN BSN

## 2019-07-01 DIAGNOSIS — R443 Hallucinations, unspecified: Secondary | ICD-10-CM | POA: Diagnosis not present

## 2019-07-08 ENCOUNTER — Other Ambulatory Visit: Payer: Self-pay

## 2019-07-08 ENCOUNTER — Other Ambulatory Visit: Payer: Medicare Other | Admitting: Licensed Clinical Social Worker

## 2019-07-08 DIAGNOSIS — Z515 Encounter for palliative care: Secondary | ICD-10-CM
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
## 2019-07-11 ENCOUNTER — Other Ambulatory Visit: Payer: Medicare Other | Admitting: *Deleted

## 2019-07-11 ENCOUNTER — Other Ambulatory Visit: Payer: Self-pay

## 2019-07-11 ENCOUNTER — Other Ambulatory Visit: Payer: Medicare Other | Admitting: Licensed Clinical Social Worker

## 2019-07-11 DIAGNOSIS — Z515 Encounter for palliative care: Secondary | ICD-10-CM

## 2019-07-12 ENCOUNTER — Other Ambulatory Visit: Payer: Self-pay

## 2019-07-12 NOTE — Progress Notes (Signed)
COMMUNITY PALLIATIVE CARE SW NOTE  PATIENT NAME: [name] DOB: Sep 10, 1923 MRN: [id number]  PRIMARY CARE PROVIDER: Mayra Neer, MD  RESPONSIBLE PARTY:  Acct ID - Guarantor Home Phone Work Phone Relationship Acct Type  [id number] Mikayla [phone] Self P/F     [place], [place], [place] [address]     PLAN OF CARE and INTERVENTIONS:             1. GOALS OF CARE/ ADVANCE CARE PLANNING:  Goal is for patient to remain in her home.  Patient has a DNR. 2. SOCIAL/EMOTIONAL/SPIRITUAL ASSESSMENT/ INTERVENTIONS:  SW and Palliative Care SW , [name], met with patient, and her two daughters, [name] and [name].  Patient was in her recliner.  She denied pain.  [name] reports patient fell last week with no injuries.  [name]'s sons had to lift her back into her chair.  Patient does not recall her falls.  [name] was minimally involved the conversation.  Patient displayed increased cognitive deficits, also noted by [name].  Patient acknowledged having previous hallucinations.  Medications have been added to relieve symptoms.  3. PATIENT/CAREGIVER EDUCATION/ COPING:  [name] copes by problem-loving. 4. PERSONAL EMERGENCY PLAN:  EMS will be contacted. 5. COMMUNITY RESOURCES COORDINATION/ HEALTH CARE NAVIGATION:  Meals on Wheels. 6. FINANCIAL/LEGAL CONCERNS/INTERVENTIONS:  None.     SOCIAL HX:  Social History   Tobacco Use  . Smoking status: Never Smoker  . Smokeless tobacco: Never Used  Substance Use Topics  . Alcohol use: No    CODE STATUS:  DNR  ADVANCED DIRECTIVES: N MOST FORM COMPLETE:  N HOSPICE EDUCATION PROVIDED:  N PPS:  Patient reports her appetite is normal.  She ambulates with a walker. Duration of visit and documentation:  60 minutes.      Mikayla Corn Symiah Nowotny, LCSW
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
## 2019-07-22 ENCOUNTER — Ambulatory Visit: Payer: Medicare Other | Attending: Internal Medicine

## 2019-07-22 DIAGNOSIS — Z23 Encounter for immunization: Secondary | ICD-10-CM | POA: Insufficient documentation

## 2019-07-22 NOTE — Progress Notes (Signed)
   Covid-19 Vaccination Clinic  Name:  [name]    MRN: [id number] DOB: Nov 08, 1923  07/22/2019  [name] was observed post Covid-19 immunization for 15 minutes without incidence. She was provided with Vaccine Information Sheet and instruction to access the V-Safe system.   [name] was instructed to call 911 with any severe reactions post vaccine: Marland Kitchen Difficulty breathing  . Swelling of your face and throat  . A fast heartbeat  . A bad rash all over your body  . Dizziness and weakness    Immunizations Administered    Name Date Dose VIS Date Route   Pfizer COVID-19 Vaccine 07/22/2019  8:33 AM 0.3 mL 06/07/2019 Intramuscular   Manufacturer: [employer], [employer]   Lot: [id number]   NDC: [address]-5

## 2019-08-06 ENCOUNTER — Telehealth: Payer: Self-pay | Admitting: *Deleted

## 2019-08-06 NOTE — Telephone Encounter (Signed)
Contacted and spoke with patient's daughter, [name], to arrange a home visit. Visit scheduled for 07/29/19 at 4:00 pm.

## 2019-08-07 ENCOUNTER — Other Ambulatory Visit: Payer: Self-pay

## 2019-08-07 ENCOUNTER — Other Ambulatory Visit: Payer: Medicare Other | Admitting: *Deleted

## 2019-08-07 DIAGNOSIS — Z515 Encounter for palliative care: Secondary | ICD-10-CM

## 2019-08-07 NOTE — Progress Notes (Signed)
COMMUNITY PALLIATIVE CARE RN NOTE  PATIENT NAME: [name] DOB: 1923-10-27 MRN: [id number]  PRIMARY CARE PROVIDER: Mayra Neer, MD  RESPONSIBLE PARTY: [name] (daughter) Acct ID - Guarantor Home Phone Work Phone Relationship Acct Type  [id number] [name] Self P/F     [place], [place], [place] [address]   Covid-19 Pre-screening Negative  PLAN OF CARE and INTERVENTION:  1. ADVANCE CARE PLANNING/GOALS OF CARE: Goal is for patient to remain in her home and avoid hospitalizations. She has a DNR.  2. PATIENT/CAREGIVER EDUCATION: Symptom management, Safe mobility/transfers 3. DISEASE STATUS: Met with patient and her daughter's, [name] and [name], in patient's home. Upon arrival, patient is sitting up in her recliner finishing a meal. She is alert and oriented to person/place and able to answer questions appropriately. She is forgetful. She denies pain at this time. She remains ambulatory using her walker. No recent falls. She is able to bathe, dress and feed herself independently. She continues on Valium at bedtime. Since starting this medication, [name] feels that patient's mood has improved, she is more calm, sleeps better and has not had any hallucinations. She has a good appetite. No dysphagia reported. She takes all of her medications without difficulty with fluids. She is mainly continent of urine, but may have an occasional accident. She had an accident with her bowels recently d/t an upset stomach and inability to make it to the bathroom in time, but is usually continent. [name] comes over daily to administer patient's insulin. Her other daughter, [name], is now living with patient and helps with meal preparation and making sure patient takes her medications. Patient is going for her 2nd dose of her Covid-19 Vaccination on Monday, 08/12/19. She did not have any symptoms or issues after her 1st dose. Will continue to monitor.   HISTORY OF PRESENT  ILLNESS:  This is a 86 yo female who resides in her home. She has a very supportive family. Palliative care team continues to follow patient. Will continue to visit monthly and PRN.  CODE STATUS: DNR ADVANCED DIRECTIVES: N MOST FORM: no PPS: 50%   PHYSICAL EXAM:   VITALS: Today's Vitals   08/07/19 1630  BP: 104/65  Pulse: 71  Resp: 20  Temp: (!) 97.3 F (36.3 C)  TempSrc: Temporal  SpO2: 97%  PainSc: 0-No pain    LUNGS: clear to auscultation  CARDIAC: Cor RRR EXTREMITIES: No edema SKIN: Exposed skin is dry and intact  NEURO: Alert and oriented x 2 (person/place), forgetful, generalized weakness, ambulatory w/walker   (Duration of visit and documentation 45 minutes)   Daryl Eastern, RN BSN

## 2019-08-12 ENCOUNTER — Ambulatory Visit: Payer: Medicare Other | Attending: Internal Medicine

## 2019-08-12 DIAGNOSIS — Z23 Encounter for immunization: Secondary | ICD-10-CM | POA: Insufficient documentation

## 2019-08-12 NOTE — Progress Notes (Signed)
   Covid-19 Vaccination Clinic  Name:  [name]    MRN: [id number] DOB: Feb 08, 1924  08/12/2019  [name] was observed post Covid-19 immunization for 15 minutes without incidence. She was provided with Vaccine Information Sheet and instruction to access the V-Safe system.   [name] was instructed to call 911 with any severe reactions post vaccine: Marland Kitchen Difficulty breathing  . Swelling of your face and throat  . A fast heartbeat  . A bad rash all over your body  . Dizziness and weakness    Immunizations Administered    Name Date Dose VIS Date Route   Pfizer COVID-19 Vaccine 08/12/2019  9:05 AM 0.3 mL 06/07/2019 Intramuscular   Manufacturer: [employer], [employer]   Lot: [id number]   NDC: [address]-1

## 2019-09-05 ENCOUNTER — Telehealth: Payer: Self-pay | Admitting: *Deleted

## 2019-09-05 NOTE — Telephone Encounter (Signed)
Called and spoke with patient's daughter, [name], to arrange a Palliative care visit. Visit scheduled for 09/18/19 at 4p.

## 2019-09-18 ENCOUNTER — Other Ambulatory Visit: Payer: Self-pay

## 2019-09-18 ENCOUNTER — Other Ambulatory Visit: Payer: Medicare Other | Admitting: *Deleted

## 2019-09-18 DIAGNOSIS — Z515 Encounter for palliative care: Secondary | ICD-10-CM

## 2019-09-18 NOTE — Progress Notes (Signed)
PATIENT NAME: [name] DOB: 14-Jul-1923 MRN: [id number]  PRIMARY CARE PROVIDER: Mayra Neer, MD  RESPONSIBLE PARTY: [name] (daughter) Acct ID - Guarantor Home Phone Work Phone Relationship Acct Type  [id number] JALACIA, [phone] Self P/F     [place], [place], [place] [address]   Covid-19 Pre-screening Negative  PLAN OF CARE and INTERVENTION:  1. ADVANCE CARE PLANNING/GOALS OF CARE: Goal is for patient to remain in her home. She has a DNR. 2. PATIENT/CAREGIVER EDUCATION: Symptom management, safe mobility/transfers, s/s of infection 3. DISEASE STATUS: Met with patient and her two daughter's, [name] and [name], in patient's home. Upon arrival, patient is sitting up in her recliner eating dinner. She is awake and alert, able to engage in conversation and make her needs known. She is forgetful and will repeat herself often. She has recently started on Memantine 5-10 mg titration pack to help with her memory loss. She has received 2 doses so far. She denies pain. No dyspnea noted or reported. She is ambulatory using her walker and able to dress, toilet, bathe and feed herself independently. No recent falls. Her daughter, [name], lives with her and makes sure that she eats and takes her medications. She receives Meals on Wheels. Patient is mainly giving herself sponge baths. Her daughter, [name], comes over daily and checks on her and administers her Insulin injections. [name] is looking into hiring a housekeeper for deep cleaning once a month. Patient has a good appetite. She does experience some coughing during meals, noted today with both solids and liquids. She takes her medications whole with water. She is continent of both bowel and bladder. She is sleeping well at night, w/o nightmares or pacing since taking Valium. No swelling noted. She spends most of her day sitting in her recliner watching TV. She has received both of her Covid-19 vaccinations w/o any side  effects. Will continue to monitor.   HISTORY OF PRESENT ILLNESS: This is a 86 yo female who resides in her home. Her daughter [name] lives with her and she has a very supportive family. Palliative care continues to follow patient and visits monthly and PRN.   CODE STATUS: DNR ADVANCED DIRECTIVES: N MOST FORM: no PPS: 50%   PHYSICAL EXAM:   VITALS: Today's Vitals   09/18/19 1621  BP: 132/69  Pulse: 66  Resp: 20  Temp: 97.6 F (36.4 C)  TempSrc: Temporal  SpO2: 93%  PainSc: 0-No pain    LUNGS: clear to auscultation  CARDIAC: Cor RRR EXTREMITIES: No edema SKIN: Thin/frail skin; exposed skin is dry and intact  NEURO: Alert and oriented x 2 (person/place), forgetful, generalized weakness, ambulatory w/walker   (Duration of visit and documentation 60 minutes)   Daryl Eastern, RN BSN

## 2019-10-21 ENCOUNTER — Other Ambulatory Visit: Payer: Self-pay

## 2019-10-21 ENCOUNTER — Other Ambulatory Visit: Payer: Medicare Other | Admitting: *Deleted

## 2019-10-21 DIAGNOSIS — Z515 Encounter for palliative care: Secondary | ICD-10-CM

## 2019-10-21 NOTE — Progress Notes (Signed)
COMMUNITY PALLIATIVE CARE RN NOTE  PATIENT NAME: [name] DOB: 06/12/1924 MRN: [id number]  PRIMARY CARE PROVIDER: Mayra Neer, MD  RESPONSIBLE PARTY: [name] (daughter) Acct ID - Guarantor Home Phone Work Phone Relationship Acct Type  [id number] BREEZIE, [phone] Self P/F     [place], [place], [place] [address]   Covid-19 Pre-screening Negative  PLAN OF CARE and INTERVENTION:  1. ADVANCE CARE PLANNING/GOALS OF CARE: Goal is for patient to remain in her home and avoid hospitalizations. She has a DNR. 2. PATIENT/CAREGIVER EDUCATION: Symptom management, safe mobility/transfers 3. DISEASE STATUS: Met with patient and her daughter, [name], in their home. Upon arrival, patient is sitting up in her recliner awake and alert. She is engaging in conversation. She is intermittently confused and forgetful. She makes repetitive statements several times throughout visit. She continues on Memantine daily, which was started last month. She denies pain. No dyspnea noted. She remains ambulatory using her walker. No recent falls. She gives herself baths and able to dress, toilet and feed herself independently. She reports sleeping well at night since taking Valium at bedtime. She denies any recent nightmares or wandering in the home at night. Her intake is good. She denies dysphagia, but I have noticed some coughing during meals on previous visits. She continues to receive Insulin each evening. Her daughter, [name], who usually gives her insulin is currently out of town and she has asked me to administer patient's insulin this week. Administered insulin today in the left upper quadrant of her abdomen and will do so for the remainder of the week. Patient tolerated this well. Will continue to monitor.   HISTORY OF PRESENT ILLNESS:  This is a 86 yo female who resides in her home. Her daughter, [name], lives with her and makes sure patient has her meals and takes her medications.  She has a very supportive family. Palliative care team continues to follow patient. Will visit patient this week daily to administer her insulin, but will then resume monthly visits when [name] returns.   CODE STATUS: DNR ADVANCED DIRECTIVES: N MOST FORM: no PPS: 50%   PHYSICAL EXAM:   VITALS: Today's Vitals   10/21/19 1653  BP: 124/68  Pulse: 66  Resp: 19  Temp: (!) 97.2 F (36.2 C)  TempSrc: Temporal  SpO2: 97%  PainSc: 0-No pain    LUNGS: clear to auscultation  CARDIAC: Cor RRR EXTREMITIES: No edema SKIN: Exposed skin is dry and intact  NEURO: Alert and oriented x 2 (person/place), intermittent confusion, forgetful, generalized weakness, ambulatory w/walker   (Duration of visit and documentation 45 minutes)   Daryl Eastern, RN BSN

## 2019-10-22 ENCOUNTER — Other Ambulatory Visit: Payer: Self-pay

## 2019-10-22 ENCOUNTER — Other Ambulatory Visit: Payer: Medicare Other | Admitting: *Deleted

## 2019-10-22 DIAGNOSIS — Z515 Encounter for palliative care: Secondary | ICD-10-CM

## 2019-10-23 ENCOUNTER — Other Ambulatory Visit: Payer: Medicare Other | Admitting: *Deleted

## 2019-10-23 ENCOUNTER — Other Ambulatory Visit: Payer: Self-pay

## 2019-10-23 DIAGNOSIS — Z515 Encounter for palliative care: Secondary | ICD-10-CM

## 2019-10-23 NOTE — Progress Notes (Signed)
COMMUNITY PALLIATIVE CARE RN NOTE  PATIENT NAME: [name] DOB: [birth year] MRN: [id number]  PRIMARY CARE PROVIDER: Lupita Raider, MD  RESPONSIBLE PARTY: Blondell Reveal (daughter) Acct ID - Guarantor Home Phone Work Phone Relationship Acct Type  [id number] [name], [name]* [phone] Self P/F     [address], [place], [place] [address]   Covid-19 Pre-screening Negative  RN visit made as requested this week by patient's daughter, [name], to administer patient's insulin injection while she is out of town. Insulin given in patient's left upper abdomen. Patient tolerated well. Will visit again tomorrow to do the same.    CODE STATUS: DNR ADVANCED DIRECTIVES: N MOST FORM: no PPS: 50%   (Duration of visit and documentation 15 minutes)   Candiss Norse, RN BSN

## 2019-10-23 NOTE — Progress Notes (Signed)
COMMUNITY PALLIATIVE CARE RN NOTE  PATIENT NAME: [name] DOB: [birth year] MRN: [id number]  PRIMARY CARE PROVIDER: Lupita Raider, MD  RESPONSIBLE PARTY: Blondell Reveal (daughter) Acct ID - Guarantor Home Phone Work Phone Relationship Acct Type  [id number] [name], [name] [phone] Self P/F     [address], [place], [place] [address]   Covid-19 Pre-screening Negative  RN visit made as requested this week by patient's daughter, [name], to administer patient's insulin injection while she is out of town. Insulin given in patient's right upper abdomen. Patient tolerated well. Will visit again tomorrow to do the same.   CODE STATUS: DNR ADVANCED DIRECTIVES: N MOST FORM: no PPS: 50%   (Duration of visit and documentation 15 minutes)   Candiss Norse, RN BSN

## 2019-10-24 ENCOUNTER — Other Ambulatory Visit: Payer: Self-pay

## 2019-10-24 ENCOUNTER — Other Ambulatory Visit: Payer: Medicare Other | Admitting: *Deleted

## 2019-10-24 DIAGNOSIS — Z515 Encounter for palliative care: Secondary | ICD-10-CM

## 2019-10-24 NOTE — Progress Notes (Signed)
COMMUNITY PALLIATIVE CARE RN NOTE  PATIENT NAME: [name] DOB: 09/10/1923 MRN: [id number]  PRIMARY CARE PROVIDER: Shaw, Kimberlee, MD  RESPONSIBLE PARTY: [name] (daughter) Acct ID - Guarantor Home Phone Work Phone Relationship Acct Type  [id number] - [name],[name]* [phone] Self P/F     [address], [place], [place] [address]   Covid-19 Pre-screening Negative  RN visit made as requested this week by patient's daughter, Mikayla, to administer patient's insulin injection while she is out of town. Insulin given in patient's right upper abdomen. Patient tolerated well. Will visit again tomorrow to do the same.   CODE STATUS: DNR ADVANCED DIRECTIVES: N MOST FORM: no PPS: 50%   (Duration of visit and documentation 15 minutes)   Juliene Kirsh, RN BSN 

## 2019-10-25 ENCOUNTER — Other Ambulatory Visit: Payer: Self-pay

## 2019-10-25 ENCOUNTER — Other Ambulatory Visit: Payer: Medicare Other | Admitting: *Deleted

## 2019-10-25 DIAGNOSIS — Z515 Encounter for palliative care: Secondary | ICD-10-CM

## 2019-10-27 NOTE — Progress Notes (Signed)
COMMUNITY PALLIATIVE CARE RN NOTE  PATIENT NAME: [name] DOB: 1924/04/22 MRN: [id number]  PRIMARY CARE PROVIDER: Lupita Raider, MD  RESPONSIBLE PARTY: Blondell Reveal (daughter) Acct ID - Guarantor Home Phone Work Phone Relationship Acct Type  [id number] KANYA, POTTEIGER* [phone] Self P/F     [address], [place], [place] [address]   Covid-19 Pre-screening Negative  RN visit made as requested this week by patient's daughter, [name], to administer patient's insulin injection while she is out of town. Insulin given in patient'sleftupper abdomen. Patient tolerated well.Will go back to visiting with patient monthly and PRN. [name] is to return back to [facility].     CODE STATUS: DNR ADVANCED DIRECTIVES: N MOST FORM: no PPS: 50%   (Duration of visit and documentation 15 minutes)   Candiss Norse, RN BSN

## 2019-11-14 ENCOUNTER — Other Ambulatory Visit: Payer: Self-pay

## 2019-11-14 ENCOUNTER — Other Ambulatory Visit: Payer: Medicare Other | Admitting: *Deleted

## 2019-11-14 DIAGNOSIS — Z515 Encounter for palliative care: Secondary | ICD-10-CM

## 2019-11-18 NOTE — Progress Notes (Signed)
COMMUNITY PALLIATIVE CARE RN NOTE  PATIENT NAME: [name] DOB: 1924/02/16 MRN: [id number]  PRIMARY CARE PROVIDER: Mayra Neer, MD  RESPONSIBLE PARTY: [name] (daughter) Acct ID - Guarantor Home Phone Work Phone Relationship Acct Type  [id number] HAYDYN, [phone] Self P/F     [name], [place], [name] [address]   Covid-19 Pre-screening Negative  PLAN OF CARE and INTERVENTION:  1. ADVANCE CARE PLANNING/GOALS OF CARE: Goal is for patient to remain in her home. She has a DNR. 2. PATIENT/CAREGIVER EDUCATION: Symptom management, safe mobility/transfers, fall prevention 3. DISEASE STATUS: Met with patient and her two daughter's, [name] and [name], in patient's home. Observed patient ambulate back from the bathroom to her chair. She walks leaning forward over her walker. Her gait is unsteady at times. She had a recent unwitnessed fall in her bedroom last week, stating she woke up on the floor. Patient is unsure how she got there, falling vs rolling out of the bed. No apparent injuries. She walks around barefoot at all times and refuses to put on shoes or non-slip socks. Most of the time she is sleeping well during the night, after taking her Valium. She denies any recent nightmares which she is pleased with. She remains intermittently confused and makes repetitive statements. She is able to make her needs known. She continues on Memantine for her dementia. Her intake remains good. No coughing noted during her meal today. She does have instances where she will cough occasionally while eating/drinking. Meals on wheels is now bringing patient warm meals daily. She takes insulin 10 units every evening. Her blood sugars are checked periodically. She remains able to dress, bathe and toilet herself independently. She continues with intermittent bladder incontinence and wears Depends. No edema noted. Will continue to monitor.  HISTORY OF PRESENT ILLNESS: This is a 86 yo  female who resides in her home. Her daughter [name] lives with her and she has a very supportive family. Palliative care continues to follow patient and visits monthly and PRN.    CODE STATUS: DNR ADVANCED DIRECTIVES: N MOST FORM: no PPS: 50%   PHYSICAL EXAM:   VITALS: Today's Vitals   11/14/19 1525  BP: 122/70  Pulse: 69  Resp: 17  Temp: (!) 97.5 F (36.4 C)  TempSrc: Temporal  SpO2: 100%  PainSc: 0-No pain    LUNGS: clear to auscultation  CARDIAC: Cor RRR EXTREMITIES: No edema SKIN: Exposed skin is dry and intact  NEURO: Alert and oriented to person/place, poor short term memory, repetitive, generalized weakness, ambulatory w/walker   (Duration of visit and documentation 60 minutes)   Daryl Eastern, RN BSN

## 2019-12-03 DIAGNOSIS — I10 Essential (primary) hypertension: Secondary | ICD-10-CM | POA: Diagnosis not present

## 2019-12-03 DIAGNOSIS — E782 Mixed hyperlipidemia: Secondary | ICD-10-CM | POA: Diagnosis not present

## 2019-12-03 DIAGNOSIS — E1151 Type 2 diabetes mellitus with diabetic peripheral angiopathy without gangrene: Secondary | ICD-10-CM | POA: Diagnosis not present

## 2019-12-03 DIAGNOSIS — F0390 Unspecified dementia without behavioral disturbance: Secondary | ICD-10-CM | POA: Diagnosis not present

## 2019-12-03 DIAGNOSIS — I700 Atherosclerosis of aorta: Secondary | ICD-10-CM | POA: Diagnosis not present

## 2019-12-10 ENCOUNTER — Encounter (HOSPITAL_COMMUNITY): Payer: Self-pay | Admitting: Student

## 2019-12-10 ENCOUNTER — Other Ambulatory Visit: Payer: Self-pay

## 2019-12-10 ENCOUNTER — Emergency Department (HOSPITAL_COMMUNITY): Payer: Medicare Other

## 2019-12-10 ENCOUNTER — Emergency Department (HOSPITAL_COMMUNITY)
Admission: EM | Admit: 2019-12-10 | Discharge: 2019-12-10 | Disposition: A | Discharge status: Home / Self Care | Payer: Medicare Other | Attending: Emergency Medicine | Admitting: Emergency Medicine

## 2019-12-10 DIAGNOSIS — Z23 Encounter for immunization: Secondary | ICD-10-CM | POA: Diagnosis not present

## 2019-12-10 DIAGNOSIS — Z7984 Long term (current) use of oral hypoglycemic drugs: Secondary | ICD-10-CM | POA: Insufficient documentation

## 2019-12-10 DIAGNOSIS — Y929 Unspecified place or not applicable: Secondary | ICD-10-CM | POA: Diagnosis not present

## 2019-12-10 DIAGNOSIS — I110 Hypertensive heart disease with heart failure: Secondary | ICD-10-CM | POA: Insufficient documentation

## 2019-12-10 DIAGNOSIS — W19XXXA Unspecified fall, initial encounter: Secondary | ICD-10-CM | POA: Diagnosis not present

## 2019-12-10 DIAGNOSIS — Z79899 Other long term (current) drug therapy: Secondary | ICD-10-CM | POA: Diagnosis not present

## 2019-12-10 DIAGNOSIS — S0990XA Unspecified injury of head, initial encounter: Secondary | ICD-10-CM

## 2019-12-10 DIAGNOSIS — Y999 Unspecified external cause status: Secondary | ICD-10-CM | POA: Diagnosis not present

## 2019-12-10 DIAGNOSIS — S0181XA Laceration without foreign body of other part of head, initial encounter: Secondary | ICD-10-CM | POA: Insufficient documentation

## 2019-12-10 DIAGNOSIS — J449 Chronic obstructive pulmonary disease, unspecified: Secondary | ICD-10-CM | POA: Insufficient documentation

## 2019-12-10 DIAGNOSIS — Y939 Activity, unspecified: Secondary | ICD-10-CM | POA: Insufficient documentation

## 2019-12-10 DIAGNOSIS — I5032 Chronic diastolic (congestive) heart failure: Secondary | ICD-10-CM | POA: Diagnosis not present

## 2019-12-10 MED ORDER — TETANUS-DIPHTH-ACELL PERTUSSIS 5-2.5-18.5 LF-MCG/0.5 IM SUSP
0.5000 mL | Freq: Once | INTRAMUSCULAR | Status: AC
  Administered 2019-12-10: 0.5 mL via INTRAMUSCULAR
  Filled 2019-12-10: qty 0.5

## 2019-12-10 NOTE — Progress Notes (Signed)
TOC CM spoke to grandson, [employer] at bedside. States pt's dtr, Blondell Reveal (primary caregiver) is out of town and he was going by the home in the evening to give pt her meds. Pt was outside and fell on concrete driveway. Lucila [place] states pt knows she is not to go outside but with memory problems she forgets. Pt wants to stay in her home but CM spoke to dtr via phone and they are planning ALF/Memory Care or private duty caregiver. Pt's other dtr lives in the home and her ability to assist is limited. Pt has cane and RW in the home. Dtr is currently in [place] but plans to travel back tomorrow home to care for pt. Lucila [place] states he will work out plan for tomorrow because he has a to work. Provided grandson and dtr with list of ALF/Memory Care and gave names of personal care agencies for private duty aide.Isidoro Donning RN CCM, [facility] ED TOC CM [phone]

## 2019-12-10 NOTE — ED Notes (Signed)
Patient transported to CT 

## 2019-12-10 NOTE — Discharge Instructions (Addendum)
Keep wound clean and dry. Glue will fall off on its own. She may have headaches, repetitive questioning, nausea, or balance problems following the head injury. These would suggest post-concussion symptoms. They should get better with time, however if she has sudden change in symptoms or vomiting/lethargy/severe pain, please return immediately to ER.

## 2019-12-10 NOTE — ED Notes (Signed)
Patient back from CT.  CSW at bedside speaking with grandson.

## 2019-12-10 NOTE — ED Notes (Signed)
Patient arrives via [facility] after an unwitnessed fall in the driveway.  Patient lives with family.  Hx of afib and dementia.  Unknown if she takes blood thinners.  Patient has a laceration to her left forehead with blood down her face.

## 2019-12-10 NOTE — ED Provider Notes (Signed)
[facility]-EMERGENCY DEPT Provider Note   CSN: [id number] Arrival date & time: 12/10/19  1640     History Chief Complaint  Patient presents with   Fall   Laceration    [name] is a 86 y.o. female.  86 year old female with past medical history below including dementia, COPD, type 2 diabetes mellitus, hypertension, chronic cystitis who presents with fall and head injury.  This afternoon, the patient had an unwitnessed fall in her driveway.  Family reports that she is not supposed to go outside but went outside by herself and apparently fell, striking her head on concrete.  Patient denying any complaints of pain.  LEVEL 5 CAVEAT DUE TO DEMENTIA  The history is provided by a relative.  Fall  Laceration      Past Medical History:  Diagnosis Date   Aortic atherosclerosis (HCC)    on [place] 05/13/13   Aortic dissection (HCC)    Dr. Zenaida Niece Tright   Chronic cystitis    Dr. Sherron Monday   COPD (chronic obstructive pulmonary disease) (HCC)    Diabetes mellitus without complication (HCC)    Hyperlipidemia    Hypertension    Osteoporosis    giant cell arteritis, Dr. Kellie Simmering    Patient Active Problem List   Diagnosis Date Noted   HLD (hyperlipidemia) 04/29/2017   Chest pain 04/29/2017   Intramural aortic hematoma (HCC) 04/29/2017   Syncope 09/20/2016   Hyponatremia 09/20/2016   Acute cystitis without hematuria 09/20/2016   Dehydration 09/20/2016   Type 2 diabetes mellitus with complication, without long-term current use of insulin (HCC) 09/20/2016   Elevated troponin 09/20/2016   Essential hypertension 09/20/2016   Chronic diastolic CHF (congestive heart failure) (HCC) 09/20/2016   Sepsis, unspecified organism (HCC) 09/20/2016   Acute lower UTI 09/20/2016   Palpitations 08/01/2013    Past Surgical History:  Procedure Laterality Date   aortic acrh surgery       OB History   No obstetric history on file.     Family  History  Problem Relation Age of Onset   Heart attack Father    Bone cancer Sister     Social History   Tobacco Use   Smoking status: Never Smoker   Smokeless tobacco: Never Used  Substance Use Topics   Alcohol use: No   Drug use: No    Home Medications Prior to Admission medications   Medication Sig Start Date End Date Taking? Authorizing Provider  atorvastatin (LIPITOR) 20 MG tablet Take 20 mg by mouth every morning.     [provider]  calcium carbonate 200 MG capsule Take 2,000 mg by mouth every morning.     [provider]  Cholecalciferol (VITAMIN D) 2000 UNITS tablet Take 2,000 Units by mouth every morning.     [provider]  furosemide (LASIX) 20 MG tablet Take 20 mg by mouth every morning.     [provider]  metFORMIN (GLUCOPHAGE) 500 MG tablet Take 500 mg by mouth 2 (two) times daily. 08/29/16   [provider]  metoprolol succinate (TOPROL-XL) 50 MG 24 hr tablet Take 50 mg by mouth daily. Take with or immediately following a meal.    [provider]  Multiple Vitamins-Minerals (MULTIVITAMIN PO) Take 1 tablet by mouth every morning.     [provider]  Omega-3 Fatty Acids (FISH OIL) 1000 MG CAPS Take 1,000 mg by mouth daily.     [provider]  potassium chloride (K-DUR) 10 MEQ tablet  Take 1 tablet (10 mEq total) by mouth daily. 09/23/16   Arrien, Jimmy Picket, MD  trimethoprim (TRIMPEX) 100 MG tablet Take 100 mg by mouth daily.    [provider]    Allergies    Actonel [risedronate sodium], Codeine, Lipitor [atorvastatin], Macrobid [nitrofurantoin monohyd macro], and Septra [sulfamethoxazole-trimethoprim]  Review of Systems   Review of Systems  Unable to perform ROS: Dementia    Physical Exam Updated Vital Signs BP (!) 160/80 (BP Location: Left Arm)    Pulse 76    Temp 98.3 F (36.8 C) (Oral)    Resp 17    SpO2 96%   Physical Exam Vitals and nursing note reviewed.    Constitutional:      General: She is not in acute distress.    Appearance: She is well-developed.  HENT:     Head: Normocephalic.     Comments: Hematoma L forehead w/ abrasions and one small 0.5cm superficial linear laceration    Nose:     Comments: Small abrasion bridge of nose Eyes:     Conjunctiva/sclera: Conjunctivae normal.     Pupils: Pupils are equal, round, and reactive to light.  Cardiovascular:     Rate and Rhythm: Normal rate and regular rhythm.     Heart sounds: Normal heart sounds.  Pulmonary:     Effort: Pulmonary effort is normal.     Breath sounds: Normal breath sounds.  Abdominal:     General: Bowel sounds are normal. There is no distension.     Palpations: Abdomen is soft.     Tenderness: There is no abdominal tenderness.  Musculoskeletal:        General: No swelling or tenderness. Normal range of motion.     Cervical back: Neck supple.  Skin:    General: Skin is warm and dry.  Neurological:     Mental Status: She is alert.     Comments: Fluent speech, oriented x 1, repetitive statements but able to follow basic commands     ED Results / Procedures / Treatments   Labs (all labs ordered are listed, but only abnormal results are displayed) Labs Reviewed - No data to display  EKG None  Radiology CT Head Wo Contrast  Result Date: 12/10/2019 CLINICAL DATA:  Unwitnessed fall EXAM: CT HEAD WITHOUT CONTRAST TECHNIQUE: Contiguous axial images were obtained from the base of the skull through the vertex without intravenous contrast. COMPARISON:  09/19/2016 FINDINGS: Brain: There is atrophy and chronic small vessel disease changes. No acute intracranial abnormality. Specifically, no hemorrhage, hydrocephalus, mass lesion, acute infarction, or significant intracranial injury. Vascular: No hyperdense vessel or unexpected calcification. Skull: No acute calvarial abnormality. Sinuses/Orbits: Visualized paranasal sinuses and mastoids clear. Orbital soft tissues  unremarkable. Other: Soft tissue swelling and laceration over the left forehead. IMPRESSION: Atrophy, chronic microvascular disease. No acute intracranial abnormality. Electronically Signed   By: Rolm Baptise M.D.   On: 12/10/2019 19:11   CT Cervical Spine Wo Contrast  Result Date: 12/10/2019 CLINICAL DATA:  Fall EXAM: CT CERVICAL SPINE WITHOUT CONTRAST TECHNIQUE: Multidetector CT imaging of the cervical spine was performed without intravenous contrast. Multiplanar CT image reconstructions were also generated. COMPARISON:  None. FINDINGS: Alignment: 2 mm of anterolisthesis of C3 on C4 and C4-C5 as well as C6 on C7 and C7 on T1 related to facet disease. Skull base and vertebrae: No acute fracture. No primary bone lesion or focal pathologic process. Soft tissues and spinal canal: No prevertebral fluid or swelling. No visible canal hematoma.  Disc levels: Degenerative disc disease with disc space narrowing and spurring at C5-6 and C6-7. Moderate to advanced degenerative facet disease bilaterally diffusely, left greater than right. Upper chest: No acute findings Other: Carotid artery calcifications. IMPRESSION: Degenerative disc and facet disease with multilevel anterolisthesis. No acute bony abnormality. Electronically Signed   By: Charlett Nose M.D.   On: 12/10/2019 19:17    Procedures .Marland KitchenLaceration Repair  Date/Time: 12/10/2019 8:02 PM Performed by: Laurence Spates, MD Authorized by: Laurence Spates, MD   Consent:    Consent obtained:  Verbal   Consent given by:  Guardian   Risks discussed:  Infection, pain and poor cosmetic result Anesthesia (see MAR for exact dosages):    Anesthesia method:  None Laceration details:    Location:  Face   Face location:  Forehead   Length (cm):  0.5 Repair type:    Repair type:  Simple Treatment:    Area cleansed with:  Shur-Clens   Amount of cleaning:  Standard Skin repair:    Repair method:  Tissue adhesive Approximation:    Approximation:   Close Post-procedure details:    Dressing:  Open (no dressing)   Patient tolerance of procedure:  Tolerated well, no immediate complications   (including critical care time)  Medications Ordered in ED Medications  Tdap (BOOSTRIX) injection 0.5 mL (0.5 mLs Intramuscular Given 12/10/19 1744)    ED Course  I have reviewed the triage vital signs and the nursing notes.  Pertinent labs & imaging results that were available during my care of the patient were reviewed by me and considered in my medical decision making (see chart for details).    MDM Rules/Calculators/A&P                          Pt alert, comfortable on exam.  Because of unwitnessed fall and evidence of head injury, obtain CT of head and C-spine which were negative for acute process.  Updated tetanus.  Closed small laceration with Dermabond, see procedure note.  Family noted problems with patient wandering outside when unsupervised and requested social work for assistance for placement. Cathlean Cower, ED CM, spoke with family, but she unfortunately does not meet admission criteria for the hospital.   I discussed supportive measures for head and reviewed return precautions w/ family. I also reviewed typical symptoms for post-concussive syndrome and when to seek immediate medical attention.  Final Clinical Impression(s) / ED Diagnoses Final diagnoses:  Injury of head, initial encounter  Laceration of forehead, initial encounter    Rx / DC Orders ED Discharge Orders    None       Kadin Bera, Ambrose Finland, MD 12/10/19 2003

## 2019-12-11 ENCOUNTER — Telehealth: Payer: Self-pay | Admitting: *Deleted

## 2019-12-11 NOTE — Telephone Encounter (Signed)
Received a call from patient's daughter, [name], stating that patient got outside yesterday, fell and hit her head on the concrete. She was transported to the ED by EMS,  treated and released back to her home. She has now hired caregivers to sit with her mom from 8:30a to 4:30p, and she will then stay with her in the evenings and throughout the night. She is requesting a list of Assisted Living facilities with Memory care units as another possible option. Will reach out to our social worker and provide [name] with a list. She is Adult nurse.

## 2019-12-18 ENCOUNTER — Telehealth: Payer: Self-pay | Admitting: *Deleted

## 2019-12-18 NOTE — Telephone Encounter (Signed)
Called patient's daughter, [name], to schedule a Palliative care home visit. Her voicemail box is currently full, so I sent a communication to request.

## 2019-12-19 ENCOUNTER — Telehealth: Payer: Self-pay | Admitting: *Deleted

## 2019-12-19 NOTE — Telephone Encounter (Signed)
Received a return communication from patient's daughter, [name], in regards to scheduling a Palliative care home visit. She advised that patient will be moving to [facility] on their Memory Care unit this Friday, 12/20/19. Visit scheduled at the facility on 12/25/19@9 :30a.

## 2019-12-25 ENCOUNTER — Non-Acute Institutional Stay: Payer: Medicare Other | Admitting: *Deleted

## 2019-12-25 ENCOUNTER — Other Ambulatory Visit: Payer: Self-pay

## 2019-12-25 VITALS — BP 127/71 | HR 60 | Temp 97.2°F | Resp 19

## 2019-12-25 DIAGNOSIS — Z515 Encounter for palliative care: Secondary | ICD-10-CM

## 2019-12-25 NOTE — Progress Notes (Signed)
COMMUNITY PALLIATIVE CARE RN NOTE  PATIENT NAME: [name] DOB: 10-07-1923 MRN: [id number]  PRIMARY CARE PROVIDER: Mayra Neer, MD  RESPONSIBLE PARTY: [name] (daughter) Acct ID - Guarantor Home Phone Work Phone Relationship Acct Type  [id number] CARMENCITA, [phone] Self P/F     [address], [place], [place] [address]   Covid-19 Pre-screening Negative  PLAN OF CARE and INTERVENTION:  1. ADVANCE CARE PLANNING/GOALS OF CARE: Goal is for patient to begin to adjust to now being in a facility. She has a DNR. 2. PATIENT/CAREGIVER EDUCATION: Symptom management, Pain management, safe mobility/transfers, fall prevention 3. DISEASE STATUS: Patient recently moved from her home to [facility] facility on their Memory care unit. She had a fall outside at her home on 12/10/19 and hit her head on the concrete. She was treated and released back to her home. She sustained a knot and bruising to her left forehead along with bruising around both eyes. This has since healed. Her daughter [name] says that she had a concussion. [name] tried starting off with hiring caregivers to be in the home with her during the day, and she would stay with patient during the night. She then felt that she would be safer at a facility. Met with patient and [name] in patient's room at the facility. Upon arrival, patient is making up her bed. She did not have her walker close by, but held onto furniture in order to get to her recliner and sit down. Gait is unsteady. Patient is very tearful on and off throughout the visit. She notices that she is declining and says it is depressing looking at other residents that are currently in a worse condition than she is at this time. She feels that this is how she will be in time. She is having a difficult time adjusting. She is also intermittently confused, which is her baseline. She is very repetitive and fixated on falling outside. And also in patient's mind, she  was the one who drove herself to the facility to live. She moved in on 12/20/19. She does require assistance with showers. She has occasional bladder incontinence and wears Depends. She is able to feed herself independently. [name] had a discussion with facility staff regarding Valium and requests that patient be able to take this medication 3 times per day for the next few weeks d/t her anxiety and tearfulness until she is better adjusted. Also, according to the [facility], the order for Valium says 1-3 tablets po twice daily PRN and they need this to be clarified. They asked that I reach out to her PCP. I called and spoke with the receptionist and advised of her daughter's wishes and that the facility needed this order faxed to them. She will get this request to Dr. Brigitte Pulse to have her fax new instructions. I let the facility staff know. Will continue to monitor.  HISTORY OF PRESENT ILLNESS: This is a 86 yo female with a diagnosis of dementia. She now resides at [facility]. Palliative care continues to follow patient and visits monthly and PRN.    CODE STATUS: DNR  ADVANCED DIRECTIVES: N MOST FORM: no PPS: 50%   PHYSICAL EXAM:   VITALS: Today's Vitals   12/25/19 1007  BP: 127/71  Pulse: 60  Resp: 19  Temp: (!) 97.2 F (36.2 C)  TempSrc: Temporal  SpO2: 99%  PainSc: 2   PainLoc: Neck    LUNGS: clear to auscultation  CARDIAC: Cor RRR EXTREMITIES:  Trace edema to bilateral lower extremities SKIN: Thin/frail skin; exposed skin is dry and intact  NEURO: Alert and oriented to person/place, intermittent confusion, repetitive, tearful, generalized weakness, ambulatory w/walker    (Duration of visit and documentation 90 minutes)   Daryl Eastern, RN BSN

## 2020-01-08 ENCOUNTER — Non-Acute Institutional Stay: Payer: Medicare Other | Admitting: *Deleted

## 2020-01-08 DIAGNOSIS — Z515 Encounter for palliative care: Secondary | ICD-10-CM

## 2020-01-09 ENCOUNTER — Other Ambulatory Visit: Payer: Self-pay

## 2020-01-22 DIAGNOSIS — H8110 Benign paroxysmal vertigo, unspecified ear: Secondary | ICD-10-CM | POA: Diagnosis not present

## 2020-01-22 DIAGNOSIS — J449 Chronic obstructive pulmonary disease, unspecified: Secondary | ICD-10-CM | POA: Diagnosis not present

## 2020-01-22 DIAGNOSIS — I5032 Chronic diastolic (congestive) heart failure: Secondary | ICD-10-CM | POA: Diagnosis not present

## 2020-01-22 DIAGNOSIS — Z9181 History of falling: Secondary | ICD-10-CM | POA: Diagnosis not present

## 2020-01-22 DIAGNOSIS — E1151 Type 2 diabetes mellitus with diabetic peripheral angiopathy without gangrene: Secondary | ICD-10-CM | POA: Diagnosis not present

## 2020-01-22 DIAGNOSIS — Z794 Long term (current) use of insulin: Secondary | ICD-10-CM | POA: Diagnosis not present

## 2020-01-22 DIAGNOSIS — M810 Age-related osteoporosis without current pathological fracture: Secondary | ICD-10-CM | POA: Diagnosis not present

## 2020-01-22 DIAGNOSIS — F0390 Unspecified dementia without behavioral disturbance: Secondary | ICD-10-CM | POA: Diagnosis not present

## 2020-01-22 DIAGNOSIS — Z8679 Personal history of other diseases of the circulatory system: Secondary | ICD-10-CM | POA: Diagnosis not present

## 2020-01-22 DIAGNOSIS — I700 Atherosclerosis of aorta: Secondary | ICD-10-CM | POA: Diagnosis not present

## 2020-01-22 DIAGNOSIS — G8929 Other chronic pain: Secondary | ICD-10-CM | POA: Diagnosis not present

## 2020-01-22 DIAGNOSIS — R32 Unspecified urinary incontinence: Secondary | ICD-10-CM | POA: Diagnosis not present

## 2020-01-22 DIAGNOSIS — I110 Hypertensive heart disease with heart failure: Secondary | ICD-10-CM | POA: Diagnosis not present

## 2020-01-22 DIAGNOSIS — M1991 Primary osteoarthritis, unspecified site: Secondary | ICD-10-CM | POA: Diagnosis not present

## 2020-01-22 DIAGNOSIS — E782 Mixed hyperlipidemia: Secondary | ICD-10-CM | POA: Diagnosis not present

## 2020-01-22 DIAGNOSIS — I714 Abdominal aortic aneurysm, without rupture: Secondary | ICD-10-CM | POA: Diagnosis not present

## 2020-01-22 DIAGNOSIS — Z87440 Personal history of urinary (tract) infections: Secondary | ICD-10-CM | POA: Diagnosis not present

## 2020-01-22 DIAGNOSIS — B351 Tinea unguium: Secondary | ICD-10-CM | POA: Diagnosis not present

## 2020-01-22 DIAGNOSIS — R202 Paresthesia of skin: Secondary | ICD-10-CM | POA: Diagnosis not present

## 2020-01-22 DIAGNOSIS — E11319 Type 2 diabetes mellitus with unspecified diabetic retinopathy without macular edema: Secondary | ICD-10-CM | POA: Diagnosis not present

## 2020-01-27 DIAGNOSIS — F0390 Unspecified dementia without behavioral disturbance: Secondary | ICD-10-CM | POA: Diagnosis not present

## 2020-01-27 DIAGNOSIS — R296 Repeated falls: Secondary | ICD-10-CM | POA: Diagnosis not present

## 2020-01-27 DIAGNOSIS — J449 Chronic obstructive pulmonary disease, unspecified: Secondary | ICD-10-CM | POA: Diagnosis not present

## 2020-01-27 DIAGNOSIS — E1151 Type 2 diabetes mellitus with diabetic peripheral angiopathy without gangrene: Secondary | ICD-10-CM | POA: Diagnosis not present

## 2020-01-27 DIAGNOSIS — E11319 Type 2 diabetes mellitus with unspecified diabetic retinopathy without macular edema: Secondary | ICD-10-CM | POA: Diagnosis not present

## 2020-01-27 DIAGNOSIS — I5032 Chronic diastolic (congestive) heart failure: Secondary | ICD-10-CM | POA: Diagnosis not present

## 2020-01-27 DIAGNOSIS — I110 Hypertensive heart disease with heart failure: Secondary | ICD-10-CM | POA: Diagnosis not present

## 2020-01-27 DIAGNOSIS — Z7189 Other specified counseling: Secondary | ICD-10-CM | POA: Diagnosis not present

## 2020-01-27 NOTE — Progress Notes (Signed)
COMMUNITY PALLIATIVE CARE RN NOTE  PATIENT NAME: [name] DOB: [birth year] MRN: [id number]  PRIMARY CARE PROVIDER: Mayra Neer, MD  RESPONSIBLE PARTY: [name] (daughter) Acct ID - Guarantor Home Phone Work Phone Relationship Acct Type  [id number] [name], [name] Self P/F     [address], [place], [place] [address]   Covid-19 Pre-screening Negative  PLAN OF CARE and INTERVENTION:  1. ADVANCE CARE PLANNING/GOALS OF CARE: Goal is for patient to remain at current AL facility. 2. PATIENT/CAREGIVER EDUCATION: Symptom management, safe mobility/transfers 3. DISEASE STATUS: Met with patient in the dining room at the facility. Upon arrival, she is sitting up in her wheelchair with other residents participating in an activity. She appears to be adjusting well to being at [facility] on their Memory care unit. She is alert and oriented x 2 (person/place), able to answer questions and make her needs known. She is pleasant and engaging. She denies pain. No recent falls. She is now on scheduled Valium which has been working well. Staff reports patient has been more calm overall. She is ambulatory using a walker. Facility using a wheelchair for transport outside of her room. She requires assistance with bathing and dressing. Her intake is good. No skin issues. Chart reviewed. Will continue to monitor.   HISTORY OF PRESENT ILLNESS: This is a 86 yo female with a diagnosis of dementia. She resides at [facility] care unit due to requiring a higher level of care. She has been here for about 3 weeks. Palliative care continues to follow patient and visits monthly and PRN.    CODE STATUS: DNR ADVANCED DIRECTIVES: N MOST FORM: no PPS: 40%   PHYSICAL EXAM:   LUNGS: clear to auscultation  CARDIAC: Cor RRR EXTREMITIES: No edema SKIN: Exposed skin is dry and intact  NEURO: Alert and oriented x 2, pleasant mood, generalized weakness, ambulatory w/walker  short distances   (Duration of visit and documentation 45 minutes)   Daryl Eastern, RN BSN

## 2020-01-31 ENCOUNTER — Other Ambulatory Visit: Payer: Self-pay

## 2020-01-31 ENCOUNTER — Non-Acute Institutional Stay: Payer: Medicare Other

## 2020-01-31 DIAGNOSIS — Z515 Encounter for palliative care: Secondary | ICD-10-CM

## 2020-01-31 DIAGNOSIS — F0390 Unspecified dementia without behavioral disturbance: Secondary | ICD-10-CM | POA: Diagnosis not present

## 2020-01-31 DIAGNOSIS — E11319 Type 2 diabetes mellitus with unspecified diabetic retinopathy without macular edema: Secondary | ICD-10-CM | POA: Diagnosis not present

## 2020-01-31 DIAGNOSIS — I110 Hypertensive heart disease with heart failure: Secondary | ICD-10-CM | POA: Diagnosis not present

## 2020-01-31 DIAGNOSIS — E1151 Type 2 diabetes mellitus with diabetic peripheral angiopathy without gangrene: Secondary | ICD-10-CM | POA: Diagnosis not present

## 2020-01-31 DIAGNOSIS — I5032 Chronic diastolic (congestive) heart failure: Secondary | ICD-10-CM | POA: Diagnosis not present

## 2020-01-31 DIAGNOSIS — J449 Chronic obstructive pulmonary disease, unspecified: Secondary | ICD-10-CM | POA: Diagnosis not present

## 2020-02-03 DIAGNOSIS — I110 Hypertensive heart disease with heart failure: Secondary | ICD-10-CM | POA: Diagnosis not present

## 2020-02-03 DIAGNOSIS — E1151 Type 2 diabetes mellitus with diabetic peripheral angiopathy without gangrene: Secondary | ICD-10-CM | POA: Diagnosis not present

## 2020-02-03 DIAGNOSIS — E11319 Type 2 diabetes mellitus with unspecified diabetic retinopathy without macular edema: Secondary | ICD-10-CM | POA: Diagnosis not present

## 2020-02-03 DIAGNOSIS — I5032 Chronic diastolic (congestive) heart failure: Secondary | ICD-10-CM | POA: Diagnosis not present

## 2020-02-03 DIAGNOSIS — J449 Chronic obstructive pulmonary disease, unspecified: Secondary | ICD-10-CM | POA: Diagnosis not present

## 2020-02-03 DIAGNOSIS — F0390 Unspecified dementia without behavioral disturbance: Secondary | ICD-10-CM | POA: Diagnosis not present

## 2020-02-04 DIAGNOSIS — I110 Hypertensive heart disease with heart failure: Secondary | ICD-10-CM | POA: Diagnosis not present

## 2020-02-04 DIAGNOSIS — F0390 Unspecified dementia without behavioral disturbance: Secondary | ICD-10-CM | POA: Diagnosis not present

## 2020-02-04 DIAGNOSIS — I5032 Chronic diastolic (congestive) heart failure: Secondary | ICD-10-CM | POA: Diagnosis not present

## 2020-02-04 DIAGNOSIS — E11319 Type 2 diabetes mellitus with unspecified diabetic retinopathy without macular edema: Secondary | ICD-10-CM | POA: Diagnosis not present

## 2020-02-04 DIAGNOSIS — E1151 Type 2 diabetes mellitus with diabetic peripheral angiopathy without gangrene: Secondary | ICD-10-CM | POA: Diagnosis not present

## 2020-02-04 DIAGNOSIS — J449 Chronic obstructive pulmonary disease, unspecified: Secondary | ICD-10-CM | POA: Diagnosis not present

## 2020-02-04 NOTE — Progress Notes (Signed)
COMMUNITY PALLIATIVE CARE SW NOTE  PATIENT NAME: [name] DOB: 09-29-1923 MRN: [id number]  PRIMARY CARE PROVIDER: Mayra Neer, MD  RESPONSIBLE PARTY:  Acct ID - Guarantor Home Phone Work Phone Relationship Acct Type  [id number] [name] Self P/F     [address], [place], [place] [address]     PLAN OF CARE and INTERVENTIONS:             1. GOALS OF CARE/ ADVANCE CARE PLANNING:  Goal is for patient to have her needs met. Patient is a DNR. 2. SOCIAL/EMOTIONAL/SPIRITUAL ASSESSMENT/ INTERVENTIONS: SW completed a visit with patient at the facility ([facility]). Patient was sitting in her wheelchair in the dayroom. She was awake, alert and oriented to self. She was humorous at times. She denied pain. Patient engaged in open dialogue with SW. Patient seems to be adjusting well and staff report that she was doing well. She is participating in activities. Patient is sleeping well and eating well. No weight loss noted. Patient is pleasantly confused, but engage.Patient was having intermittent tearfulness when she initially arrived at the facility, but since her medication change, she has not been crying. Patient requires assistance with ADL's. Staff verbalized no other concerns. SW provided supportive presence, active engagement, assessment of needs and comfort, consult with staff, reinforced access to palliative care support.  3. PATIENT/CAREGIVER EDUCATION/ COPING:  Patient seems to be adjusting well to the facility.  4. PERSONAL EMERGENCY PLAN:  Per facility protocol.  5. COMMUNITY RESOURCES COORDINATION/ HEALTH CARE NAVIGATION: Patient moved to memory care unit and seems to be adjusted well.  6. FINANCIAL/LEGAL CONCERNS/INTERVENTIONS:  No financial or legal issues.      SOCIAL HX:  Social History   Tobacco Use   Smoking status: Never Smoker   Smokeless tobacco: Never Used  Substance Use Topics   Alcohol use: No    CODE STATUS:  DNR ADVANCED DIRECTIVES: No MOST FORM COMPLETE:  No HOSPICE EDUCATION PROVIDED: No  PPS: Patient is pleasantly confused, but engaged. No pain issues. She utilizes her wheelchair to ambulate. She requires assistance with ADL's.   Duration of visit and documentation: 45 minutes      Katheren Puller, LCSW

## 2020-02-10 DIAGNOSIS — I5032 Chronic diastolic (congestive) heart failure: Secondary | ICD-10-CM | POA: Diagnosis not present

## 2020-02-10 DIAGNOSIS — I110 Hypertensive heart disease with heart failure: Secondary | ICD-10-CM | POA: Diagnosis not present

## 2020-02-10 DIAGNOSIS — E1151 Type 2 diabetes mellitus with diabetic peripheral angiopathy without gangrene: Secondary | ICD-10-CM | POA: Diagnosis not present

## 2020-02-10 DIAGNOSIS — F0390 Unspecified dementia without behavioral disturbance: Secondary | ICD-10-CM | POA: Diagnosis not present

## 2020-02-10 DIAGNOSIS — E11319 Type 2 diabetes mellitus with unspecified diabetic retinopathy without macular edema: Secondary | ICD-10-CM | POA: Diagnosis not present

## 2020-02-10 DIAGNOSIS — J449 Chronic obstructive pulmonary disease, unspecified: Secondary | ICD-10-CM | POA: Diagnosis not present

## 2020-02-12 DIAGNOSIS — J449 Chronic obstructive pulmonary disease, unspecified: Secondary | ICD-10-CM | POA: Diagnosis not present

## 2020-02-12 DIAGNOSIS — I5032 Chronic diastolic (congestive) heart failure: Secondary | ICD-10-CM | POA: Diagnosis not present

## 2020-02-12 DIAGNOSIS — E1151 Type 2 diabetes mellitus with diabetic peripheral angiopathy without gangrene: Secondary | ICD-10-CM | POA: Diagnosis not present

## 2020-02-12 DIAGNOSIS — I110 Hypertensive heart disease with heart failure: Secondary | ICD-10-CM | POA: Diagnosis not present

## 2020-02-12 DIAGNOSIS — F0390 Unspecified dementia without behavioral disturbance: Secondary | ICD-10-CM | POA: Diagnosis not present

## 2020-02-12 DIAGNOSIS — E11319 Type 2 diabetes mellitus with unspecified diabetic retinopathy without macular edema: Secondary | ICD-10-CM | POA: Diagnosis not present

## 2020-02-17 DIAGNOSIS — J449 Chronic obstructive pulmonary disease, unspecified: Secondary | ICD-10-CM | POA: Diagnosis not present

## 2020-02-17 DIAGNOSIS — F0390 Unspecified dementia without behavioral disturbance: Secondary | ICD-10-CM | POA: Diagnosis not present

## 2020-02-17 DIAGNOSIS — E1151 Type 2 diabetes mellitus with diabetic peripheral angiopathy without gangrene: Secondary | ICD-10-CM | POA: Diagnosis not present

## 2020-02-17 DIAGNOSIS — E11319 Type 2 diabetes mellitus with unspecified diabetic retinopathy without macular edema: Secondary | ICD-10-CM | POA: Diagnosis not present

## 2020-02-17 DIAGNOSIS — I110 Hypertensive heart disease with heart failure: Secondary | ICD-10-CM | POA: Diagnosis not present

## 2020-02-17 DIAGNOSIS — I5032 Chronic diastolic (congestive) heart failure: Secondary | ICD-10-CM | POA: Diagnosis not present

## 2020-02-19 DIAGNOSIS — E11319 Type 2 diabetes mellitus with unspecified diabetic retinopathy without macular edema: Secondary | ICD-10-CM | POA: Diagnosis not present

## 2020-02-19 DIAGNOSIS — I5032 Chronic diastolic (congestive) heart failure: Secondary | ICD-10-CM | POA: Diagnosis not present

## 2020-02-19 DIAGNOSIS — F0390 Unspecified dementia without behavioral disturbance: Secondary | ICD-10-CM | POA: Diagnosis not present

## 2020-02-19 DIAGNOSIS — J449 Chronic obstructive pulmonary disease, unspecified: Secondary | ICD-10-CM | POA: Diagnosis not present

## 2020-02-19 DIAGNOSIS — I110 Hypertensive heart disease with heart failure: Secondary | ICD-10-CM | POA: Diagnosis not present

## 2020-02-19 DIAGNOSIS — E1151 Type 2 diabetes mellitus with diabetic peripheral angiopathy without gangrene: Secondary | ICD-10-CM | POA: Diagnosis not present

## 2020-02-21 DIAGNOSIS — M810 Age-related osteoporosis without current pathological fracture: Secondary | ICD-10-CM | POA: Diagnosis not present

## 2020-02-21 DIAGNOSIS — H8110 Benign paroxysmal vertigo, unspecified ear: Secondary | ICD-10-CM | POA: Diagnosis not present

## 2020-02-21 DIAGNOSIS — E782 Mixed hyperlipidemia: Secondary | ICD-10-CM | POA: Diagnosis not present

## 2020-02-21 DIAGNOSIS — E11319 Type 2 diabetes mellitus with unspecified diabetic retinopathy without macular edema: Secondary | ICD-10-CM | POA: Diagnosis not present

## 2020-02-21 DIAGNOSIS — I700 Atherosclerosis of aorta: Secondary | ICD-10-CM | POA: Diagnosis not present

## 2020-02-21 DIAGNOSIS — M1991 Primary osteoarthritis, unspecified site: Secondary | ICD-10-CM | POA: Diagnosis not present

## 2020-02-21 DIAGNOSIS — I714 Abdominal aortic aneurysm, without rupture: Secondary | ICD-10-CM | POA: Diagnosis not present

## 2020-02-21 DIAGNOSIS — J449 Chronic obstructive pulmonary disease, unspecified: Secondary | ICD-10-CM | POA: Diagnosis not present

## 2020-02-21 DIAGNOSIS — R32 Unspecified urinary incontinence: Secondary | ICD-10-CM | POA: Diagnosis not present

## 2020-02-21 DIAGNOSIS — F0390 Unspecified dementia without behavioral disturbance: Secondary | ICD-10-CM | POA: Diagnosis not present

## 2020-02-21 DIAGNOSIS — Z9181 History of falling: Secondary | ICD-10-CM | POA: Diagnosis not present

## 2020-02-21 DIAGNOSIS — R202 Paresthesia of skin: Secondary | ICD-10-CM | POA: Diagnosis not present

## 2020-02-21 DIAGNOSIS — E1151 Type 2 diabetes mellitus with diabetic peripheral angiopathy without gangrene: Secondary | ICD-10-CM | POA: Diagnosis not present

## 2020-02-21 DIAGNOSIS — I5032 Chronic diastolic (congestive) heart failure: Secondary | ICD-10-CM | POA: Diagnosis not present

## 2020-02-21 DIAGNOSIS — I110 Hypertensive heart disease with heart failure: Secondary | ICD-10-CM | POA: Diagnosis not present

## 2020-02-21 DIAGNOSIS — B351 Tinea unguium: Secondary | ICD-10-CM | POA: Diagnosis not present

## 2020-02-21 DIAGNOSIS — Z8679 Personal history of other diseases of the circulatory system: Secondary | ICD-10-CM | POA: Diagnosis not present

## 2020-02-21 DIAGNOSIS — G8929 Other chronic pain: Secondary | ICD-10-CM | POA: Diagnosis not present

## 2020-02-21 DIAGNOSIS — Z794 Long term (current) use of insulin: Secondary | ICD-10-CM | POA: Diagnosis not present

## 2020-02-21 DIAGNOSIS — Z87440 Personal history of urinary (tract) infections: Secondary | ICD-10-CM | POA: Diagnosis not present

## 2020-03-04 ENCOUNTER — Non-Acute Institutional Stay: Payer: Medicare Other

## 2020-03-04 ENCOUNTER — Other Ambulatory Visit: Payer: Self-pay

## 2020-03-04 DIAGNOSIS — Z515 Encounter for palliative care: Secondary | ICD-10-CM

## 2020-03-05 DIAGNOSIS — E11319 Type 2 diabetes mellitus with unspecified diabetic retinopathy without macular edema: Secondary | ICD-10-CM | POA: Diagnosis not present

## 2020-03-05 DIAGNOSIS — J449 Chronic obstructive pulmonary disease, unspecified: Secondary | ICD-10-CM | POA: Diagnosis not present

## 2020-03-05 DIAGNOSIS — E1151 Type 2 diabetes mellitus with diabetic peripheral angiopathy without gangrene: Secondary | ICD-10-CM | POA: Diagnosis not present

## 2020-03-05 DIAGNOSIS — F0390 Unspecified dementia without behavioral disturbance: Secondary | ICD-10-CM | POA: Diagnosis not present

## 2020-03-05 DIAGNOSIS — I110 Hypertensive heart disease with heart failure: Secondary | ICD-10-CM | POA: Diagnosis not present

## 2020-03-05 DIAGNOSIS — I5032 Chronic diastolic (congestive) heart failure: Secondary | ICD-10-CM | POA: Diagnosis not present

## 2020-03-06 NOTE — Progress Notes (Signed)
COMMUNITY PALLIATIVE CARE SW NOTE  PATIENT NAME: [name] DOB: Apr 15, 1924 MRN: [id number]  PRIMARY CARE PROVIDER: Mayra Neer, MD  RESPONSIBLE PARTY:  Acct ID - Guarantor Home Phone Work Phone Relationship Acct Type  [id number] [name] Self P/F     [address], [place], [place] [address]     PLAN OF CARE and INTERVENTIONS:             1. GOALS OF CARE/ ADVANCE CARE PLANNING:  Goal is for patient to have her needs met in the current setting. Patient is a DNR. 2. SOCIAL/EMOTIONAL/SPIRITUAL ASSESSMENT/ INTERVENTIONS:  SW completed a face-to-face visit with patient at the facility ([facility]). Patient was present in the dayroom. She was sleeping. She aroused to verbal prompts, but appeared to sleepy. Patient was not as talkative as the previous visit with her, but she was responsive to simple yes/no questions. Patient denied pain. The other verbalizations were scattered. Patient continues to participate in activities. She is sleeping and eating well. No weight loss noted. Her tearfulness and verbalized desire to go home has significantly decreased. She is dependent for personal care needs. Staff was consulted. SW provided supportive presence, verbal prompts, assessment of needs and comfort. SW will provide ongoing assessment of needs and comfort of patient.and provide support as needed.   PATIENT/CAREGIVER EDUCATION/ COPING: Patient is alert and oriented to self. She is coping well and adjusting tot he facility.   3. PERSONAL EMERGENCY PLAN:  Per facility protocol.  4. COMMUNITY RESOURCES COORDINATION/ HEALTH CARE NAVIGATION:  Patient has access to an activities program.  5. FINANCIAL/LEGAL CONCERNS/INTERVENTIONS:  None.      SOCIAL HX:  Social History   Tobacco Use  . Smoking status: Never Smoker  . Smokeless tobacco: Never Used  Substance Use Topics  . Alcohol use: No    CODE STATUS: DNR ADVANCED DIRECTIVES: No MOST FORM  COMPLETE:  No HOSPICE EDUCATION PROVIDED: No  PPS: Patient is pleasantly confused, but engaged. No pain issues. She utilizes her wheelchair to ambulate. She requires assistance with ADL's.    Duration of visit and documentation: 45 minutes    Katheren Puller, LCSW

## 2020-03-10 DIAGNOSIS — E1151 Type 2 diabetes mellitus with diabetic peripheral angiopathy without gangrene: Secondary | ICD-10-CM | POA: Diagnosis not present

## 2020-03-10 DIAGNOSIS — I5032 Chronic diastolic (congestive) heart failure: Secondary | ICD-10-CM | POA: Diagnosis not present

## 2020-03-10 DIAGNOSIS — I110 Hypertensive heart disease with heart failure: Secondary | ICD-10-CM | POA: Diagnosis not present

## 2020-03-10 DIAGNOSIS — E11319 Type 2 diabetes mellitus with unspecified diabetic retinopathy without macular edema: Secondary | ICD-10-CM | POA: Diagnosis not present

## 2020-03-10 DIAGNOSIS — J449 Chronic obstructive pulmonary disease, unspecified: Secondary | ICD-10-CM | POA: Diagnosis not present

## 2020-03-10 DIAGNOSIS — F0390 Unspecified dementia without behavioral disturbance: Secondary | ICD-10-CM | POA: Diagnosis not present

## 2020-03-17 DIAGNOSIS — I110 Hypertensive heart disease with heart failure: Secondary | ICD-10-CM | POA: Diagnosis not present

## 2020-03-17 DIAGNOSIS — E1151 Type 2 diabetes mellitus with diabetic peripheral angiopathy without gangrene: Secondary | ICD-10-CM | POA: Diagnosis not present

## 2020-03-17 DIAGNOSIS — E11319 Type 2 diabetes mellitus with unspecified diabetic retinopathy without macular edema: Secondary | ICD-10-CM | POA: Diagnosis not present

## 2020-03-17 DIAGNOSIS — J449 Chronic obstructive pulmonary disease, unspecified: Secondary | ICD-10-CM | POA: Diagnosis not present

## 2020-03-17 DIAGNOSIS — I5032 Chronic diastolic (congestive) heart failure: Secondary | ICD-10-CM | POA: Diagnosis not present

## 2020-03-17 DIAGNOSIS — F0390 Unspecified dementia without behavioral disturbance: Secondary | ICD-10-CM | POA: Diagnosis not present

## 2020-03-18 DIAGNOSIS — N390 Urinary tract infection, site not specified: Secondary | ICD-10-CM | POA: Diagnosis not present

## 2020-03-20 DIAGNOSIS — J449 Chronic obstructive pulmonary disease, unspecified: Secondary | ICD-10-CM | POA: Diagnosis not present

## 2020-03-20 DIAGNOSIS — E1151 Type 2 diabetes mellitus with diabetic peripheral angiopathy without gangrene: Secondary | ICD-10-CM | POA: Diagnosis not present

## 2020-03-20 DIAGNOSIS — E11319 Type 2 diabetes mellitus with unspecified diabetic retinopathy without macular edema: Secondary | ICD-10-CM | POA: Diagnosis not present

## 2020-03-20 DIAGNOSIS — F0390 Unspecified dementia without behavioral disturbance: Secondary | ICD-10-CM | POA: Diagnosis not present

## 2020-03-20 DIAGNOSIS — I110 Hypertensive heart disease with heart failure: Secondary | ICD-10-CM | POA: Diagnosis not present

## 2020-03-20 DIAGNOSIS — I5032 Chronic diastolic (congestive) heart failure: Secondary | ICD-10-CM | POA: Diagnosis not present

## 2020-03-22 DIAGNOSIS — M810 Age-related osteoporosis without current pathological fracture: Secondary | ICD-10-CM | POA: Diagnosis not present

## 2020-03-22 DIAGNOSIS — I110 Hypertensive heart disease with heart failure: Secondary | ICD-10-CM | POA: Diagnosis not present

## 2020-03-22 DIAGNOSIS — I700 Atherosclerosis of aorta: Secondary | ICD-10-CM | POA: Diagnosis not present

## 2020-03-22 DIAGNOSIS — F0390 Unspecified dementia without behavioral disturbance: Secondary | ICD-10-CM | POA: Diagnosis not present

## 2020-03-22 DIAGNOSIS — J449 Chronic obstructive pulmonary disease, unspecified: Secondary | ICD-10-CM | POA: Diagnosis not present

## 2020-03-22 DIAGNOSIS — E782 Mixed hyperlipidemia: Secondary | ICD-10-CM | POA: Diagnosis not present

## 2020-03-22 DIAGNOSIS — B351 Tinea unguium: Secondary | ICD-10-CM | POA: Diagnosis not present

## 2020-03-22 DIAGNOSIS — E11319 Type 2 diabetes mellitus with unspecified diabetic retinopathy without macular edema: Secondary | ICD-10-CM | POA: Diagnosis not present

## 2020-03-22 DIAGNOSIS — I714 Abdominal aortic aneurysm, without rupture: Secondary | ICD-10-CM | POA: Diagnosis not present

## 2020-03-22 DIAGNOSIS — R32 Unspecified urinary incontinence: Secondary | ICD-10-CM | POA: Diagnosis not present

## 2020-03-22 DIAGNOSIS — Z8679 Personal history of other diseases of the circulatory system: Secondary | ICD-10-CM | POA: Diagnosis not present

## 2020-03-22 DIAGNOSIS — Z87440 Personal history of urinary (tract) infections: Secondary | ICD-10-CM | POA: Diagnosis not present

## 2020-03-22 DIAGNOSIS — E1151 Type 2 diabetes mellitus with diabetic peripheral angiopathy without gangrene: Secondary | ICD-10-CM | POA: Diagnosis not present

## 2020-03-22 DIAGNOSIS — Z9181 History of falling: Secondary | ICD-10-CM | POA: Diagnosis not present

## 2020-03-22 DIAGNOSIS — H8110 Benign paroxysmal vertigo, unspecified ear: Secondary | ICD-10-CM | POA: Diagnosis not present

## 2020-03-22 DIAGNOSIS — R202 Paresthesia of skin: Secondary | ICD-10-CM | POA: Diagnosis not present

## 2020-03-22 DIAGNOSIS — M1991 Primary osteoarthritis, unspecified site: Secondary | ICD-10-CM | POA: Diagnosis not present

## 2020-03-22 DIAGNOSIS — Z794 Long term (current) use of insulin: Secondary | ICD-10-CM | POA: Diagnosis not present

## 2020-03-22 DIAGNOSIS — I5032 Chronic diastolic (congestive) heart failure: Secondary | ICD-10-CM | POA: Diagnosis not present

## 2020-04-01 DIAGNOSIS — E11319 Type 2 diabetes mellitus with unspecified diabetic retinopathy without macular edema: Secondary | ICD-10-CM | POA: Diagnosis not present

## 2020-04-01 DIAGNOSIS — I5032 Chronic diastolic (congestive) heart failure: Secondary | ICD-10-CM | POA: Diagnosis not present

## 2020-04-01 DIAGNOSIS — J449 Chronic obstructive pulmonary disease, unspecified: Secondary | ICD-10-CM | POA: Diagnosis not present

## 2020-04-01 DIAGNOSIS — F0390 Unspecified dementia without behavioral disturbance: Secondary | ICD-10-CM | POA: Diagnosis not present

## 2020-04-01 DIAGNOSIS — E1151 Type 2 diabetes mellitus with diabetic peripheral angiopathy without gangrene: Secondary | ICD-10-CM | POA: Diagnosis not present

## 2020-04-01 DIAGNOSIS — I110 Hypertensive heart disease with heart failure: Secondary | ICD-10-CM | POA: Diagnosis not present

## 2020-04-06 DIAGNOSIS — F0390 Unspecified dementia without behavioral disturbance: Secondary | ICD-10-CM | POA: Diagnosis not present

## 2020-04-06 DIAGNOSIS — I5032 Chronic diastolic (congestive) heart failure: Secondary | ICD-10-CM | POA: Diagnosis not present

## 2020-04-06 DIAGNOSIS — E11319 Type 2 diabetes mellitus with unspecified diabetic retinopathy without macular edema: Secondary | ICD-10-CM | POA: Diagnosis not present

## 2020-04-06 DIAGNOSIS — E1151 Type 2 diabetes mellitus with diabetic peripheral angiopathy without gangrene: Secondary | ICD-10-CM | POA: Diagnosis not present

## 2020-04-06 DIAGNOSIS — I110 Hypertensive heart disease with heart failure: Secondary | ICD-10-CM | POA: Diagnosis not present

## 2020-04-06 DIAGNOSIS — J449 Chronic obstructive pulmonary disease, unspecified: Secondary | ICD-10-CM | POA: Diagnosis not present

## 2020-04-15 DIAGNOSIS — E1151 Type 2 diabetes mellitus with diabetic peripheral angiopathy without gangrene: Secondary | ICD-10-CM | POA: Diagnosis not present

## 2020-04-15 DIAGNOSIS — I110 Hypertensive heart disease with heart failure: Secondary | ICD-10-CM | POA: Diagnosis not present

## 2020-04-15 DIAGNOSIS — F0390 Unspecified dementia without behavioral disturbance: Secondary | ICD-10-CM | POA: Diagnosis not present

## 2020-04-15 DIAGNOSIS — E11319 Type 2 diabetes mellitus with unspecified diabetic retinopathy without macular edema: Secondary | ICD-10-CM | POA: Diagnosis not present

## 2020-04-15 DIAGNOSIS — J449 Chronic obstructive pulmonary disease, unspecified: Secondary | ICD-10-CM | POA: Diagnosis not present

## 2020-04-15 DIAGNOSIS — I5032 Chronic diastolic (congestive) heart failure: Secondary | ICD-10-CM | POA: Diagnosis not present

## 2020-04-20 ENCOUNTER — Other Ambulatory Visit: Payer: Self-pay

## 2020-04-20 ENCOUNTER — Non-Acute Institutional Stay: Payer: Medicare Other | Admitting: *Deleted

## 2020-04-20 DIAGNOSIS — Z515 Encounter for palliative care: Secondary | ICD-10-CM

## 2020-04-20 NOTE — Progress Notes (Signed)
COMMUNITY PALLIATIVE CARE RN NOTE  PATIENT NAME: [name] DOB: 05/07/1924 MRN: [id number]  PRIMARY CARE PROVIDER: Mayra Neer, MD  RESPONSIBLE PARTY: [name] (daughter) Acct ID - Guarantor Home Phone Work Phone Relationship Acct Type  [id number] [name], [name] Self P/F     [place], [place], [place] [address]   Covid-19 Pre-screening Negative  PLAN OF CARE and INTERVENTION:  1. ADVANCE CARE PLANNING/GOALS OF CARE: Goal is for patient to remain at current AL facility ([facility]). She has a DNR. 2. PATIENT/CAREGIVER EDUCATION: Symptom management, safe mobility/transfers, fall prevention, s/s of infection 3. DISEASE STATUS: Met with patient in the common area at the facility. Patient is sitting up in her wheelchair awake and alert, visiting with other residents. I notice increased confusion, but she is able to answer questions and make her needs known. She is able to engage in conversation but at times is not in relation to question that was asked. She denies pain. Respirations are even, regular and unlabored. She is at high risk for falls. Last fall was about 3 weeks ago. Facility staff reports that she has had several falls, usually while patient is trying to get to the bathroom, or in the bathroom. No apparent injuries per staff. She is mainly propelling herself in her wheelchair. She used to be able to ambulate using a walker about 3 months ago. She requires assistance with all ADLs except for feeding. Her intake is good. She denies dysphagia. It is apparent that she has gained weight, mainly noted in her abdominal region. She denies pain or abdominal discomfort. She has been having regular bowel movements. She is incontinent of both bowel and bladder. I spoke with her daughter, [name], as she was coming in to visit with patient. She says that patient has a rash on her bottom and perineal area. Daughter is applying Desitin daily when she  comes to visit. Her bottom is clearing up, but her perineal area just recently started breaking out in this rash. She is appreciative of the monthly visits from Palliative care. Will continue to monitor.   HISTORY OF PRESENT ILLNESS:  This is a 86 yo female with a past medical history of dementia, CHF, essential hypertension, DM II, syncope and hyperlipidemia. Palliative care team continues to follow patient. Will visit patient monthly and PRN.   CODE STATUS: DNR  ADVANCED DIRECTIVES: N MOST FORM: no PPS: weak 40%   PHYSICAL EXAM:   LUNGS: clear to auscultation  CARDIAC: Cor RRR EXTREMITIES: Trace lower extremity edema SKIN: Daughter reports rash on bottom and perineal area; desitin being applied daily  NEURO: Alert and oriented to self, increased confusion, generalized weakness, propels self in wheelchair   (Duration of visit and documentation 60 minutes)   Daryl Eastern, RN BSN

## 2020-04-21 DIAGNOSIS — B351 Tinea unguium: Secondary | ICD-10-CM | POA: Diagnosis not present

## 2020-04-21 DIAGNOSIS — I5032 Chronic diastolic (congestive) heart failure: Secondary | ICD-10-CM | POA: Diagnosis not present

## 2020-04-21 DIAGNOSIS — I700 Atherosclerosis of aorta: Secondary | ICD-10-CM | POA: Diagnosis not present

## 2020-04-21 DIAGNOSIS — R32 Unspecified urinary incontinence: Secondary | ICD-10-CM | POA: Diagnosis not present

## 2020-04-21 DIAGNOSIS — M810 Age-related osteoporosis without current pathological fracture: Secondary | ICD-10-CM | POA: Diagnosis not present

## 2020-04-21 DIAGNOSIS — F0390 Unspecified dementia without behavioral disturbance: Secondary | ICD-10-CM | POA: Diagnosis not present

## 2020-04-21 DIAGNOSIS — Z9181 History of falling: Secondary | ICD-10-CM | POA: Diagnosis not present

## 2020-04-21 DIAGNOSIS — J449 Chronic obstructive pulmonary disease, unspecified: Secondary | ICD-10-CM | POA: Diagnosis not present

## 2020-04-21 DIAGNOSIS — E1151 Type 2 diabetes mellitus with diabetic peripheral angiopathy without gangrene: Secondary | ICD-10-CM | POA: Diagnosis not present

## 2020-04-21 DIAGNOSIS — E11319 Type 2 diabetes mellitus with unspecified diabetic retinopathy without macular edema: Secondary | ICD-10-CM | POA: Diagnosis not present

## 2020-04-21 DIAGNOSIS — I110 Hypertensive heart disease with heart failure: Secondary | ICD-10-CM | POA: Diagnosis not present

## 2020-04-21 DIAGNOSIS — E782 Mixed hyperlipidemia: Secondary | ICD-10-CM | POA: Diagnosis not present

## 2020-04-21 DIAGNOSIS — Z794 Long term (current) use of insulin: Secondary | ICD-10-CM | POA: Diagnosis not present

## 2020-04-21 DIAGNOSIS — Z87440 Personal history of urinary (tract) infections: Secondary | ICD-10-CM | POA: Diagnosis not present

## 2020-04-21 DIAGNOSIS — H8110 Benign paroxysmal vertigo, unspecified ear: Secondary | ICD-10-CM | POA: Diagnosis not present

## 2020-04-21 DIAGNOSIS — I714 Abdominal aortic aneurysm, without rupture: Secondary | ICD-10-CM | POA: Diagnosis not present

## 2020-04-21 DIAGNOSIS — Z8679 Personal history of other diseases of the circulatory system: Secondary | ICD-10-CM | POA: Diagnosis not present

## 2020-04-21 DIAGNOSIS — M1991 Primary osteoarthritis, unspecified site: Secondary | ICD-10-CM | POA: Diagnosis not present

## 2020-04-21 DIAGNOSIS — R202 Paresthesia of skin: Secondary | ICD-10-CM | POA: Diagnosis not present

## 2020-04-22 DIAGNOSIS — E11319 Type 2 diabetes mellitus with unspecified diabetic retinopathy without macular edema: Secondary | ICD-10-CM | POA: Diagnosis not present

## 2020-04-22 DIAGNOSIS — F0390 Unspecified dementia without behavioral disturbance: Secondary | ICD-10-CM | POA: Diagnosis not present

## 2020-04-22 DIAGNOSIS — I5032 Chronic diastolic (congestive) heart failure: Secondary | ICD-10-CM | POA: Diagnosis not present

## 2020-04-22 DIAGNOSIS — E1151 Type 2 diabetes mellitus with diabetic peripheral angiopathy without gangrene: Secondary | ICD-10-CM | POA: Diagnosis not present

## 2020-04-22 DIAGNOSIS — J449 Chronic obstructive pulmonary disease, unspecified: Secondary | ICD-10-CM | POA: Diagnosis not present

## 2020-04-22 DIAGNOSIS — I110 Hypertensive heart disease with heart failure: Secondary | ICD-10-CM | POA: Diagnosis not present

## 2020-04-23 DIAGNOSIS — Z23 Encounter for immunization: Secondary | ICD-10-CM | POA: Diagnosis not present

## 2020-04-27 DIAGNOSIS — N390 Urinary tract infection, site not specified: Secondary | ICD-10-CM | POA: Diagnosis not present

## 2020-04-27 DIAGNOSIS — Z794 Long term (current) use of insulin: Secondary | ICD-10-CM | POA: Diagnosis not present

## 2020-04-27 DIAGNOSIS — I10 Essential (primary) hypertension: Secondary | ICD-10-CM | POA: Diagnosis not present

## 2020-04-27 DIAGNOSIS — K5901 Slow transit constipation: Secondary | ICD-10-CM | POA: Diagnosis not present

## 2020-04-27 DIAGNOSIS — F0280 Dementia in other diseases classified elsewhere without behavioral disturbance: Secondary | ICD-10-CM | POA: Diagnosis not present

## 2020-04-27 DIAGNOSIS — E119 Type 2 diabetes mellitus without complications: Secondary | ICD-10-CM | POA: Diagnosis not present

## 2020-04-27 DIAGNOSIS — F411 Generalized anxiety disorder: Secondary | ICD-10-CM | POA: Diagnosis not present

## 2020-04-27 DIAGNOSIS — G301 Alzheimer's disease with late onset: Secondary | ICD-10-CM | POA: Diagnosis not present

## 2020-04-28 ENCOUNTER — Non-Acute Institutional Stay: Payer: Medicare Other | Admitting: *Deleted

## 2020-04-28 ENCOUNTER — Other Ambulatory Visit: Payer: Self-pay

## 2020-04-28 DIAGNOSIS — E1151 Type 2 diabetes mellitus with diabetic peripheral angiopathy without gangrene: Secondary | ICD-10-CM | POA: Diagnosis not present

## 2020-04-28 DIAGNOSIS — F0390 Unspecified dementia without behavioral disturbance: Secondary | ICD-10-CM | POA: Diagnosis not present

## 2020-04-28 DIAGNOSIS — I5032 Chronic diastolic (congestive) heart failure: Secondary | ICD-10-CM | POA: Diagnosis not present

## 2020-04-28 DIAGNOSIS — I110 Hypertensive heart disease with heart failure: Secondary | ICD-10-CM | POA: Diagnosis not present

## 2020-04-28 DIAGNOSIS — Z515 Encounter for palliative care: Secondary | ICD-10-CM

## 2020-04-28 DIAGNOSIS — E11319 Type 2 diabetes mellitus with unspecified diabetic retinopathy without macular edema: Secondary | ICD-10-CM | POA: Diagnosis not present

## 2020-04-28 DIAGNOSIS — J449 Chronic obstructive pulmonary disease, unspecified: Secondary | ICD-10-CM | POA: Diagnosis not present

## 2020-04-30 NOTE — Progress Notes (Signed)
COMMUNITY PALLIATIVE CARE RN NOTE  PATIENT NAME: [name] DOB: Jun 22, 1924 MRN: [id number]  PRIMARY CARE PROVIDER: Mayra Neer, MD  RESPONSIBLE PARTY: [name] (daughter) Acct ID - Guarantor Home Phone Work Phone Relationship Acct Type  [id number] TRIANA, [phone] Self P/F     [place], [place], [place] [address]   Covid-19 Pre-screening Negative  PLAN OF CARE and INTERVENTION:  1. ADVANCE CARE PLANNING/GOALS OF CARE: Goal is for patient to remain at current AL facility ([place] unit). She is a DNR. 2. PATIENT/CAREGIVER EDUCATION: Symptom management, safe mobility/transfers, s/s of infection 3. DISEASE STATUS: Met with patient in the common area at the facility. She is sitting at the table with other residents and staff. She is alert and oriented to self. She is able to answer some simple questions. She is confused. She denies pain. No shortness of breath noted or reported. Staff reports that patient does often try to get up from her wheelchair. They keep her in the common area in order to supervise for safety purposes. She requires assistance with all ADLs, except feeding. Staff also reports that she is becoming progressively weaker and often times she requires 2-3 person assistance with standing and transfers from her wheelchair. She is incontinent of both bowel and bladder. They report that she urinates often. They take patient to the bathroom every 2 hours and her adult brief is still wet when they take her. She has redness on her bottom and perineal area. Desitin in being applied daily to help. She has a good appetite. She is on a regular diet with thin liquids. No dysphagia reported. Chart reviewed. Will continue to monitor.   HISTORY OF PRESENT ILLNESS: This is a 86 yo female with a past medical history of dementia, CHF, essential hypertension, DM II, syncope and hyperlipidemia. Palliative care team continues to follow patient.  Will visit patient monthly and PRN.    CODE STATUS: DNR ADVANCED DIRECTIVES: Y MOST FORM: no PPS: 30%   PHYSICAL EXAM:   LUNGS: clear to auscultation  CARDIAC: Cor RRR EXTREMITIES: No edema SKIN: rash to bottom/perineal area  NEURO: Alert and oriented to self, confused, increased generalized weakness, wheelchair-bound   (Duration of visit and documentation 45 minutes)   Daryl Eastern, RN BSN

## 2020-05-04 DIAGNOSIS — E1151 Type 2 diabetes mellitus with diabetic peripheral angiopathy without gangrene: Secondary | ICD-10-CM | POA: Diagnosis not present

## 2020-05-04 DIAGNOSIS — E11319 Type 2 diabetes mellitus with unspecified diabetic retinopathy without macular edema: Secondary | ICD-10-CM | POA: Diagnosis not present

## 2020-05-04 DIAGNOSIS — I5032 Chronic diastolic (congestive) heart failure: Secondary | ICD-10-CM | POA: Diagnosis not present

## 2020-05-04 DIAGNOSIS — I110 Hypertensive heart disease with heart failure: Secondary | ICD-10-CM | POA: Diagnosis not present

## 2020-05-04 DIAGNOSIS — J449 Chronic obstructive pulmonary disease, unspecified: Secondary | ICD-10-CM | POA: Diagnosis not present

## 2020-05-04 DIAGNOSIS — F0390 Unspecified dementia without behavioral disturbance: Secondary | ICD-10-CM | POA: Diagnosis not present

## 2020-05-13 DIAGNOSIS — I110 Hypertensive heart disease with heart failure: Secondary | ICD-10-CM | POA: Diagnosis not present

## 2020-05-13 DIAGNOSIS — E1151 Type 2 diabetes mellitus with diabetic peripheral angiopathy without gangrene: Secondary | ICD-10-CM | POA: Diagnosis not present

## 2020-05-13 DIAGNOSIS — I5032 Chronic diastolic (congestive) heart failure: Secondary | ICD-10-CM | POA: Diagnosis not present

## 2020-05-13 DIAGNOSIS — J449 Chronic obstructive pulmonary disease, unspecified: Secondary | ICD-10-CM | POA: Diagnosis not present

## 2020-05-13 DIAGNOSIS — E11319 Type 2 diabetes mellitus with unspecified diabetic retinopathy without macular edema: Secondary | ICD-10-CM | POA: Diagnosis not present

## 2020-05-13 DIAGNOSIS — F0390 Unspecified dementia without behavioral disturbance: Secondary | ICD-10-CM | POA: Diagnosis not present

## 2020-05-14 DIAGNOSIS — Z23 Encounter for immunization: Secondary | ICD-10-CM | POA: Diagnosis not present

## 2020-05-25 ENCOUNTER — Telehealth: Payer: Self-pay | Admitting: *Deleted

## 2020-05-25 NOTE — Telephone Encounter (Signed)
3:30p Received a call from patient's daughter, [name], stating she has noticed a significant decline in patient over the past few weeks. She is no longer verbal and motions for things that she wants. Her appetite has decreased and she requires 2 person assistance with transfers and unable to bear any weight. She is also now fully incontinent of both bowel and bladder. She is requesting a RN visit this week to discuss options. Visit scheduled for 12/1.

## 2020-05-27 ENCOUNTER — Other Ambulatory Visit: Payer: Self-pay

## 2020-05-27 ENCOUNTER — Non-Acute Institutional Stay: Payer: Medicare Other | Admitting: *Deleted

## 2020-05-27 DIAGNOSIS — Z515 Encounter for palliative care: Secondary | ICD-10-CM

## 2020-05-27 NOTE — Progress Notes (Signed)
COMMUNITY PALLIATIVE CARE RN NOTE  PATIENT NAME: [name] DOB: 04-08-1924 MRN: [id number]  PRIMARY CARE PROVIDER: Mayra Neer, MD  RESPONSIBLE PARTY: [name] (daughter) Acct ID - Guarantor Home Phone Work Phone Relationship Acct Type  [id number] DERYL, [phone] Self P/F     [place], [place], [place] [address]   Covid-19 Pre-screening Negative  PLAN OF CARE and INTERVENTION:  1. ADVANCE CARE PLANNING/GOALS OF CARE: Goal is for patient to transition to hospice services. Family requesting transfer from [facility] to [facility] for end of life when hospice consult order is obtained 2. PATIENT/CAREGIVER EDUCATION: Hospice education, safe transfers, s/s of disease progression, comfort care 3. DISEASE STATUS: Met with patient's daughter, [name], and her two sons at [facility]) facility. [name] states that patient has been on a steady decline for the past 2-3 weeks. During my visit on 04/28/20, patient was alert, still able to communicate (even though confused), sit upright in her wheelchair and feed herself. Upon arrival today, she is sitting up in her wheelchair with her head in a downward position. She is unable to lift her head up. I asked her to open her eyes, she tried but could not. She is completely non-verbal and lethargic.  I assisted staff in taking her to her room to provide incontinent care. Patient required 3 person assistance with transfer. She is unable to bear any weight. She is total care now with all ADLs. Her eyes remained closed the entire time. She is completely incontinent of both bowel and bladder and wears adult briefs. She has a large, fungal appearing rash on her perineal area that has spread down into her inner thigh region. It is being treated with anti-fungal cream. Staff decided to leave patient in bed d/t extreme weakness. No physical indicators of pain noted or shortness of breath. She appears comfortable.  [name] states that patient is not eating, but has been able to sip water through a straw up until yesterday. Staff states that they try to feed her, but she pockets food or spits it out and had to be prompted continually to wake up. She has been receiving her medications crushed in applesauce. In my opinion patient is hospice appropriate. Spoke with family and they would like to transition to hospice care. Once admitted to hospice services they would also like patient to be transferred to [facility] as patient currently has a semi-private room. I spoke with [place] who reached out to her PCP, Dr. Reymundo Poll for a hospice order. She says that he is currently driving so will call [facility]'s hospice referral center to give a verbal order. Silvestre Moment has contacted patient's daughter, [name], and made her aware of this as well.     HISTORY OF PRESENT ILLNESS: This is a 86 yo female with a past medical history of dementia, CHF, essential hypertension, DM II, syncope and hyperlipidemia. Palliative care team continues to follow patient but has had facility request a hospice consult from Dr. Fredderick Phenix.     CODE STATUS: DNR ADVANCED DIRECTIVES: Y MOST FORM: no PPS: 20%   PHYSICAL EXAM:   LUNGS: Breathing is regular and unlabored CARDIAC: Cor irreg, irreg RRR EXTREMITIES: Trace bilateral lower extremity edema SKIN: Bright red, fungal rash to perineal region and inner thighs  NEURO: Minimally responsive, non-verbal, lethargic, progressive generalized weakness, total care   (Duration of visit and documentation 60 minutes)   Daryl Eastern, RN BSN

## 2020-05-28 DIAGNOSIS — J449 Chronic obstructive pulmonary disease, unspecified: Secondary | ICD-10-CM | POA: Diagnosis not present

## 2020-05-28 DIAGNOSIS — I5030 Unspecified diastolic (congestive) heart failure: Secondary | ICD-10-CM | POA: Diagnosis not present

## 2020-05-28 DIAGNOSIS — G309 Alzheimer's disease, unspecified: Secondary | ICD-10-CM | POA: Diagnosis not present

## 2020-05-28 DIAGNOSIS — Z741 Need for assistance with personal care: Secondary | ICD-10-CM | POA: Diagnosis not present

## 2020-05-28 DIAGNOSIS — I700 Atherosclerosis of aorta: Secondary | ICD-10-CM | POA: Diagnosis not present

## 2020-05-28 DIAGNOSIS — E785 Hyperlipidemia, unspecified: Secondary | ICD-10-CM | POA: Diagnosis not present

## 2020-05-28 DIAGNOSIS — Z6829 Body mass index (BMI) 29.0-29.9, adult: Secondary | ICD-10-CM | POA: Diagnosis not present

## 2020-05-28 DIAGNOSIS — B372 Candidiasis of skin and nail: Secondary | ICD-10-CM | POA: Diagnosis not present

## 2020-05-28 DIAGNOSIS — M810 Age-related osteoporosis without current pathological fracture: Secondary | ICD-10-CM | POA: Diagnosis not present

## 2020-05-28 DIAGNOSIS — R32 Unspecified urinary incontinence: Secondary | ICD-10-CM | POA: Diagnosis not present

## 2020-05-28 DIAGNOSIS — F0280 Dementia in other diseases classified elsewhere without behavioral disturbance: Secondary | ICD-10-CM | POA: Diagnosis not present

## 2020-05-28 DIAGNOSIS — E119 Type 2 diabetes mellitus without complications: Secondary | ICD-10-CM | POA: Diagnosis not present

## 2020-05-28 DIAGNOSIS — I110 Hypertensive heart disease with heart failure: Secondary | ICD-10-CM | POA: Diagnosis not present

## 2020-05-28 DIAGNOSIS — R296 Repeated falls: Secondary | ICD-10-CM | POA: Diagnosis not present

## 2020-05-28 DIAGNOSIS — Z952 Presence of prosthetic heart valve: Secondary | ICD-10-CM | POA: Diagnosis not present

## 2020-05-29 DIAGNOSIS — F0280 Dementia in other diseases classified elsewhere without behavioral disturbance: Secondary | ICD-10-CM | POA: Diagnosis not present

## 2020-05-29 DIAGNOSIS — J449 Chronic obstructive pulmonary disease, unspecified: Secondary | ICD-10-CM | POA: Diagnosis not present

## 2020-05-29 DIAGNOSIS — R296 Repeated falls: Secondary | ICD-10-CM | POA: Diagnosis not present

## 2020-05-29 DIAGNOSIS — G309 Alzheimer's disease, unspecified: Secondary | ICD-10-CM | POA: Diagnosis not present

## 2020-05-29 DIAGNOSIS — B372 Candidiasis of skin and nail: Secondary | ICD-10-CM | POA: Diagnosis not present

## 2020-05-29 DIAGNOSIS — E119 Type 2 diabetes mellitus without complications: Secondary | ICD-10-CM | POA: Diagnosis not present

## 2020-06-27 DEATH — deceased
